# Patient Record
Sex: Female | Born: 1935 | Race: White | Hispanic: No | Smoking: Never smoker
Health system: Southern US, Community
[De-identification: ages and names within clinical notes are randomized; demographics above are authoritative.]

## PROBLEM LIST (undated history)

## (undated) DIAGNOSIS — K5790 Diverticulosis of intestine, part unspecified, without perforation or abscess without bleeding: Secondary | ICD-10-CM

## (undated) DIAGNOSIS — C801 Malignant (primary) neoplasm, unspecified: Secondary | ICD-10-CM

## (undated) DIAGNOSIS — J189 Pneumonia, unspecified organism: Secondary | ICD-10-CM

## (undated) DIAGNOSIS — D649 Anemia, unspecified: Secondary | ICD-10-CM

## (undated) DIAGNOSIS — I10 Essential (primary) hypertension: Secondary | ICD-10-CM

## (undated) DIAGNOSIS — E785 Hyperlipidemia, unspecified: Secondary | ICD-10-CM

## (undated) DIAGNOSIS — J302 Other seasonal allergic rhinitis: Secondary | ICD-10-CM

## (undated) DIAGNOSIS — K8020 Calculus of gallbladder without cholecystitis without obstruction: Secondary | ICD-10-CM

## (undated) DIAGNOSIS — M1990 Unspecified osteoarthritis, unspecified site: Secondary | ICD-10-CM

## (undated) DIAGNOSIS — Z973 Presence of spectacles and contact lenses: Secondary | ICD-10-CM

## (undated) DIAGNOSIS — F329 Major depressive disorder, single episode, unspecified: Secondary | ICD-10-CM

## (undated) DIAGNOSIS — F32A Depression, unspecified: Secondary | ICD-10-CM

## (undated) HISTORY — PX: ABDOMINAL HYSTERECTOMY: SHX81

## (undated) HISTORY — DX: Calculus of gallbladder without cholecystitis without obstruction: K80.20

## (undated) HISTORY — DX: Unspecified osteoarthritis, unspecified site: M19.90

## (undated) HISTORY — DX: Pneumonia, unspecified organism: J18.9

## (undated) HISTORY — PX: APPENDECTOMY: SHX54

## (undated) HISTORY — DX: Hyperlipidemia, unspecified: E78.5

## (undated) HISTORY — DX: Diverticulosis of intestine, part unspecified, without perforation or abscess without bleeding: K57.90

## (undated) HISTORY — PX: TONSILECTOMY, ADENOIDECTOMY, BILATERAL MYRINGOTOMY AND TUBES: SHX2538

---

## 2005-01-19 LAB — HM COLONOSCOPY: HM Colonoscopy: NORMAL

## 2006-01-29 LAB — HM COLONOSCOPY: HM Colonoscopy: NORMAL

## 2006-02-18 HISTORY — PX: COLONOSCOPY: SHX174

## 2006-05-13 ENCOUNTER — Ambulatory Visit: Payer: Self-pay | Admitting: Internal Medicine

## 2006-06-24 ENCOUNTER — Ambulatory Visit (HOSPITAL_COMMUNITY): Admission: RE | Admit: 2006-06-24 | Discharge: 2006-06-24 | Payer: Self-pay | Admitting: Family Medicine

## 2006-07-29 ENCOUNTER — Ambulatory Visit: Payer: Self-pay | Admitting: Internal Medicine

## 2007-06-26 ENCOUNTER — Ambulatory Visit (HOSPITAL_COMMUNITY): Admission: RE | Admit: 2007-06-26 | Discharge: 2007-06-26 | Payer: Self-pay | Admitting: Internal Medicine

## 2008-06-29 ENCOUNTER — Ambulatory Visit (HOSPITAL_COMMUNITY): Admission: RE | Admit: 2008-06-29 | Discharge: 2008-06-29 | Payer: Self-pay | Admitting: Internal Medicine

## 2009-06-30 ENCOUNTER — Ambulatory Visit (HOSPITAL_COMMUNITY): Admission: RE | Admit: 2009-06-30 | Discharge: 2009-06-30 | Payer: Self-pay | Admitting: Internal Medicine

## 2009-08-16 ENCOUNTER — Ambulatory Visit: Payer: Self-pay | Admitting: Internal Medicine

## 2010-07-04 ENCOUNTER — Ambulatory Visit (HOSPITAL_COMMUNITY): Admission: RE | Admit: 2010-07-04 | Discharge: 2010-07-04 | Payer: Self-pay | Admitting: Internal Medicine

## 2010-11-05 HISTORY — PX: CHOLECYSTECTOMY: SHX55

## 2011-06-12 ENCOUNTER — Other Ambulatory Visit: Payer: Self-pay | Admitting: Internal Medicine

## 2011-06-12 DIAGNOSIS — Z1231 Encounter for screening mammogram for malignant neoplasm of breast: Secondary | ICD-10-CM

## 2011-06-14 ENCOUNTER — Encounter: Payer: Self-pay | Admitting: Internal Medicine

## 2011-06-27 ENCOUNTER — Telehealth: Payer: Self-pay | Admitting: Internal Medicine

## 2011-06-27 NOTE — Telephone Encounter (Signed)
Spoke with patient. She says that she has had this stomach pain a couple of times over the past couple of weeks. No N/V/D. She says that she is okay with not having appt. Today and will call back to schedule next week.

## 2011-07-03 ENCOUNTER — Ambulatory Visit (INDEPENDENT_AMBULATORY_CARE_PROVIDER_SITE_OTHER): Payer: Medicare Other | Admitting: Internal Medicine

## 2011-07-03 ENCOUNTER — Encounter: Payer: Self-pay | Admitting: Internal Medicine

## 2011-07-03 DIAGNOSIS — R1013 Epigastric pain: Secondary | ICD-10-CM

## 2011-07-03 DIAGNOSIS — R030 Elevated blood-pressure reading, without diagnosis of hypertension: Secondary | ICD-10-CM

## 2011-07-03 DIAGNOSIS — I10 Essential (primary) hypertension: Secondary | ICD-10-CM | POA: Insufficient documentation

## 2011-07-03 NOTE — Progress Notes (Signed)
  Subjective:    Patient ID: Elaine Price, female    DOB: 1935/05/09, 76 y.o.   MRN: [id number]  HPI  76 yr old female with no PMH presents with recurrent abdominal radating to back  lasting 2-3 days accompanied by no change in bowel habits, no nausea, no bloating.  Pain is worse on the left upper side.  Two episodes in the last month , no prior travel or exercise.   Notices that stools have become darker lately but no weight loss or hematochezia; Review of Systems  Constitutional: Negative for fever, chills and unexpected weight change.  HENT: Negative for hearing loss, ear pain, nosebleeds, congestion, sore throat, facial swelling, rhinorrhea, sneezing, mouth sores, trouble swallowing, neck pain, neck stiffness, voice change, postnasal drip, sinus pressure, tinnitus and ear discharge.   Eyes: Negative for pain, discharge, redness and visual disturbance.  Respiratory: Negative for cough, chest tightness, shortness of breath, wheezing and stridor.   Cardiovascular: Negative for chest pain, palpitations and leg swelling.  Musculoskeletal: Negative for myalgias and arthralgias.  Skin: Negative for color change and rash.  Neurological: Negative for dizziness, weakness, light-headedness and headaches.  Hematological: Negative for adenopathy.       Objective:   Physical Exam  Constitutional: She is oriented to person, place, and time. She appears well-developed and well-nourished.  HENT:  Mouth/Throat: Oropharynx is clear and moist.  Eyes: EOM are normal. Pupils are equal, round, and reactive to light. No scleral icterus.  Neck: Normal range of motion. Neck supple. No JVD present. No thyromegaly present.  Cardiovascular: Normal rate, regular rhythm, normal heart sounds and intact distal pulses.   Pulmonary/Chest: Effort normal and breath sounds normal.  Abdominal: Soft. Bowel sounds are normal. She exhibits no mass. There is no tenderness.  Musculoskeletal: Normal range of motion. She  exhibits no edema.  Lymphadenopathy:    She has no cervical adenopathy.  Neurological: She is alert and oriented to person, place, and time.  Skin: Skin is warm and dry.  Psychiatric: She has a normal mood and affect.          Assessment & Plan:  1) Abdominal pain: her history is concernign for biliary colic vs gallstone pancreatitis vs PUD.  Will obtain abdominal ultrasound and LFTs.  I recommended empiric treatment for gastritis with PPI but she declined since she is not having daily symptoms.

## 2011-07-03 NOTE — Patient Instructions (Signed)
Your abdominal pain may be coming from gastritis or from gallstones.  We will call you with an appt for an abdominal ultrasound to be done at [facility].

## 2011-07-04 LAB — POCT URINALYSIS DIPSTICK
Blood, UA: NEGATIVE
Ketones, UA: NEGATIVE
Nitrite, UA: NEGATIVE
Protein, UA: NEGATIVE
Spec Grav, UA: 1.005
pH, UA: 6.5

## 2011-07-04 LAB — LIPASE: Lipase: 21 U/L (ref 11.0–59.0)

## 2011-07-04 NOTE — Progress Notes (Signed)
Addended by: Jobie Quaker on: 07/04/2011 02:49 PM   Modules accepted: Orders

## 2011-07-05 ENCOUNTER — Ambulatory Visit (HOSPITAL_COMMUNITY): Payer: Self-pay

## 2011-07-06 ENCOUNTER — Ambulatory Visit (HOSPITAL_COMMUNITY)
Admission: RE | Admit: 2011-07-06 | Discharge: 2011-07-06 | Disposition: A | Discharge status: Home / Self Care | Payer: Medicare Other | Source: Ambulatory Visit | Attending: Internal Medicine | Admitting: Internal Medicine

## 2011-07-06 ENCOUNTER — Telehealth: Payer: Self-pay | Admitting: Internal Medicine

## 2011-07-06 DIAGNOSIS — R1013 Epigastric pain: Secondary | ICD-10-CM | POA: Insufficient documentation

## 2011-07-06 DIAGNOSIS — Z1231 Encounter for screening mammogram for malignant neoplasm of breast: Secondary | ICD-10-CM

## 2011-07-06 DIAGNOSIS — K8020 Calculus of gallbladder without cholecystitis without obstruction: Secondary | ICD-10-CM | POA: Insufficient documentation

## 2011-07-06 NOTE — Progress Notes (Signed)
Addended by: Jobie Quaker on: 07/06/2011 04:25 PM   Modules accepted: Orders

## 2011-07-06 NOTE — Telephone Encounter (Signed)
Tried to call patient, she will call back when she gets in per patients husband.

## 2011-07-06 NOTE — Telephone Encounter (Signed)
Message copied by Edd Fabian on Fri Jul 06, 2011 12:00 PM ------      Message from: Duncan Dull      Created: Fri Jul 06, 2011 10:24 AM      Regarding: abdominal pain       Abraham's ultrasound was abnormal.  She has a 2 cm stone in her gallbladder and her common bile duct was dilated.  She also had a dilated pancreatic duct, which raises concern for a stricture or mass, so Dr. Fredia Sorrow has recommended another imaging study to determine this before she is referred to a surgeon .  She needs and MRI of the abdomen with contrast and MRCP.  I will place the order now.

## 2011-07-06 NOTE — Progress Notes (Signed)
Addended by: Duncan Dull on: 07/06/2011 10:32 AM   Modules accepted: Orders

## 2011-07-06 NOTE — Assessment & Plan Note (Signed)
Secondary to cholelithiasis with CBD and pancreatic duct dilation, GB wall thickening seen on u/s done at [facility] on Aug 31st.  MRI AND MRCP ordered.

## 2011-07-07 LAB — COMPREHENSIVE METABOLIC PANEL
AST: 15 U/L (ref 0–37)
BUN: 19 mg/dL (ref 6–23)
CO2: 26 mEq/L (ref 19–32)
Chloride: 103 mEq/L (ref 96–112)
Creat: 0.74 mg/dL (ref 0.50–1.10)
Potassium: 4.4 mEq/L (ref 3.5–5.3)
Sodium: 140 mEq/L (ref 135–145)

## 2011-07-10 ENCOUNTER — Other Ambulatory Visit: Payer: Self-pay | Admitting: *Deleted

## 2011-07-10 ENCOUNTER — Other Ambulatory Visit: Payer: Self-pay | Admitting: Internal Medicine

## 2011-07-10 MED ORDER — HYOSCYAMINE SULFATE 0.375 MG PO CP12: 1.0000 | ORAL_CAPSULE | Freq: Two times a day (BID) | ORAL | Status: DC | PRN

## 2011-07-11 NOTE — Telephone Encounter (Signed)
Opened in error

## 2011-07-13 ENCOUNTER — Inpatient Hospital Stay (HOSPITAL_COMMUNITY): Admission: RE | Admit: 2011-07-13 | Payer: Medicare Other | Source: Ambulatory Visit

## 2011-07-16 ENCOUNTER — Telehealth: Payer: Self-pay | Admitting: Internal Medicine

## 2011-07-16 ENCOUNTER — Ambulatory Visit (HOSPITAL_COMMUNITY): Admission: RE | Admit: 2011-07-16 | Payer: Medicare Other | Source: Ambulatory Visit

## 2011-07-16 ENCOUNTER — Other Ambulatory Visit: Payer: Self-pay | Admitting: Internal Medicine

## 2011-07-16 MED ORDER — DIAZEPAM 2 MG PO TABS: 2.0000 mg | ORAL_TABLET | Freq: Three times a day (TID) | ORAL | Status: DC | PRN

## 2011-07-16 NOTE — Telephone Encounter (Signed)
Rx has been called in, and patient notified of the instructions.

## 2011-07-16 NOTE — Telephone Encounter (Signed)
Cell number [phone]  Pt called to say that she could not do the mri today @ [facility].  When she got in the machine she was clostrophic and could only stay in there about 2 min.  Pt wanted to know what to do now.

## 2011-07-16 NOTE — Telephone Encounter (Signed)
Patient's husband called and said that they were not able to do the MRI today at [facility] because patient started screaming and crying and was so claustrophobic that she could not breathe. He is home with her now and she is still crying and says that she still feels like she is in a closed tunnel. He is asking if she could have something called in to help her calm down. [facility]ses [facility]

## 2011-07-16 NOTE — Telephone Encounter (Signed)
Certainly.  Please call her in an rx for valium (diazepam) 2 mg one tablet every 8 hours as needed for anxiety. Qty #10.   She can take one now, so she knows how it makes her feel.  It is a low dose so she may need to take 2 prior to her MRI  to combat the claustrophobia. when her MRI is rescheduled she should take one tablet 2 hours before the MRI and another 1 hour before if still anxious.

## 2011-07-17 ENCOUNTER — Telehealth: Payer: Self-pay | Admitting: Internal Medicine

## 2011-07-17 NOTE — Telephone Encounter (Signed)
I gave her a really low dose because she does not use these kinds of meds beforee.  Ask her try increase the dose to  2.5 tablets (5 mg total) and repeat in one hour if no effect,  Her husband will need to drive her to her appt.

## 2011-07-17 NOTE — Telephone Encounter (Signed)
Patient called and stated she tried the valium you prescribed yesterday to see how it effects her.  She stated she did not feel sedated so after an hour of taking the first pill, she took another one.  Patient stated after taking the second pill she still did not feel anything at all.  She wanted you to know that it probably would not work when she tried to do another MRI.  Please advise.

## 2011-07-18 ENCOUNTER — Telehealth: Payer: Self-pay | Admitting: *Deleted

## 2011-07-18 NOTE — Telephone Encounter (Signed)
Patient is asking if you can call her at your convenience. She says that it is really important since she was unable to do the MRI. I offered to set her up an appt, but she refused. While I was speaking with her she sounded very confused. She said that she didn't want to give me anymore information and that she only wanted to speak with you.

## 2011-07-18 NOTE — Telephone Encounter (Signed)
patienr was called and instructed to takr 4 mg of valium today as a test.  She will call back to let us know if she tolerate it.  whe will need a new rx for valium 5mg   One talbet twice daily asneeded for anxiety  #30 no refills called to pharmacy IF she tolerated the 4 mg dose.

## 2011-07-19 NOTE — Telephone Encounter (Signed)
Dr. Darrick Huntsman spoke with patient regarding valium.

## 2011-07-20 ENCOUNTER — Other Ambulatory Visit: Payer: Self-pay | Admitting: Internal Medicine

## 2011-07-20 ENCOUNTER — Other Ambulatory Visit: Payer: Medicare Other

## 2011-07-20 ENCOUNTER — Telehealth: Payer: Self-pay | Admitting: Internal Medicine

## 2011-07-20 DIAGNOSIS — R1013 Epigastric pain: Secondary | ICD-10-CM

## 2011-07-20 DIAGNOSIS — Z1289 Encounter for screening for malignant neoplasm of other sites: Secondary | ICD-10-CM

## 2011-07-20 DIAGNOSIS — R030 Elevated blood-pressure reading, without diagnosis of hypertension: Secondary | ICD-10-CM

## 2011-07-23 NOTE — Telephone Encounter (Signed)
Patient says that the [facility] has reccommended diazepam 10 mg instead of 2 mg to take before her test on 07-27-11. She is asking if she can get a rx called to [facility] dr.

## 2011-07-24 MED ORDER — DIAZEPAM 10 MG PO TABS: ORAL_TABLET | ORAL | Status: DC

## 2011-07-24 NOTE — Telephone Encounter (Signed)
Rx called to pharmacy

## 2011-07-24 NOTE — Telephone Encounter (Signed)
Fine. Call her in dizepam 10 mg one tablet one hour prior to MRI  #qty #4 no reflls

## 2011-07-31 ENCOUNTER — Other Ambulatory Visit (INDEPENDENT_AMBULATORY_CARE_PROVIDER_SITE_OTHER): Payer: Medicare Other | Admitting: *Deleted

## 2011-07-31 ENCOUNTER — Telehealth: Payer: Self-pay | Admitting: Internal Medicine

## 2011-07-31 DIAGNOSIS — Z79899 Other long term (current) drug therapy: Secondary | ICD-10-CM

## 2011-07-31 DIAGNOSIS — K8010 Calculus of gallbladder with chronic cholecystitis without obstruction: Secondary | ICD-10-CM

## 2011-07-31 NOTE — Telephone Encounter (Signed)
Elaine Price,  Elaine Price needs a referral to [facility] for a cholecystectomy for history of cholecystitis.  She has had an ultrasound done at [facility] followed by an MRCP at [facility],  And will need to take that one a a CD to her visit.

## 2011-07-31 NOTE — Telephone Encounter (Signed)
Message copied by Edd Fabian on Tue Jul 31, 2011  4:08 PM ------      Message from: Duncan Dull      Created: Mon Jul 30, 2011  6:22 PM      Regarding: MRI /MRCP Abdomen       Elaine Price's MRCP showed gallstones but nothing blocking the bile duct or the pacreatic duct, This means that we should refer her to a general surgeon to get her gallbladder out.  Where does seh want to have it done?  And does she have a preference regarding surgeons?  Should get it done before she has another episode.  We also talked about repeating her CMET

## 2011-07-31 NOTE — Telephone Encounter (Signed)
Patient came into the office requesting her results.  I notified patient of results and she stated she does not have a preference on a surgeon she just wants to go through [facility].  Carlyon Shadow. drew a CMET for the repeat test you requested while patient was here.

## 2011-08-01 LAB — COMPREHENSIVE METABOLIC PANEL
ALT: 16 U/L (ref 0–35)
Alkaline Phosphatase: 90 U/L (ref 39–117)
Chloride: 104 mEq/L (ref 96–112)
Potassium: 4 mEq/L (ref 3.5–5.1)
Sodium: 141 mEq/L (ref 135–145)
Total Bilirubin: 0.3 mg/dL (ref 0.3–1.2)

## 2011-08-02 ENCOUNTER — Ambulatory Visit (INDEPENDENT_AMBULATORY_CARE_PROVIDER_SITE_OTHER): Payer: Medicare Other | Admitting: General Surgery

## 2011-08-02 ENCOUNTER — Encounter (INDEPENDENT_AMBULATORY_CARE_PROVIDER_SITE_OTHER): Payer: Self-pay | Admitting: General Surgery

## 2011-08-02 VITALS — BP 142/86 | HR 78 | Temp 96.8°F | Resp 16 | Ht 67.0 in | Wt 174.2 lb

## 2011-08-02 DIAGNOSIS — K8020 Calculus of gallbladder without cholecystitis without obstruction: Secondary | ICD-10-CM

## 2011-08-02 DIAGNOSIS — K811 Chronic cholecystitis: Secondary | ICD-10-CM | POA: Insufficient documentation

## 2011-08-02 NOTE — Patient Instructions (Signed)
You have gallstones, and it is highly likely that your pain is due to gallbladder disease. We will need to review the MRCP, but reportedly that is normal. There liver function tests are normal. We will schedule you for surgery.  Dr. Karie Soda will perform a  laparoscopic cholecystectomy with cholangiogram.

## 2011-08-02 NOTE — Progress Notes (Addendum)
Chief Complaint  Patient presents with  . Other    Eval cholecystitis and gallstones    HPI Elaine Price is a 76 y.o. female.    This woman was referred to me by Dr. Junie Panning in [place]. She is referred for management of symptomatic gallstones.  The patient is very healthy. She has minimum medical problems. She is physically active. 2 months ago she developed an episode of upper bowel pain radiating into her back. There is no nausea or vomiting but she does get anorexic during the pain attacks. She had another attack about 2 weeks later. She is now having these more frequently. She denies any reflux symptoms. Denies weight loss. She is currently eating just fine. Her bowel movements are just fine. He denies reflux symptoms.  Ultrasound performed in the common system shows a single 2 cm gallstone and mild gallbladder wall thickening. The noted common bile duct a 9 mm and a prominent pancreatic duct and they advised further imaging to rule out ampullary mass. She socially had an MRCP in [place] which is reportedly normal and I'm still waiting for that report. Liver function tests on July 31, 2011 are normal. She is in no distress today.  She states that she was referred because she needs her gallbladder to be removed.  Her only significant history is abdominal hysterectomy and appendectomy through a Pfannenstiel incision 1998. She had a colonoscopy 5 years ago with no significant abnormalities. She is active and swims every other day.  Because I would be out of town for the next 9 days, I have referred her to Dr. Karie Soda who met the patient interviewed her today. He plans to schedule her surgery next week. This was completely acceptable with the patient  .HPI  History reviewed. No pertinent past medical history.  Past Surgical History  Procedure Date  . Abdominal hysterectomy y-17  . Tonsilectomy, adenoidectomy, bilateral myringotomy and tubes     as a child, does  not remember date  . Appendectomy y-17    Family History  Problem Relation Age of Onset  . Stroke Mother   . Cancer Father     prostate  . Cancer Brother     lung  . Cancer Brother     prostate    Social History History  Substance Use Topics  . Smoking status: Never Smoker   . Smokeless tobacco: Never Used  . Alcohol Use: Yes     rare    No Known Allergies  Current Outpatient Prescriptions  Medication Sig Dispense Refill  . aspirin 81 MG tablet Take 81 mg by mouth daily.        . Calcium Carbonate-Vit D-Min (CALCIUM 1200) 1200-1000 MG-UNIT CHEW Chew 1 tablet by mouth daily.        . cetirizine (ZYRTEC) 10 MG tablet Take 10 mg by mouth daily.        . diazepam (VALIUM) 10 MG tablet 1 tablet one hour prior to MRI.  4 tablet  0  . diazepam (VALIUM) 2 MG tablet Take 1 tablet (2 mg total) by mouth every 8 (eight) hours as needed for anxiety.  10 tablet  0  . Hyoscyamine Sulfate 0.375 MG CP12 Take 1 capsule (0.375 mg total) by mouth 2 (two) times daily as needed.  60 each  0    Review of Systems Review of Systems  Constitutional: Negative.   HENT: Negative.   Eyes: Negative.   Respiratory: Negative.   Cardiovascular: Negative.   Gastrointestinal:  Positive for abdominal pain. Negative for nausea, vomiting, diarrhea, constipation, blood in stool, abdominal distention, anal bleeding and rectal pain.  Genitourinary: Negative.   Musculoskeletal: Positive for back pain. Negative for myalgias, joint swelling, arthralgias and gait problem.  Skin: Negative.   Neurological: Negative.   Psychiatric/Behavioral: Negative.     Blood pressure 142/86, pulse 78, temperature 96.8 F (36 C), temperature source Temporal, resp. rate 16, height 5\' 7"  (1.702 m), weight 174 lb 3.2 oz (79.017 kg).  Physical Exam Physical Exam  Constitutional: She is oriented to person, place, and time. She appears well-developed and well-nourished. No distress.       Husband with her throughout the  encounter  HENT:  Head: Normocephalic and atraumatic.  Nose: Nose normal.  Mouth/Throat: No oropharyngeal exudate.  Eyes: Conjunctivae and EOM are normal. Pupils are equal, round, and reactive to light. Right eye exhibits no discharge. Left eye exhibits no discharge. No scleral icterus.  Neck: Normal range of motion. Neck supple. No JVD present. No tracheal deviation present. No thyromegaly present.  Cardiovascular: Normal rate, regular rhythm and intact distal pulses.   No murmur heard. Pulmonary/Chest: Effort normal and breath sounds normal. No respiratory distress. She has no wheezes. She has no rales. She exhibits no tenderness.  Abdominal: Soft. Bowel sounds are normal. She exhibits no distension and no mass. There is no tenderness. There is no rebound and no guarding.  Musculoskeletal: Normal range of motion. She exhibits no edema and no tenderness.  Lymphadenopathy:    She has no cervical adenopathy.  Neurological: She is alert and oriented to person, place, and time. She exhibits normal muscle tone.  Skin: Skin is warm and dry. No rash noted. She is not diaphoretic. No erythema. No pallor.  Psychiatric: She has a normal mood and affect. Her behavior is normal. Judgment and thought content normal.    Data Reviewed I have reviewed her ultrasound report. I have requested the MRCP report. I reviewed her lab work recently. I reviewed the office notes from [facility]. I have presented the case to  Dr. Viviann Spare gross and have coordinated her care with him. Dr. Michaell Cowing met the patien, interviewed her and her husband. He agreed with the plan for surgery. Assessment    Chronic cholecystitis with cholelithiasis. I believe that she is having repeated episodes of biliary colic.  Ultrasound showing dilated common bile duct in the setting of normal liver function tests and reportedly normal MRCP. Given this evaluation, I think the chance of periampullary or pancreatic disease is low. The patient is  aware that this is still possible, however.  Status post abdominal hysterectomy, nephrectomy and appendectomy 1998.  Last colonoscopy 5 years ago.    Plan    We will obtain the report of the MRCP preop.  She'll be scheduled for laparoscopic cholecystectomy with cholangiogram. Dr. Karie Soda will be performing the surgery next week.  I have discussed the indications and details of surgery with her. Risks and complications have been outlined, including but not limited to bleeding, infection, conversion to open laparotomy, injury to adjacent organs such the main bile duct or intestine was major reconstructive surgery, bile leak, cardiac pulmonary and thromboembolic problems. She is aware of the fact that there may be another disease process. .At this time all of her questions are answered. She seems to understand all these issues well. She and her husband are in agreement with this plan.       Johnanthony Wilden M 08/02/2011, 9:21 AM  I saw the  patient with Dr. Derrell Lolling in the patient's husband. Her symptoms are somewhat atypical but the rest of the different differential diagnosis seems not very likely. She had a normal colonoscopy. No history of alcohol use. No history of pancreatitis. No history of ulcers. No heavy and said he used. No history of inflammatory bowel disease or irritable bowel syndrome. Otherwise excellent exercise tolerance. No history of cardiopulmonary events. We did discuss the possibility of seeing gastroenterology first. In that way we could rule out any other forgut etiology. With the MRCP report port negative, I feel comfortable offering surgery first. The patient has been agreed to proceed with surgery.  I think she a good candidate to start with a single site approach.  The anatomy & physiology of hepatobiliary & pancreatic function was discussed.  The pathophysiology of gallbladder dysfunction was discussed.  Natural history risks without surgery was discussed.   I feel  the risks of no intervention will lead to serious problems that outweigh the operative risks; therefore, I recommended cholecystectomy to remove the pathology.  I explained laparoscopic techniques with possible need for an open approach.  Probable cholangiogram to evaluate the bilary tract was explained as well.    Risks such as bleeding, infection, abscess, leak, injury to other organs, need for further treatment, heart attack, death, and other risks were discussed.  Possibility that this will not correct all abdominal symptoms was explained.  Goals of post-operative recovery were discussed as well.  We will work to minimize complications.  An educational handout further explaining the pathology and treatment options was given as well.  Questions were answered.  The patient expresses understanding & wishes to proceed with surgery.

## 2011-08-03 NOTE — Telephone Encounter (Signed)
Patient had an appointment at [facility] for Thursday Sept 27, 2012 at 8:30. She was giving this appointment before she left our office

## 2011-08-06 ENCOUNTER — Ambulatory Visit (HOSPITAL_COMMUNITY)
Admission: RE | Admit: 2011-08-06 | Discharge: 2011-08-06 | Disposition: A | Discharge status: Home / Self Care | Payer: Medicare Other | Source: Ambulatory Visit | Attending: Surgery | Admitting: Surgery

## 2011-08-06 ENCOUNTER — Other Ambulatory Visit (INDEPENDENT_AMBULATORY_CARE_PROVIDER_SITE_OTHER): Payer: Self-pay | Admitting: Surgery

## 2011-08-06 ENCOUNTER — Ambulatory Visit (HOSPITAL_COMMUNITY): Payer: Medicare Other

## 2011-08-06 DIAGNOSIS — Z01812 Encounter for preprocedural laboratory examination: Secondary | ICD-10-CM | POA: Insufficient documentation

## 2011-08-06 DIAGNOSIS — K8010 Calculus of gallbladder with chronic cholecystitis without obstruction: Secondary | ICD-10-CM | POA: Insufficient documentation

## 2011-08-06 DIAGNOSIS — Z7982 Long term (current) use of aspirin: Secondary | ICD-10-CM | POA: Insufficient documentation

## 2011-08-06 DIAGNOSIS — Z79899 Other long term (current) drug therapy: Secondary | ICD-10-CM | POA: Insufficient documentation

## 2011-08-06 HISTORY — PX: CHOLECYSTECTOMY, LAPAROSCOPIC: SHX56

## 2011-08-06 LAB — CBC
HCT: 39.9 % (ref 36.0–46.0)
MCH: 28.9 pg (ref 26.0–34.0)
MCHC: 32.1 g/dL (ref 30.0–36.0)
RBC: 4.43 MIL/uL (ref 3.87–5.11)
RDW: 13.3 % (ref 11.5–15.5)
WBC: 9 10*3/uL (ref 4.0–10.5)

## 2011-08-06 LAB — SURGICAL PCR SCREEN
MRSA, PCR: NEGATIVE
Staphylococcus aureus: NEGATIVE

## 2011-08-17 ENCOUNTER — Telehealth: Payer: Self-pay | Admitting: Internal Medicine

## 2011-08-17 NOTE — Telephone Encounter (Signed)
I called patient's insurance company at [phone] and spoke with Elaine Price she states that the insurance did pay for the patient's claim on the 21st but she couldn't tell me what the claim was on.  I called Elaine Price back and advised her to call them back with the claim information in front of her so she can get this straight.

## 2011-08-20 ENCOUNTER — Ambulatory Visit (INDEPENDENT_AMBULATORY_CARE_PROVIDER_SITE_OTHER): Payer: Medicare Other | Admitting: Surgery

## 2011-08-20 ENCOUNTER — Encounter (INDEPENDENT_AMBULATORY_CARE_PROVIDER_SITE_OTHER): Payer: Self-pay | Admitting: Surgery

## 2011-08-20 VITALS — BP 138/74 | HR 60 | Temp 97.2°F | Resp 20 | Ht 67.0 in | Wt 174.4 lb

## 2011-08-20 DIAGNOSIS — K811 Chronic cholecystitis: Secondary | ICD-10-CM

## 2011-08-20 NOTE — Progress Notes (Signed)
Subjective:     Patient ID: Elaine Price, female   DOB: Feb 06, 1935, 76 y.o.   MRN: [id number]  HPI  Patient Care Team: Duncan Dull, MD as PCP - General (Internal Medicine)  This patient is a 76 y.o.female who presents today for surgical evaluation.   Procedure: Single site laparoscopic cholecystectomy and cholangiogram 08/08/2011  The patient comes in today feeling well. Was off pain medicines in 1 day. Eating well. No nausea or vomiting.  She did have a scare last week where she had an attack with pain going from her right upper quadrant to her back. It faded away over a day. No problems since. No nausea or vomiting. No heartburn. No reflux.  History reviewed. No pertinent past medical history.  Past Surgical History  Procedure Date  . Abdominal hysterectomy y-17  . Tonsilectomy, adenoidectomy, bilateral myringotomy and tubes     as a child, does not remember date  . Appendectomy y-17  . Gallbladder surgery     gallstones    History   Social History  . Marital Status: Married    Spouse Name: N/A    Number of Children: N/A  . Years of Education: N/A   Occupational History  . Not on file.   Social History Main Topics  . Smoking status: Never Smoker   . Smokeless tobacco: Never Used  . Alcohol Use: Yes     rare  . Drug Use: No  . Sexually Active: Not on file   Other Topics Concern  . Not on file   Social History Narrative  . No narrative on file    Family History  Problem Relation Age of Onset  . Stroke Mother   . Cancer Father     prostate  . Cancer Brother     lung  . Cancer Brother     prostate    Current outpatient prescriptions:aspirin 81 MG tablet, Take 81 mg by mouth daily.  , Disp: , Rfl: ;  Calcium Carbonate-Vit D-Min (CALCIUM 1200) 1200-1000 MG-UNIT CHEW, Chew 1 tablet by mouth daily.  , Disp: , Rfl: ;  cetirizine (ZYRTEC) 10 MG tablet, Take 10 mg by mouth daily.  , Disp: , Rfl:   No Known Allergies     Review of Systems    Constitutional: Negative for fever, chills and diaphoresis.  HENT: Negative for ear pain, sore throat and trouble swallowing.   Eyes: Negative for photophobia and visual disturbance.  Respiratory: Negative for cough and choking.   Cardiovascular: Negative for chest pain, palpitations and leg swelling.  Gastrointestinal: Negative for nausea, vomiting, diarrhea, constipation, blood in stool, abdominal distention, anal bleeding and rectal pain.  Genitourinary: Negative for dysuria, urgency, frequency, difficulty urinating and pelvic pain.  Musculoskeletal: Negative for myalgias and gait problem.  Skin: Negative for color change, pallor, rash and wound.  Neurological: Negative for dizziness, speech difficulty, weakness and numbness.  Hematological: Negative for adenopathy.  Psychiatric/Behavioral: Negative for confusion and agitation. The patient is not nervous/anxious.        Objective:   Physical Exam  Constitutional: She is oriented to person, place, and time. She appears well-developed and well-nourished. No distress.  HENT:  Head: Normocephalic.  Mouth/Throat: Oropharynx is clear and moist. No oropharyngeal exudate.  Eyes: Conjunctivae and EOM are normal. Pupils are equal, round, and reactive to light. No scleral icterus.  Neck: Normal range of motion. No tracheal deviation present.  Cardiovascular: Normal rate and intact distal pulses.   Pulmonary/Chest: Effort normal. No respiratory distress.  She exhibits no tenderness.  Abdominal: Soft. She exhibits no distension and no mass. There is no tenderness. There is no rebound and no guarding. Hernia confirmed negative in the right inguinal area and confirmed negative in the left inguinal area.       Incisions clean with normal healing ridge.  No hernias  Genitourinary: No vaginal discharge found.  Musculoskeletal: Normal range of motion. She exhibits no tenderness.  Lymphadenopathy:       Right: No inguinal adenopathy present.        Left: No inguinal adenopathy present.  Neurological: She is alert and oriented to person, place, and time. No cranial nerve deficit. She exhibits normal muscle tone. Coordination normal.  Skin: Skin is warm and dry. No rash noted. She is not diaphoretic.  Psychiatric: She has a normal mood and affect. Her behavior is normal.       Assessment:     12 days from a laparoscopic cholecystectomy. Recovering well    Plan:     Increase activity as tolerated.  Do not push through pain.  Advanced on diet as tolerated. Bowel regimen to avoid problems.  Hopefully, this one episode of pain was more of an aftershock. If the pain persists or worsens, she may need reevaluation. She does not look like someone who has a leak or abscess to me. I reviewed her MRCP and her cholangiogram and her pathology to her & her husband. She and her husband feel reassured. I noted if she has another attack or worsening symptoms, please call me and we may need to do more workup.  Return to clinic p.r.n. The patient expressed understanding and appreciation

## 2011-08-23 NOTE — Op Note (Signed)
Elaine Price, Elaine Price             ACCOUNT NO.:  [id number]  MEDICAL RECORD NO.:  [id number]  LOCATION:  DAYL                         FACILITY:  [facility]  PHYSICIAN:  Ardeth Sportsman, MD     DATE OF BIRTH:  Mar 16, 1935  DATE OF PROCEDURE:  08/06/2011 DATE OF DISCHARGE:  08/06/2011                              OPERATIVE REPORT   PRIMARY CARE PHYSICIAN:  Duncan Dull, MD  SURGEON:  Angelia Mould. Derrell Lolling, MD  OPERATING SURGEON:  Ardeth Sportsman, MD  ASSISTANT:  RN.  PREOPERATIVE DIAGNOSES: 1. Cholecystolithiasis. 2. Dilated common bile duct. 3. Probable chronic cholecystitis.  POSTOPERATIVE DIAGNOSES: 1. Cholecystolithiasis. 2. Dilated common bile duct. 3. Probable chronic cholecystitis.  PROCEDURE PERFORMED:  Laparoscopic cholecystectomy with intraoperative cholangiogram (single-site technique).  SPECIMEN:  Gallbladder.  DRAINS:  None.  ESTIMATED BLOOD LOSS:  Minimal.  COMPLICATIONS:  None apparent.  INDICATIONS:  Ms. Penninger is a 76 year old female who has struggled with intermittent abdominal pain, this started about 2 months ago.  She has had a few more attacks.  She had an ultrasound, which showed a single large gallstone and some common bile duct dilation.  MRCP revealed no other abnormalities.  Liver function tests were normal.  She was sent out to Dr. Claud Kelp for consideration of cholecystectomy. He felt she was an appropriate candidate.  Given her accelerating symptoms, she wished to do it as soon as possible.  Due to the fact that he was going out of town, he requested that I assume care of the patient and I did.  The anatomy and physiology of hepatobiliary and pancreatic function was discussed.  Pathophysiology of cholecystolithiasis with its natural history and risks were discussed.  Options discussed.  Recommendation made for laparoscopic cholecystectomy.  I think she is a reasonable candidate to start up for a single-site technique.  Given the  dilated common bile duct, I recommended cholangiogram as well.  Techniques, risks, benefits, and alternatives were discussed.  Questions answered and she and her husband agreed to proceed.  OPERATIVE FINDINGS:  She had a rather dilated gallbladder consistent with at least chronic if not acute on chronic cholecystitis.  She had some adhesions near the area suspicious for it as well.  She had a large gallstone at the neck of the gallbladder.  Her cholangiogram was moderately dilated, but there were no filling defects or obvious strictures.  DESCRIPTION OF PROCEDURE:  Informed consent was confirmed.  The patient voided prior to coming to the operating room.  She underwent general anesthesia without any difficulty.  She was positioned supine with arms tucked.  Her abdomen was prepped and draped in the sterile fashion. Surgical time-out confirmed our plan.  I made a transverse supraumbilical curvilinear incision.  I did a cutdown and placed a #5 mm port in the supraumbilical fascia using a modified Hassan-like cutdown technique.  Entry was clean.  I induced carbon dioxide insufflation.  Camera inspection revealed no injury. There were no adhesions to the anterior abdominal wall.  I therefore proceeded with single-site technique.  I placed a #5 mm port in the left upper aspect of the wound.  I placed a 5 mm grasper in the right inferior aspect of  the wound.  I went to clamp the gallbladder, but it was very dilated and turgid. The grasper actually broke when I tried to grab it.  I removed it out intact and inspected it.  There was a break in one of the springs, I placed another one in without any difficulty.  I went ahead and decompressed the gallbladder using an aspiration needle hooked up to laparoscopic suction under direct visualization.  I aspirated some clear bile.  I grasped the dome of the gallbladder and elevated it cephalad. I freed some adhesions to the gallbladder anteriorly.   I freed the peritoneal coverings between the gallbladder and the liver, and the posterolateral wall and the anterior and medial wall as well.  I could identify a dominant cystic artery.  I did a skeletonization and got a good 360 critical view with that.  I ligated it using ultrasonic dissection.  I did further dissection to free the proximal half of the gallbladder from the liver bed to get a good critical view.  I skeletonized the attachments until there was just the infundibulum going down to the cystic duct.  It was rather short about 2.5 cm.  I placed a clip on the infundibulum and did a partial cystic ductotomy. I got clear bile.  I milked back from the common bile duct and had numerous stones.  I placed a #5 [place] cholangiocatheter through a puncture site and placed in the cystic duct.  I ran a cholangiogram using dilute radio-opaque contrast and continuous fluoroscopy.  Contrast flowed from a dilated side branch consistent with cystic duct cannulation.  It flowed down the common bile duct across the normal ampulla into the duodenum easily.  It was slow to reflux up the common hepatic duct, but gradually it did.  Seemed like she had a rather dominant left-sided system.  However there was a branch coming up intrahepatically that went over up to the right side.  While it was a little atypical intrahepatically, it did light up both right and left secondary and territory radicles.  I removed the cholangiogram catheter.  I confirmed that again, I had an excellent critical view and the only tube going from the gallbladder down to the common bile duct with no abnormalities.  I placed 4 clips on the cystic duct, closed to the infundibulum and completed cystic duct transection.  I freed the liver from its remaining attachments on the liver bed.  I did spatula tip cautery to assure some hemostasis.  She was rather intrahepatic near the edge, so I did have to take a little bit of the  liver to get it off.  Because I punctured the gallbladder, placed in EndoCatch bag, and removed it out with a laparoscopic bag.  I did reinspection and hemostasis was excellent.  I did copious irrigation, with good return. Liver, colon, greater omentum, and duodenum sweep otherwise were normal without any other abnormalities.  I evacuated carbon-dioxide and removed the ports.  I closed the fascia using 0 Vicryl interrupted sutures.  I closed the skin with Monocryl.  Sterile dressing was applied.  The patient is being extubated and being sent to the recovery room in stable condition.  I had discussed postoperative care with the patient and her husband in our office.  I discussed it with her in the holding area.  I have written instructions.  I am about to discuss it with the family again.     Ardeth Sportsman, MD     SCG/MEDQ  D:  08/06/2011  T:  08/06/2011  Job:  [id number]  cc:   Duncan Dull, M.D. Fax: [phone]  Angelia Mould. Derrell Lolling, M.D. [address], [address] [place] [address]  Electronically Signed by Karie Soda MD on 08/23/2011 11:08:04 AM

## 2011-10-02 ENCOUNTER — Encounter (INDEPENDENT_AMBULATORY_CARE_PROVIDER_SITE_OTHER): Payer: Self-pay | Admitting: Surgery

## 2011-10-02 ENCOUNTER — Ambulatory Visit (INDEPENDENT_AMBULATORY_CARE_PROVIDER_SITE_OTHER): Payer: Medicare Other | Admitting: Surgery

## 2011-10-02 VITALS — BP 160/82 | HR 60 | Temp 96.8°F | Resp 16 | Ht 67.0 in | Wt 177.0 lb

## 2011-10-02 DIAGNOSIS — M546 Pain in thoracic spine: Secondary | ICD-10-CM

## 2011-10-02 DIAGNOSIS — K8010 Calculus of gallbladder with chronic cholecystitis without obstruction: Secondary | ICD-10-CM | POA: Insufficient documentation

## 2011-10-02 DIAGNOSIS — R1013 Epigastric pain: Secondary | ICD-10-CM

## 2011-10-02 LAB — COMPREHENSIVE METABOLIC PANEL
AST: 23 U/L (ref 0–37)
Alkaline Phosphatase: 103 U/L (ref 39–117)
BUN: 18 mg/dL (ref 6–23)
CO2: 26 mEq/L (ref 19–32)
Calcium: 10 mg/dL (ref 8.4–10.5)
Creat: 0.74 mg/dL (ref 0.50–1.10)
Glucose, Bld: 86 mg/dL (ref 70–99)
Potassium: 4.4 mEq/L (ref 3.5–5.3)
Sodium: 140 mEq/L (ref 135–145)
Total Bilirubin: 0.3 mg/dL (ref 0.3–1.2)
Total Protein: 7.3 g/dL (ref 6.0–8.3)

## 2011-10-02 LAB — CBC WITH DIFFERENTIAL/PLATELET
Hemoglobin: 13 g/dL (ref 12.0–15.0)
Monocytes Relative: 6 % (ref 3–12)
Platelets: 381 10*3/uL (ref 150–400)
RDW: 13.6 % (ref 11.5–15.5)

## 2011-10-02 NOTE — Progress Notes (Signed)
Subjective:     Patient ID: Elaine Price, female   DOB: 18-Feb-1935, 76 y.o.   MRN: [id number]  HPI  Elaine Price  12-17-1934 [id number]  Patient Care Team: Duncan Dull, MD as PCP - General (Internal Medicine)  This patient is a 75 y.o.female who presents today for surgical evaluation at the request of Dr. Darrick Huntsman.   Diagnosis: Abdominal and back pain with gallstones  Procedure: Single site laparoscopic cholecystectomy and intraoperative cholangiogram 08/06/2011  Pathology: Chronic cholecystitis and cholelithiasis.  Reason for visit: Recurrent symptoms.  Patient notes that she is getting intermittent pains again. It seems to be primarily epigastric. It will radiate between her shoulder blades at her upper thoracic back. Sometimes is more diffuse and vague. She notes it's uncomfortable to lie in her stomach due to belly button pain.   She'll use heat and that helps. She will take Norco that helps. She thinks she has used 20 Norco pills in 7 weeks. She tried Aleve once, but otherwise avoids NSAIDs.  She used to use TUMS and Rolaids with these attacks. She is not tried it since surgery. It never really worked before.   She noticed the past few days she does not feel like eating. She is having daily bowel movements. She continues taking daily fiber supplement in the morning. Pains last for a  while. They do seem to eventually go away. She feels like these are the same symptoms that she had prior to having her gallbladder removed. She felt like she had a week or 2 were things are getting better but she wonders if it all has come back again.  No nausea/vomiting.  No fevers/chills/sweats.  She denies any true heartburn or reflux. Symptoms are not worse necessarily with eating. She can eat spicy and fatty foods without difficulty. She could lie down flat without symptoms. She can bend over without symptoms. No dysphagia to solids and liquids. Pain doesn't go away with eating.  She is back to  exercising rather regularly. She does note that she can go through her whole low impact routine without difficulty. However, she will start feeling some upper back pain and soreness a few hours later. She is not back to the more intensive routine that she used to do before surgery. She has not transitioned back up to that yet.  History reviewed. No pertinent past medical history.  Past Surgical History  Procedure Date  . Abdominal hysterectomy y-17  . Tonsilectomy, adenoidectomy, bilateral myringotomy and tubes     as a child, does not remember date  . Appendectomy y-17  . Gallbladder surgery     gallstones    History   Social History  . Marital Status: Married    Spouse Name: N/A    Number of Children: N/A  . Years of Education: N/A   Occupational History  . Not on file.   Social History Main Topics  . Smoking status: Never Smoker   . Smokeless tobacco: Never Used  . Alcohol Use: Yes     rare  . Drug Use: No  . Sexually Active: Not on file   Other Topics Concern  . Not on file   Social History Narrative  . No narrative on file    Family History  Problem Relation Age of Onset  . Stroke Mother   . Cancer Father     prostate  . Cancer Brother     lung  . Cancer Brother     prostate    Current outpatient  prescriptions:aspirin 81 MG tablet, Take 81 mg by mouth daily.  , Disp: , Rfl: ;  Calcium Carbonate-Vit D-Min (CALCIUM 1200) 1200-1000 MG-UNIT CHEW, Chew 1 tablet by mouth daily.  , Disp: , Rfl: ;  cetirizine (ZYRTEC) 10 MG tablet, Take 10 mg by mouth daily.  , Disp: , Rfl:   No Known Allergies  BP 160/82  Pulse 60  Temp(Src) 96.8 F (36 C) (Temporal)  Resp 16  Ht 5\' 7"  (1.702 m)  Wt 177 lb (80.287 kg)  BMI 27.72 kg/m2     Review of Systems  Constitutional: Negative for fever, chills, diaphoresis, appetite change and fatigue.  HENT: Negative for ear pain, sore throat, trouble swallowing, neck pain and ear discharge.   Eyes: Negative for photophobia,  discharge and visual disturbance.  Respiratory: Negative for cough, choking, chest tightness and shortness of breath.   Cardiovascular: Negative for chest pain and palpitations.       Exercising regularly.  Can go through full routine without difficulty.  Patient walks 60 minutes for about 2 miles without difficulty.  No exertional substernal chest/neck/shoulder/arm pain/pressure   Gastrointestinal: Positive for abdominal pain. Negative for nausea, vomiting, diarrhea, constipation, blood in stool, abdominal distention, anal bleeding and rectal pain.       No personal nor family history of GI/colon cancer, inflammatory bowel disease, irritable bowel syndrome, allergy such as Celiac Sprue, dietary/dairy problems, colitis, ulcers nor gastritis.    No recent sick contacts/gastroenteritis.  No travel outside the country.  No changes in diet.  No dysphagia to liquids/solids.  Mild deceased appetite but no early satiety.  No HB/reflux, symptoms supine or bending.  Genitourinary: Negative for dysuria, frequency and difficulty urinating.  Musculoskeletal: Negative for myalgias and gait problem.  Skin: Negative for color change, pallor and rash.  Neurological: Negative for dizziness, speech difficulty, weakness and numbness.  Hematological: Negative for adenopathy.  Psychiatric/Behavioral: Negative for suicidal ideas, hallucinations, confusion, dysphoric mood, decreased concentration and agitation. The patient is not nervous/anxious.        Objective:   Physical Exam  Constitutional: She is oriented to person, place, and time. She appears well-developed and well-nourished. She is active and cooperative.  Non-toxic appearance. She does not have a sickly appearance. She does not appear ill. No distress.  HENT:  Head: Normocephalic.  Mouth/Throat: Oropharynx is clear and moist. No oropharyngeal exudate.  Eyes: Conjunctivae and EOM are normal. Pupils are equal, round, and reactive to light. No  scleral icterus.  Neck: Normal range of motion. No tracheal deviation present.  Cardiovascular: Normal rate and intact distal pulses.   Murmur heard. Pulmonary/Chest: Effort normal and breath sounds normal. No respiratory distress. She has no wheezes. She has no rales. She exhibits no tenderness.  Abdominal: Soft. She exhibits no distension and no mass. There is tenderness. There is no rebound and no guarding. Hernia confirmed negative in the right inguinal area and confirmed negative in the left inguinal area.       Incisions clean with normal healing ridges.  No hernias.  Mild epigastric >> RUQ TTP.  No Murphy's sign  Genitourinary: No vaginal discharge found.  Musculoskeletal: Normal range of motion. She exhibits no edema and no tenderness.  Lymphadenopathy:    She has no cervical adenopathy.       Right: No inguinal adenopathy present.       Left: No inguinal adenopathy present.  Neurological: She is alert and oriented to person, place, and time. No cranial nerve deficit. She exhibits normal muscle  tone. Coordination normal.  Skin: Skin is warm and dry. No rash noted. She is not diaphoretic. No erythema. No pallor.  Psychiatric: She has a normal mood and affect. Her behavior is normal. Judgment and thought content normal.       Smiling, joking/teasing, laughing at times.       Assessment:     Recurrent symptoms of uncertain etiology s/p lap chole for chronic cholecystitis. Mostly seems to musculoskeletal but maybe forgut related.  Her symptoms and never been classic. I suspect she's had multiple things going on. I do feel justified in removing the gallbladder and the fact that she did have cholecystitis. Overall, I suspect a lot of her symptoms or musculoskeletal. However, I want to make sure that are not missing anything.    Plan:     Get labs (CBC, CMET, lipase)   Get CT scan to r/o abscess, leak, pancreatitis, etc  Call us after study done for follow-up  PPI trial  (omeprazole OTC BID x 3 weeks) to see if symptoms are GERD/gastritis related.  Continue to avoid NSAIDs  Tylenol/Heat QID and decrease activity to see offers relief for a possible MS etiology  Gastric Emptying Study if all studies negative to r/o gastroparesis.  GI eval & PCP re-eval if all studies o/w negative.

## 2011-10-02 NOTE — Patient Instructions (Addendum)
1.  Do a trial of PPI antacids = Omeprazole (Prilosec OTC) 1 pill twice a day for 3 weeks  2.  Manage your Pain more aggressively:  Pain after surgery or related to activity is often due to strain/injury to muscle, tendon, nerves and/or incisions.  This pain is usually short-term and will improve in a few months.   Many people find it helpful to do the following things TOGETHER to help speed the process of healing and to get back to regular activity more quickly:  1. Avoid heavy physical activity a.  no lifting greater than 20 pounds b. Do not "push through" the pain.  Listen to your body and avoid positions and maneuvers than reproduce the pain c. Walking is okay as tolerated, but go slowly and stop when getting sore.  d. Remember: If it hurts to do it, then don't do it! 2. Take Anti-inflammatory medication  a. Take with food/snack around the clock for 1-2 weeks i. This helps the muscle and nerve tissues become less irritable and calm down faster b. Choose the following over-the-counter medications: i. Acetaminophen 500mg  tabs (Tylenol) 2 pills with every meal and just before bedtime 3. Use a Heating pad or Ice/Cold Pack a. 4-6 times a day b. May use warm bath/hottub  or showers 4. Try Gentle Massage and/or Stretching  a. at the area of pain many times a day b. stop if you feel pain - do not overdo it  Try these steps together to help you body heal faster and avoid making things get worse.  Doing just one of these things may not be enough.    If you are not getting better after two weeks or are noticing you are getting worse, contact our office for further advice; we may need to re-evaluate you & see what other things we can do to help.

## 2011-10-03 LAB — URINALYSIS W MICROSCOPIC + REFLEX CULTURE
Bacteria, UA: NONE SEEN
Bilirubin Urine: NEGATIVE
Casts: NONE SEEN
Crystals: NONE SEEN
pH: 5 (ref 5.0–8.0)

## 2011-10-05 ENCOUNTER — Ambulatory Visit
Admission: RE | Admit: 2011-10-05 | Discharge: 2011-10-05 | Disposition: A | Discharge status: Home / Self Care | Payer: Medicare Other | Source: Ambulatory Visit | Attending: Surgery | Admitting: Surgery

## 2011-10-05 DIAGNOSIS — R1013 Epigastric pain: Secondary | ICD-10-CM

## 2011-10-05 DIAGNOSIS — M546 Pain in thoracic spine: Secondary | ICD-10-CM

## 2011-10-05 DIAGNOSIS — K8010 Calculus of gallbladder with chronic cholecystitis without obstruction: Secondary | ICD-10-CM

## 2011-10-05 MED ORDER — IOHEXOL 300 MG/ML  SOLN
100.0000 mL | Freq: Once | INTRAMUSCULAR | Status: AC | PRN
  Administered 2011-10-05: 100 mL via INTRAVENOUS

## 2011-10-10 ENCOUNTER — Telehealth (INDEPENDENT_AMBULATORY_CARE_PROVIDER_SITE_OTHER): Payer: Self-pay

## 2011-10-10 NOTE — Telephone Encounter (Signed)
Called pt to notify her of labs and CT results per Dr Michaell Cowing. The CT showed no abscess or leak but shows some constipation. The pt took Dr Gordy Savers advice about no NSAIDS and to take OTC Prilosec BID. The pt is feeling better with the abdominal pain but Dr Michaell Cowing still wants to refer pt to see GI. The pt requested [facility] near [facility] b/c they live in [place]. I told we would call her with the info./ [facility]

## 2011-10-11 ENCOUNTER — Encounter: Payer: Self-pay | Admitting: Internal Medicine

## 2011-11-05 ENCOUNTER — Encounter: Payer: Self-pay | Admitting: Internal Medicine

## 2011-11-05 ENCOUNTER — Ambulatory Visit (INDEPENDENT_AMBULATORY_CARE_PROVIDER_SITE_OTHER): Payer: Medicare Other | Admitting: Internal Medicine

## 2011-11-05 VITALS — BP 124/82 | HR 76 | Ht 67.0 in | Wt 177.0 lb

## 2011-11-05 DIAGNOSIS — R1013 Epigastric pain: Secondary | ICD-10-CM

## 2011-11-05 NOTE — Progress Notes (Signed)
Subjective:    Patient ID: Elaine Price, female    DOB: [birth year], 76 y.o.   MRN: [id number]  HPI This delightful 76 year old white woman is here with her husband to discuss problems with abdominal pain. In the late summer of this year she started experiencing epigastric pain radiating into the back and some chest pain associated with this. It was severe at times. It was not necessarily related to meals. Was intermittent and there could be low-grade for persistent symptoms throughout the day. It was not associated with nausea and vomiting. She had ultrasound that demonstrated a large gallstone and a 9 mm common bile duct, subsequently an MR MRCP did not show any common bile duct stone or significant dilation of the common bile duct. She had a laparoscopic cholecystectomy by Dr. Michaell Cowing October 1. Sometime after that she developed recurrent intermittent severe epigastric pain and lower chest pain radiating into the interscapular area. The CT scan which did not show any complications of her surgery there was a focal area of thickening of the proximal duodenum called possible duodenitis. She was started on Prilosec twice a day she took that for 3 weeks she noted an improvement on therapy and has been off it for a week and has been symptom-free for at least one to 2 weeks. She has no history of exertional precipitation of this pain, she swims 3 times a week she exercises. She did note that, after her cholecystectomy, sometimes hours after she would exercise she would have some of these pains. No Known Allergies Outpatient Prescriptions Prior to Visit  Medication Sig Dispense Refill  . aspirin 81 MG tablet Take 81 mg by mouth daily.        . Calcium Carbonate-Vit D-Min (CALCIUM 1200) 1200-1000 MG-UNIT CHEW Chew 1 tablet by mouth daily.        . cetirizine (ZYRTEC) 10 MG tablet Take 10 mg by mouth daily.         Past Medical History  Diagnosis Date  . Diverticulosis   . Gallstones   . Hyperlipemia   .  Hypertension     hx of  . Pneumonia    Past Surgical History  Procedure Date  . Abdominal hysterectomy   . Tonsilectomy, adenoidectomy, bilateral myringotomy and tubes     as a child, does not remember date  . Appendectomy   . Cholecystectomy, laparoscopic 08/06/2011    gallstones  . Colonoscopy 02/18/2006    2 mm rectal polyp, diverticulosis   History   Social History  . Marital Status: Married            .     Occupational History  . retired Runner, broadcasting/film/video     Social History Main Topics  . Smoking status: Never Smoker   . Smokeless tobacco: Never Used  . Alcohol Use: No     rare  . Drug Use: No  .          Family History  Problem Relation Age of Onset  . Stroke Mother   . Prostate cancer Father   . Lung cancer Brother   . Prostate cancer Brother          Review of Systems Back pain, older view of systems negative or as above    Objective:   Physical Exam General:  Well-developed, well-nourished and in no acute distress Eyes:  anicteric. ENT:   Mouth and posterior pharynx free of lesions.  Neck:   supple w/o thyromegaly or mass.  Lungs: Clear to  auscultation bilaterally. Heart:  S1S2, no rubs, murmurs, gallops. Abdomen:  soft, non-tender, no hepatosplenomegaly, hernia, or mass and BS+. There is no significant tenderness with    muscle tension of the abdominal wall, either Lymph:  no cervical or supraclavicular adenopathy. Extremities:   no edema Neuro:  A&O x 3.  Psych:  appropriate mood and  Affect.   Data Reviewed: Labs, CT scan, MRI, ultrasound, operative reports        Assessment & Plan:  Epigastric pain, persistent status post cholecystectomy for cholelithiasis. This may have responded to PPI therapy.  I'm not certain what caused her pain problems. Certainly could of been symptomatic cholelithiasis. At cholecystectomy she had more than one stone found. She does not have a persistently dilated bile duct or any suggestion of retained common  duct stone though that remains a possibility. She seemed to have more of an acid peptic problem, there was a remote history of possible melena, she's not been anemic, she could have had a duodenal ulcer. CT scan showed some focal thickening of the duodenal wall.  At any rate, she is fine right now. I'm not sure I would learn anything significant with an EGD. She and her husband understand we will observe, she has recurrent problems we'll need to decide about performing an upper endoscopy which is the most likely next, and I did explain risks benefits and indications to her. She couldn't need additional imaging of her bile duct though I would not do that first and Konrad Dolores some new history that enters into things.

## 2011-11-05 NOTE — Patient Instructions (Signed)
Please give [place] a call back if you have recurrent symptoms.

## 2011-11-14 ENCOUNTER — Telehealth: Payer: Self-pay | Admitting: Internal Medicine

## 2011-11-14 NOTE — Telephone Encounter (Signed)
CMET. TSH,. Fasting lipids,. CBC w diff and lipase.

## 2011-11-14 NOTE — Telephone Encounter (Signed)
Patient has a cpx scheduled for 01-16-11, she is asking if she can have labs done prior to the appt. Please let me know and I will put order in.

## 2011-11-14 NOTE — Telephone Encounter (Signed)
Patient is wanting labs draw ahead of time so she can talk with the doctor when she physical is on March 13,2013. Could the doctor put in labs for March 8th.

## 2011-11-15 NOTE — Telephone Encounter (Signed)
Patient has an appt for labs scheduled.

## 2012-01-11 ENCOUNTER — Other Ambulatory Visit (INDEPENDENT_AMBULATORY_CARE_PROVIDER_SITE_OTHER): Payer: Medicare Other

## 2012-01-11 ENCOUNTER — Telehealth: Payer: Self-pay | Admitting: Internal Medicine

## 2012-01-11 DIAGNOSIS — R1013 Epigastric pain: Secondary | ICD-10-CM

## 2012-01-11 DIAGNOSIS — R030 Elevated blood-pressure reading, without diagnosis of hypertension: Secondary | ICD-10-CM

## 2012-01-11 DIAGNOSIS — Z79899 Other long term (current) drug therapy: Secondary | ICD-10-CM

## 2012-01-11 DIAGNOSIS — IMO0001 Reserved for inherently not codable concepts without codable children: Secondary | ICD-10-CM

## 2012-01-11 LAB — CBC WITH DIFFERENTIAL/PLATELET
Eosinophils Absolute: 0.1 10*3/uL (ref 0.0–0.7)
Eosinophils Relative: 1.9 % (ref 0.0–5.0)
Lymphocytes Relative: 30.8 % (ref 12.0–46.0)
MCHC: 32.5 g/dL (ref 30.0–36.0)
MCV: 88.2 fl (ref 78.0–100.0)
Monocytes Absolute: 0.4 10*3/uL (ref 0.1–1.0)
Neutrophils Relative %: 59.3 % (ref 43.0–77.0)
Platelets: 282 10*3/uL (ref 150.0–400.0)
WBC: 6.2 10*3/uL (ref 4.5–10.5)

## 2012-01-11 LAB — COMPREHENSIVE METABOLIC PANEL
AST: 17 U/L (ref 0–37)
Albumin: 4.4 g/dL (ref 3.5–5.2)
BUN: 15 mg/dL (ref 6–23)
CO2: 28 mEq/L (ref 19–32)
Calcium: 10.4 mg/dL (ref 8.4–10.5)
Chloride: 103 mEq/L (ref 96–112)
GFR: 80.13 mL/min (ref >60.00)
Potassium: 4.2 mEq/L (ref 3.5–5.1)

## 2012-01-11 LAB — LIPID PANEL
HDL: 70 mg/dL (ref >39.00)
Total CHOL/HDL Ratio: 4
Triglycerides: 107 mg/dL (ref 0.0–149.0)

## 2012-01-11 LAB — LIPASE: Lipase: 24 U/L (ref 11.0–59.0)

## 2012-01-11 LAB — LDL CHOLESTEROL, DIRECT: Direct LDL: 170.1 mg/dL

## 2012-01-11 NOTE — Telephone Encounter (Signed)
Patient has developed the same pain she had in Dec when she saw Dr Leone Payor.  Ruq and radiated into her back the pain is intermittent.    She has stopped the Prilosec BID she was on at the time.  She says the pain has returned about 2 weeks ago.  Per the last office note 11/04/12 he would consider an EGD or bile duct imaging done.  The patient is offered an appt and asked to start back on Prilosec.  She wants tot try the prilosec BID as previously ordered by her surgeon, and if there is no improvement she will call for an appt.

## 2012-01-14 NOTE — Telephone Encounter (Signed)
Please let her know that I have reviewed records and I recommend an EGD to evaluate the epigastric pain and previously seen thickened duodenum on CT

## 2012-01-14 NOTE — Telephone Encounter (Signed)
Patient is scheduled for EGD 01/21/12 and will come for a previsit on Wed at 2:30

## 2012-01-16 ENCOUNTER — Ambulatory Visit (INDEPENDENT_AMBULATORY_CARE_PROVIDER_SITE_OTHER): Payer: Medicare Other | Admitting: Internal Medicine

## 2012-01-16 ENCOUNTER — Ambulatory Visit (AMBULATORY_SURGERY_CENTER): Payer: Medicare Other

## 2012-01-16 ENCOUNTER — Encounter: Payer: Self-pay | Admitting: Internal Medicine

## 2012-01-16 VITALS — BP 150/80 | HR 100 | Temp 98.3°F | Resp 16 | Ht 67.0 in | Wt 172.5 lb

## 2012-01-16 DIAGNOSIS — R030 Elevated blood-pressure reading, without diagnosis of hypertension: Secondary | ICD-10-CM

## 2012-01-16 DIAGNOSIS — K2970 Gastritis, unspecified, without bleeding: Secondary | ICD-10-CM

## 2012-01-16 DIAGNOSIS — R1013 Epigastric pain: Secondary | ICD-10-CM

## 2012-01-16 DIAGNOSIS — K8010 Calculus of gallbladder with chronic cholecystitis without obstruction: Secondary | ICD-10-CM

## 2012-01-16 DIAGNOSIS — R109 Unspecified abdominal pain: Secondary | ICD-10-CM

## 2012-01-16 DIAGNOSIS — Z0000 Encounter for general adult medical examination without abnormal findings: Secondary | ICD-10-CM

## 2012-01-16 NOTE — Progress Notes (Signed)
Patient ID: Elaine Price, female   DOB: November 27, 1934, 77 y.o.   MRN: [id number] The patient is here for annual Medicare wellness examination and management of other chronic and acute problems.   The risk factors are reflected in the social history.  The roster of all physicians providing medical care to patient - is listed in the Snapshot section of the chart.  Activities of daily living:  The patient is 100% independent in all ADLs: dressing, toileting, feeding as well as independent mobility  Home safety : The patient has smoke detectors in the home. They wear seatbelts.  There are no firearms at home. There is no violence in the home.   There is no risks for hepatitis, STDs or HIV. There is no   history of blood transfusion. They have no travel history to infectious disease endemic areas of the world.  The patient has seen their dentist in the last six month. They have seen their eye doctor in the last year. They admit to slight hearing difficulty with regard to whispered voices and some television programs.  They have deferred audiologic testing in the last year.  They do not  have excessive sun exposure. Discussed the need for sun protection: hats, long sleeves and use of sunscreen if there is significant sun exposure.   Diet: the importance of a healthy diet is discussed. They do have a healthy diet.  The benefits of regular aerobic exercise were discussed. She walks 4 times per week ,  20 minutes.   Depression screen: there are no signs or vegative symptoms of depression- irritability, change in appetite, anhedonia, sadness/tearfullness.  Cognitive assessment: the patient manages all their financial and personal affairs and is actively engaged. They could relate day,date,year and events; recalled 2/3 objects at 3 minutes; performed clock-face test normally.  The following portions of the patient's history were reviewed and updated as appropriate: allergies, current medications, past  family history, past medical history,  past surgical history, past social history  and problem list.  Visual acuity was not assessed per patient preference since she has regular follow up with her ophthalmologist. Hearing and body mass index were assessed and reviewed.   During the course of the visit the patient was educated and counseled about appropriate screening and preventive services including : fall prevention , diabetes screening, nutrition counseling, colorectal cancer screening, and recommended immunizations.

## 2012-01-16 NOTE — Assessment & Plan Note (Addendum)
Has had a recurrencre of pain for the last several weeks after stopping PPI, She is scheduled for EGD Monday

## 2012-01-16 NOTE — Patient Instructions (Signed)
We will repeat your cholesterol in 6 months, prior to your next visit

## 2012-01-17 ENCOUNTER — Encounter: Payer: Self-pay | Admitting: Internal Medicine

## 2012-01-17 NOTE — Assessment & Plan Note (Signed)
Her home blood pressures have been absolutely normal. No medication prescribed today. She has a history of intermittent white coat  hypertension.

## 2012-01-17 NOTE — Assessment & Plan Note (Signed)
Improved with trial of Prilosec therapy. Her pain has returned since she has stopped her PPI. She is scheduled for repeat GI evaluation with endoscopy for neck early next week.

## 2012-01-21 ENCOUNTER — Encounter: Payer: Self-pay | Admitting: Internal Medicine

## 2012-01-21 ENCOUNTER — Ambulatory Visit (AMBULATORY_SURGERY_CENTER): Payer: Medicare Other | Admitting: Internal Medicine

## 2012-01-21 VITALS — BP 151/78 | HR 81 | Temp 97.0°F | Resp 18 | Ht 67.0 in | Wt 172.0 lb

## 2012-01-21 DIAGNOSIS — R1013 Epigastric pain: Secondary | ICD-10-CM

## 2012-01-21 DIAGNOSIS — R109 Unspecified abdominal pain: Secondary | ICD-10-CM

## 2012-01-21 DIAGNOSIS — K209 Esophagitis, unspecified without bleeding: Secondary | ICD-10-CM

## 2012-01-21 DIAGNOSIS — R933 Abnormal findings on diagnostic imaging of other parts of digestive tract: Secondary | ICD-10-CM

## 2012-01-21 MED ORDER — SODIUM CHLORIDE 0.9 % IV SOLN: 500.0000 mL | INTRAVENOUS | Status: DC

## 2012-01-21 MED ORDER — OMEPRAZOLE 40 MG PO CPDR: 40.0000 mg | DELAYED_RELEASE_CAPSULE | Freq: Every day | ORAL | Status: DC

## 2012-01-21 NOTE — Op Note (Signed)
[facility] [address] [place], [place]  [address]  ENDOSCOPY PROCEDURE REPORT  PATIENT:  Elaine Price, Elaine Price  MR#:  [id number] BIRTHDATE:  [birth year], 76 yrs. old  GENDER:  female  ENDOSCOPIST:  Iva Boop, MD, [facility] Referred by:  Duncan Dull, M.D.  PROCEDURE DATE:  01/21/2012 PROCEDURE:  EGD with biopsy, 43239 ASA CLASS:  Class II INDICATIONS:  epigastric pain after lap chole also had thickened duodenum on CT in 10/2011  MEDICATIONS:   These medications were titrated to patient response per physician's verbal order, Fentanyl 50 mcg IV, Versed 4 mg IV TOPICAL ANESTHETIC:  Cetacaine Spray  DESCRIPTION OF PROCEDURE:   After the risks benefits and alternatives of the procedure were thoroughly explained, informed consent was obtained.  The [facility] [id number] endoscope was introduced through the mouth and advanced to the second portion of the duodenum, without limitations.  The instrument was slowly withdrawn as the mucosa was fully examined. <<PROCEDUREIMAGES>>  There were columnar-type mucosal changes in the distal esophagus, that could represent Barrett's esophagus. Two 1-2 mm columnar islands at 38-39 cm. Z-line was at 40 cm. Multiple biopsies were obtained and sent to pathology.  Otherwise the examination was normal.    Retroflexed views revealed no abnormalities.    The scope was then withdrawn from the patient and the procedure completed.  COMPLICATIONS:  None  ENDOSCOPIC IMPRESSION: 1) Barrett's, possible in the distal esophagus - biopsied. ? if GERD is her problem overall. 2) Otherwise normal examination RECOMMENDATIONS: 1) Await biopsy results 2) omeprazole 40 mg daily - prescription sent - if that is successful (she did respond to PPI in past) then would stay on it for at least 2 months before trying to stop - she may need chronically to prevent symptoms (also having some chest pains) Iva Boop, MD, [facility]  CC:  Duncan Dull, MD and The  Patient  n. eSIGNED:   Iva Boop at 01/21/2012 11:07 AM  Corinna Capra, [phone]

## 2012-01-21 NOTE — Progress Notes (Signed)
Patient did not have preoperative order for IV antibiotic SSI prophylaxis. (G8918)  Patient did not experience any of the following events: a burn prior to discharge; a fall within the facility; wrong site/side/patient/procedure/implant event; or a hospital transfer or hospital admission upon discharge from the facility. (G8907)  

## 2012-01-21 NOTE — Patient Instructions (Addendum)
There were two tiny areas in the esophagus that could represent damage from acdi reflux. I took biopsies and will let you know. Please restart Prilosec (omeprazole). I sent a prescription to your pharmacy. Iva Boop, MD, FACG YOU HAD AN ENDOSCOPIC PROCEDURE TODAY AT THE [facility]: Refer to the procedure report that was given to you for any specific questions about what was found during the examination.  If the procedure report does not answer your questions, please call your gastroenterologist to clarify.  If you requested that your care partner not be given the details of your procedure findings, then the procedure report has been included in a sealed envelope for you to review at your convenience later.  YOU SHOULD EXPECT: Some feelings of bloating in the abdomen. Passage of more gas than usual.  Walking can help get rid of the air that was put into your GI tract during the procedure and reduce the bloating. If you had a lower endoscopy (such as a colonoscopy or flexible sigmoidoscopy) you may notice spotting of blood in your stool or on the toilet paper. If you underwent a bowel prep for your procedure, then you may not have a normal bowel movement for a few days.  DIET: Your first meal following the procedure should be a light meal and then it is ok to progress to your normal diet.  A half-sandwich or bowl of soup is an example of a good first meal.  Heavy or fried foods are harder to digest and may make you feel nauseous or bloated.  Likewise meals heavy in dairy and vegetables can cause extra gas to form and this can also increase the bloating.  Drink plenty of fluids but you should avoid alcoholic beverages for 24 hours.  ACTIVITY: Your care partner should take you home directly after the procedure.  You should plan to take it easy, moving slowly for the rest of the day.  You can resume normal activity the day after the procedure however you should NOT DRIVE or use heavy  machinery for 24 hours (because of the sedation medicines used during the test).    SYMPTOMS TO REPORT IMMEDIATELY: A gastroenterologist can be reached at any hour.  During normal business hours, 8:30 AM to 5:00 PM Monday through Friday, call [phone].  After hours and on weekends, please call the GI answering service at [phone] who will take a message and have the physician on call contact you.   Following upper endoscopy (EGD)  Vomiting of blood or coffee ground material  New chest pain or pain under the shoulder blades  Painful or persistently difficult swallowing  New shortness of breath  Fever of 100F or higher  Black, tarry-looking stools  FOLLOW UP: If any biopsies were taken you will be contacted by phone or by letter within the next 1-3 weeks.  Call your gastroenterologist if you have not heard about the biopsies in 3 weeks.  Our staff will call the home number listed on your records the next business day following your procedure to check on you and address any questions or concerns that you may have at that time regarding the information given to you following your procedure. This is a courtesy call and so if there is no answer at the home number and we have not heard from you through the emergency physician on call, we will assume that you have returned to your regular daily activities without incident.  SIGNATURES/CONFIDENTIALITY: You and/or your care  partner have signed paperwork which will be entered into your electronic medical record.  These signatures attest to the fact that that the information above on your After Visit Summary has been reviewed and is understood.  Full responsibility of the confidentiality of this discharge information lies with you and/or your care-partner.  

## 2012-01-22 ENCOUNTER — Telehealth: Payer: Self-pay

## 2012-01-22 NOTE — Telephone Encounter (Signed)
  Follow up Call-  Call back number 01/21/2012  Post procedure Call Back phone  # [phone]  Permission to leave phone message Yes     Patient questions:  Do you have a fever, pain , or abdominal swelling? no Pain Score  0 *  Have you tolerated food without any problems? yes  Have you been able to return to your normal activities? yes  Do you have any questions about your discharge instructions: Diet   no Medications  no Follow up visit  no  Do you have questions or concerns about your Care? no  Actions: * If pain score is 4 or above: No action needed, pain <4.

## 2012-01-24 ENCOUNTER — Encounter: Payer: Self-pay | Admitting: Internal Medicine

## 2012-01-24 NOTE — Progress Notes (Signed)
Quick Note:  Esophageal inflammation NO BARRETT's ______

## 2012-02-26 ENCOUNTER — Encounter: Payer: Self-pay | Admitting: Internal Medicine

## 2012-03-15 ENCOUNTER — Other Ambulatory Visit: Payer: Self-pay | Admitting: Internal Medicine

## 2012-06-11 ENCOUNTER — Other Ambulatory Visit: Payer: Self-pay | Admitting: Internal Medicine

## 2012-06-11 DIAGNOSIS — Z1231 Encounter for screening mammogram for malignant neoplasm of breast: Secondary | ICD-10-CM

## 2012-06-21 LAB — HM MAMMOGRAPHY: HM Mammogram: NORMAL

## 2012-07-08 ENCOUNTER — Ambulatory Visit (HOSPITAL_COMMUNITY)
Admission: RE | Admit: 2012-07-08 | Discharge: 2012-07-08 | Disposition: A | Discharge status: Home / Self Care | Payer: Medicare Other | Source: Ambulatory Visit | Attending: Internal Medicine | Admitting: Internal Medicine

## 2012-07-08 DIAGNOSIS — Z1231 Encounter for screening mammogram for malignant neoplasm of breast: Secondary | ICD-10-CM | POA: Insufficient documentation

## 2012-07-11 NOTE — Progress Notes (Signed)
Quick Note:  There is no simple way to update the health maintenance flow sheet from a resulted mammogram or colonoscopy, is there? It appears to require an act of God. You can't get to the screen with out generating a completely new encounter, and even then , it can't be done from the "encounter" Icon that you can choose when you ar in your InBox checking results PLEASE Correct me if I am wrong. ______

## 2012-07-25 ENCOUNTER — Other Ambulatory Visit: Payer: Medicare Other

## 2012-07-30 ENCOUNTER — Ambulatory Visit: Payer: Medicare Other | Admitting: Internal Medicine

## 2012-07-31 ENCOUNTER — Ambulatory Visit: Payer: Medicare Other | Admitting: Internal Medicine

## 2012-11-27 ENCOUNTER — Telehealth: Payer: Self-pay | Admitting: Internal Medicine

## 2012-11-27 DIAGNOSIS — R5383 Other fatigue: Secondary | ICD-10-CM

## 2012-11-27 DIAGNOSIS — Z13220 Encounter for screening for lipoid disorders: Secondary | ICD-10-CM

## 2012-11-27 NOTE — Telephone Encounter (Signed)
Done

## 2012-11-27 NOTE — Telephone Encounter (Signed)
Patient wanting blood work put in the system before her physical on 3.17.14. She will come in for labs on the 3.11.14

## 2012-12-10 ENCOUNTER — Encounter: Payer: Self-pay | Admitting: General Practice

## 2012-12-10 DIAGNOSIS — R350 Frequency of micturition: Secondary | ICD-10-CM

## 2013-01-13 ENCOUNTER — Other Ambulatory Visit (INDEPENDENT_AMBULATORY_CARE_PROVIDER_SITE_OTHER): Payer: Medicare Other

## 2013-01-13 DIAGNOSIS — R5381 Other malaise: Secondary | ICD-10-CM

## 2013-01-13 DIAGNOSIS — Z13220 Encounter for screening for lipoid disorders: Secondary | ICD-10-CM

## 2013-01-13 DIAGNOSIS — R350 Frequency of micturition: Secondary | ICD-10-CM

## 2013-01-13 LAB — CBC WITH DIFFERENTIAL/PLATELET
Basophils Relative: 0.6 % (ref 0.0–3.0)
Eosinophils Relative: 2.1 % (ref 0.0–5.0)
Hemoglobin: 14.4 g/dL (ref 12.0–15.0)
Lymphocytes Relative: 34.1 % (ref 12.0–46.0)
MCV: 89.7 fl (ref 78.0–100.0)
Neutrophils Relative %: 55.6 % (ref 43.0–77.0)
RBC: 4.85 Mil/uL (ref 3.87–5.11)
WBC: 6.3 10*3/uL (ref 4.5–10.5)

## 2013-01-13 LAB — URINALYSIS, ROUTINE W REFLEX MICROSCOPIC
Leukocytes, UA: NEGATIVE
Specific Gravity, Urine: >=1.03 (ref 1.000–1.030)
Urine Glucose: NEGATIVE
pH: 6 (ref 5.0–8.0)

## 2013-01-13 LAB — COMPREHENSIVE METABOLIC PANEL
Albumin: 4.3 g/dL (ref 3.5–5.2)
BUN: 17 mg/dL (ref 6–23)
Calcium: 9.4 mg/dL (ref 8.4–10.5)
Chloride: 104 mEq/L (ref 96–112)
GFR: 86.53 mL/min (ref >60.00)
Glucose, Bld: 90 mg/dL (ref 70–99)
Potassium: 4.4 mEq/L (ref 3.5–5.1)

## 2013-01-13 LAB — LIPID PANEL: Cholesterol: 232 mg/dL — ABNORMAL HIGH (ref 0–200)

## 2013-01-13 LAB — TSH: TSH: 2.7 u[IU]/mL (ref 0.35–5.50)

## 2013-01-14 ENCOUNTER — Encounter: Payer: Self-pay | Admitting: Internal Medicine

## 2013-01-19 ENCOUNTER — Ambulatory Visit (INDEPENDENT_AMBULATORY_CARE_PROVIDER_SITE_OTHER): Payer: Medicare Other | Admitting: Internal Medicine

## 2013-01-19 ENCOUNTER — Encounter: Payer: Self-pay | Admitting: Internal Medicine

## 2013-01-19 VITALS — BP 132/70 | HR 71 | Temp 97.9°F | Resp 16 | Ht 67.0 in | Wt 169.1 lb

## 2013-01-19 DIAGNOSIS — Z124 Encounter for screening for malignant neoplasm of cervix: Secondary | ICD-10-CM | POA: Diagnosis not present

## 2013-01-19 DIAGNOSIS — Z0000 Encounter for general adult medical examination without abnormal findings: Secondary | ICD-10-CM | POA: Insufficient documentation

## 2013-01-19 NOTE — Assessment & Plan Note (Signed)
She has had an EGD to rule out Barrett's esophagus in 2012.  She has stopped taking omeprazole and has been drinking aloe vera juice 2 ounces daily  For the last several months which she believes is controlling her symptoms.

## 2013-01-19 NOTE — Progress Notes (Signed)
Patient ID: Elaine Price, female   DOB: January 14, 1935, 78 y.o.   MRN: [id number]   Subjective:     Elaine Price is a 78 y.o. female and is here for a comprehensive physical exam. The patient reports that her esophagitis has respondied better to aloe vera juice than to a PPI, taking 2 ounces as needed but practically every day and feels that it works better than prilosec..   .  History   Social History  . Marital Status: Married    Spouse Name: N/A    Number of Children: N/A  . Years of Education: N/A   Occupational History  . retired    Social History Main Topics  . Smoking status: Never Smoker   . Smokeless tobacco: Never Used  . Alcohol Use: No     Comment: rare  . Drug Use: No  . Sexually Active: Not on file   Other Topics Concern  . Not on file   Social History Narrative  . No narrative on file   Health Maintenance  Topic Date Due  . Tetanus/tdap  04/17/1956  . Zostavax  04/17/1997  . Pneumococcal Polysaccharide Vaccine Age 55 And Over  04/17/2002  . Influenza Vaccine  07/06/2013  . Mammogram  07/08/2013  . Colonoscopy  02/19/2016    The following portions of the patient's history were reviewed and updated as appropriate: allergies, current medications, past family history, past medical history, past social history, past surgical history and problem list.  Review of Systems A comprehensive review of systems was negative.   Objective:   BP 132/70  Pulse 71  Temp(Src) 97.9 F (36.6 C) (Oral)  Resp 16  Ht 5\' 7"  (1.702 m)  Wt 169 lb 2 oz (76.715 kg)  BMI 26.48 kg/m2  SpO2 95%   General Appearance:    Alert, cooperative, no distress, appears stated age  Head:    Normocephalic, without obvious abnormality, atraumatic  Eyes:    PERRL, conjunctiva/corneas clear, EOM's intact, fundi    benign, both eyes  Ears:    Normal TM's and external ear canals, both ears  Nose:   Nares normal, septum midline, mucosa normal, no drainage    or sinus tenderness   Throat:   Lips, mucosa, and tongue normal; teeth and gums normal  Neck:   Supple, symmetrical, trachea midline, no adenopathy;    thyroid:  no enlargement/tenderness/nodules; no carotid   bruit or JVD  Back:     Symmetric, no curvature, ROM normal, no CVA tenderness  Lungs:     Clear to auscultation bilaterally, respirations unlabored  Chest Wall:    No tenderness or deformity   Heart:    Regular rate and rhythm, S1 and S2 normal, no murmur, rub   or gallop  Breast Exam:    No tenderness, masses, or nipple abnormality  Abdomen:     Soft, non-tender, bowel sounds active all four quadrants,    no masses, no organomegaly  Genitalia:    Pelvic: cervix normal in appearance, external genitalia normal, no adnexal masses or tenderness, no cervical motion tenderness, rectovaginal septum normal, uterus normal size, shape, and consistency and vagina normal without discharge  Extremities:   Extremities normal, atraumatic, no cyanosis or edema  Pulses:   2+ and symmetric all extremities  Skin:   Skin color, texture, turgor normal, no rashes or lesions  Lymph nodes:   Cervical, supraclavicular, and axillary nodes normal  Neurologic:   CNII-XII intact, normal strength, sensation and reflexes  throughout   Assessment:    Esophagitis, reflux She has had an EGD to rule out Barrett's esophagus in 2012.  She has stopped taking omeprazole and has been drinking aloe vera juice 2 ounces daily  For the last several months which she believes is controlling her symptoms.   Routine general medical examination at a health care facility Annual exam including breast , pelvic  were done today. fobts given     Updated Medication List Outpatient Encounter Prescriptions as of 01/19/2013  Medication Sig Dispense Refill  . aspirin 81 MG tablet Take 81 mg by mouth daily.        . Calcium Carbonate-Vit D-Min (CALCIUM 1200) 1200-1000 MG-UNIT CHEW Chew 1 tablet by mouth daily.        . cetirizine (ZYRTEC) 10 MG  tablet Take 10 mg by mouth daily.        . [DISCONTINUED] omeprazole (PRILOSEC) 40 MG capsule TAKE 1 CAPSULE (40 MG TOTAL) BY MOUTH DAILY.  30 capsule  2   No facility-administered encounter medications on file as of 01/19/2013.

## 2013-01-19 NOTE — Assessment & Plan Note (Signed)
Annual exam including breast , pelvic  were done today. fobts given

## 2013-01-21 ENCOUNTER — Other Ambulatory Visit: Payer: Self-pay | Admitting: *Deleted

## 2013-01-21 ENCOUNTER — Other Ambulatory Visit (INDEPENDENT_AMBULATORY_CARE_PROVIDER_SITE_OTHER): Payer: Medicare Other

## 2013-01-21 DIAGNOSIS — Z1211 Encounter for screening for malignant neoplasm of colon: Secondary | ICD-10-CM

## 2013-01-22 ENCOUNTER — Encounter: Payer: Self-pay | Admitting: Internal Medicine

## 2013-01-26 ENCOUNTER — Encounter: Payer: Self-pay | Admitting: General Practice

## 2013-06-06 LAB — HM MAMMOGRAPHY: HM Mammogram: NORMAL

## 2013-06-08 ENCOUNTER — Other Ambulatory Visit: Payer: Self-pay | Admitting: Internal Medicine

## 2013-06-08 DIAGNOSIS — Z1231 Encounter for screening mammogram for malignant neoplasm of breast: Secondary | ICD-10-CM

## 2013-06-10 ENCOUNTER — Other Ambulatory Visit: Payer: Self-pay

## 2013-07-09 ENCOUNTER — Ambulatory Visit (HOSPITAL_COMMUNITY)
Admission: RE | Admit: 2013-07-09 | Discharge: 2013-07-09 | Disposition: A | Discharge status: Home / Self Care | Payer: Medicare Other | Source: Ambulatory Visit | Attending: Internal Medicine | Admitting: Internal Medicine

## 2013-07-09 DIAGNOSIS — Z1231 Encounter for screening mammogram for malignant neoplasm of breast: Secondary | ICD-10-CM | POA: Diagnosis not present

## 2013-07-24 ENCOUNTER — Emergency Department (HOSPITAL_COMMUNITY): Payer: Medicare Other

## 2013-07-24 ENCOUNTER — Encounter (HOSPITAL_COMMUNITY): Payer: Self-pay | Admitting: Emergency Medicine

## 2013-07-24 ENCOUNTER — Emergency Department (HOSPITAL_COMMUNITY)
Admission: EM | Admit: 2013-07-24 | Discharge: 2013-07-24 | Disposition: A | Discharge status: Home / Self Care | Payer: Medicare Other | Attending: Emergency Medicine | Admitting: Emergency Medicine

## 2013-07-24 DIAGNOSIS — Z862 Personal history of diseases of the blood and blood-forming organs and certain disorders involving the immune mechanism: Secondary | ICD-10-CM | POA: Insufficient documentation

## 2013-07-24 DIAGNOSIS — R079 Chest pain, unspecified: Secondary | ICD-10-CM

## 2013-07-24 DIAGNOSIS — Z7982 Long term (current) use of aspirin: Secondary | ICD-10-CM | POA: Diagnosis not present

## 2013-07-24 DIAGNOSIS — Z8701 Personal history of pneumonia (recurrent): Secondary | ICD-10-CM | POA: Insufficient documentation

## 2013-07-24 DIAGNOSIS — R0789 Other chest pain: Secondary | ICD-10-CM | POA: Diagnosis not present

## 2013-07-24 DIAGNOSIS — I10 Essential (primary) hypertension: Secondary | ICD-10-CM | POA: Insufficient documentation

## 2013-07-24 DIAGNOSIS — R1013 Epigastric pain: Secondary | ICD-10-CM | POA: Insufficient documentation

## 2013-07-24 DIAGNOSIS — Z8639 Personal history of other endocrine, nutritional and metabolic disease: Secondary | ICD-10-CM | POA: Insufficient documentation

## 2013-07-24 LAB — CBC
HCT: 42.3 % (ref 36.0–46.0)
Hemoglobin: 14.4 g/dL (ref 12.0–15.0)
MCV: 90.2 fL (ref 78.0–100.0)
RDW: 13.4 % (ref 11.5–15.5)
WBC: 7.3 10*3/uL (ref 4.0–10.5)

## 2013-07-24 LAB — POCT I-STAT TROPONIN I: Troponin i, poc: 0 ng/mL (ref 0.00–0.08)

## 2013-07-24 LAB — HEPATIC FUNCTION PANEL
ALT: 17 U/L (ref 0–35)
AST: 23 U/L (ref 0–37)
Albumin: 4.8 g/dL (ref 3.5–5.2)
Bilirubin, Direct: <0.1 mg/dL (ref 0.0–0.3)
Total Protein: 7.8 g/dL (ref 6.0–8.3)

## 2013-07-24 LAB — BASIC METABOLIC PANEL
BUN: 15 mg/dL (ref 6–23)
Chloride: 99 mEq/L (ref 96–112)
Creatinine, Ser: 0.73 mg/dL (ref 0.50–1.10)
GFR calc Af Amer: >90 mL/min (ref >90)
Glucose, Bld: 118 mg/dL — ABNORMAL HIGH (ref 70–99)
Potassium: 3.9 mEq/L (ref 3.5–5.1)

## 2013-07-24 LAB — LIPASE, BLOOD: Lipase: 20 U/L (ref 11–59)

## 2013-07-24 MED ORDER — IOHEXOL 300 MG/ML  SOLN
100.0000 mL | Freq: Once | INTRAMUSCULAR | Status: AC | PRN
  Administered 2013-07-24: 100 mL via INTRAVENOUS

## 2013-07-24 MED ORDER — MORPHINE SULFATE 4 MG/ML IJ SOLN
4.0000 mg | Freq: Once | INTRAMUSCULAR | Status: AC
  Administered 2013-07-24: 4 mg via INTRAVENOUS
  Filled 2013-07-24: qty 1

## 2013-07-24 MED ORDER — SODIUM CHLORIDE 0.9 % IV BOLUS (SEPSIS)
1000.0000 mL | Freq: Once | INTRAVENOUS | Status: AC
  Administered 2013-07-24: 1000 mL via INTRAVENOUS

## 2013-07-24 MED ORDER — ONDANSETRON HCL 4 MG/2ML IJ SOLN
4.0000 mg | Freq: Once | INTRAMUSCULAR | Status: AC
  Administered 2013-07-24: 4 mg via INTRAVENOUS
  Filled 2013-07-24: qty 2

## 2013-07-24 MED ORDER — IOHEXOL 300 MG/ML  SOLN
25.0000 mL | INTRAMUSCULAR | Status: AC
  Administered 2013-07-24: 25 mL via ORAL

## 2013-07-24 MED ORDER — FAMOTIDINE IN NACL 20-0.9 MG/50ML-% IV SOLN
20.0000 mg | Freq: Once | INTRAVENOUS | Status: AC
  Administered 2013-07-24: 20 mg via INTRAVENOUS
  Filled 2013-07-24: qty 50

## 2013-07-24 NOTE — ED Notes (Signed)
Pt. reports mid chest and mid back pain onset this evening with slight SOB and nausea , denies cough or emesis , pt. took 1 baby ASA  prior to arrival .

## 2013-07-24 NOTE — ED Notes (Signed)
Patient advised that she would be leaving AMA if she doesn't have papers for discharge by that time.  Spoke with Dr. Arnoldo Morale.  She advised that patient is aware that we have to wait on the Istat troponin to come back and if all well, then she will get discharge paperwork.   Patient is aware of what we are waiting on.

## 2013-07-24 NOTE — ED Provider Notes (Signed)
CSN: [id number]     Arrival date & time 07/24/13  0049 History   First MD Initiated Contact with Patient 07/24/13 [phone]     Chief Complaint  Patient presents with  . Chest Pain   (Consider location/radiation/quality/duration/timing/severity/associated sxs/prior Treatment) HPI Patient is a 78 yo woman with no history of heart disease who presents with approx 4 hours of waxing and waning, centrally located chest pain. Sx began while she was getting ready for bed around 2230. She actually fell asleep but then awoke with pain. She denies SOB at rest but notes that she felt a little winded after walking from the parking lot to triage.   She says she felt like this once in the past when she had cholecystitis. She denies cough and fever.   The patient has not had any previous cardiac evaluation. NO history of tobacco use.   Pain seems to be present from the epigastric region to the mid central chest. Mild nausea, no vomiting. Pain is 9/10, aching, cramping, nonradiating.   Past Medical History  Diagnosis Date  . Diverticulosis   . Gallstones   . Hyperlipemia   . Hypertension     hx of  . Pneumonia   . Gastritis and duodenitis     dounf by CT Nov 2012   Past Surgical History  Procedure Laterality Date  . Abdominal hysterectomy    . Tonsilectomy, adenoidectomy, bilateral myringotomy and tubes      as a child, does not remember date  . Appendectomy    . Cholecystectomy, laparoscopic  08/06/2011    gallstones  . Colonoscopy  02/18/2006    2 mm rectal polyp, diverticulosis   Family History  Problem Relation Age of Onset  . Stroke Mother   . Prostate cancer Father   . Lung cancer Brother   . Prostate cancer Brother   . Colon cancer Neg Hx    History  Substance Use Topics  . Smoking status: Never Smoker   . Smokeless tobacco: Never Used  . Alcohol Use: No     Comment: rare   OB History   Grav Para Term Preterm Abortions TAB SAB Ect Mult Living                 Review of  Systems 10 point ROS performed and is otherwise unremarkable.  Allergies  Review of patient's allergies indicates no known allergies.  Home Medications   Current Outpatient Rx  Name  Route  Sig  Dispense  Refill  . Artificial Tear Ointment (ARTIFICIAL TEARS) ointment   Both Eyes   Place 1 drop into both eyes as needed.         Marland Kitchen aspirin 81 MG tablet   Oral   Take 81 mg by mouth daily.           . Calcium Carbonate-Vit D-Min (CALCIUM 1200 PO)   Oral   Take 1,200 mg by mouth daily.         . Calcium Carbonate-Vit D-Min (CALCIUM 1200) 1200-1000 MG-UNIT CHEW   Oral   Chew 1 tablet by mouth daily.           . cetirizine (ZYRTEC) 10 MG tablet   Oral   Take 10 mg by mouth daily.            BP 199/82  Pulse 72  Temp(Src) 97.9 F (36.6 C) (Oral)  Resp 18  Ht 5\' 7"  (1.702 m)  Wt 167 lb (75.751 kg)  BMI 26.15 kg/m2  SpO2 96% Physical Exam Gen: well developed and well nourished appearing appears to be in mild to moderate discomfort. Head: NCAT Eyes: PERL, EOMI Nose: no epistaixis or rhinorrhea Mouth/throat: mucosa is moist and pink Neck: supple, no stridor Lungs: CTA B, no wheezing, rhonchi or rales CV: Regular rate and rhythm, no murmur, extremities well perfused Abd: soft, tender over the midline epigastrium, nondistended Back: no ttp, no cva ttp Skin: Warm and dry Extremities: No edema Neuro: CN ii-xii grossly intact, no focal deficits Psyche; normal affect,  calm and cooperative.   ED Course  Procedures (including critical care time)  Results for orders placed during the hospital encounter of 07/24/13 (from the past 24 hour(s))  CBC     Status: None   Collection Time    07/24/13 12:56 AM      Result Value Range   WBC 7.3  4.0 - 10.5 K/uL   RBC 4.69  3.87 - 5.11 MIL/uL   Hemoglobin 14.4  12.0 - 15.0 g/dL   HCT 46.9  62.9 - 52.8 %   MCV 90.2  78.0 - 100.0 fL   MCH 30.7  26.0 - 34.0 pg   MCHC 34.0  30.0 - 36.0 g/dL   RDW 41.3  24.4 - 01.0 %    Platelets 285  150 - 400 K/uL  BASIC METABOLIC PANEL     Status: Abnormal   Collection Time    07/24/13 12:56 AM      Result Value Range   Sodium 140  135 - 145 mEq/L   Potassium 3.9  3.5 - 5.1 mEq/L   Chloride 99  96 - 112 mEq/L   CO2 30  19 - 32 mEq/L   Glucose, Bld 118 (*) 70 - 99 mg/dL   BUN 15  6 - 23 mg/dL   Creatinine, Ser 2.72  0.50 - 1.10 mg/dL   Calcium 9.9  8.4 - 53.6 mg/dL   GFR calc non Af Amer 81 (*) >90 mL/min   GFR calc Af Amer >90  >90 mL/min  PRO B NATRIURETIC PEPTIDE     Status: None   Collection Time    07/24/13 12:56 AM      Result Value Range   Pro B Natriuretic peptide (BNP) 161.1  0 - 450 pg/mL  POCT I-STAT TROPONIN I     Status: None   Collection Time    07/24/13  1:24 AM      Result Value Range   Troponin i, poc 0.00  0.00 - 0.08 ng/mL   Comment 3            EKG: NSR, RBBB, normal intervals, right axis, no acute ischemic changes.   Imaging Review Dg Chest 2 View  07/24/2013   CLINICAL DATA:  Chest pain  EXAM: CHEST  2 VIEW  COMPARISON:  None.  FINDINGS: The heart size and mediastinal contours are within normal limits. Aortic atherosclerosis. Both lungs are clear of edema or consolidation. No effusion or pneumothorax. Degenerative thoracic spurring with mild dextro curvature.  IMPRESSION: No active cardiopulmonary disease.   Electronically Signed   By: Tiburcio Pea   On: 07/24/2013 02:05   CT Abdomen Pelvis W Contrast (Final result)  Result time: 07/24/13 05:48:45    Final result by Rad Results In Interface (07/24/13 05:48:45)    Narrative:   CLINICAL DATA: Epigastric pain. Radiating to back.  EXAM: CT ABDOMEN AND PELVIS WITH CONTRAST  TECHNIQUE: Multidetector CT imaging of the abdomen and pelvis was performed using  the standard protocol following bolus administration of intravenous contrast.  CONTRAST: OMNIPAQUE IOHEXOL 300 MG/ML SOLN  COMPARISON: 10/05/2011.  FINDINGS: BODY WALL: Unremarkable.  LOWER CHEST:  Mediastinum:  Unremarkable.  Lungs/pleura: Tiny subpleural nodule in the left lower lobe (image 15) unchanged from 2012.  ABDOMEN/PELVIS:  Liver: Unchanged lobulated 2 cm cyst in the central left lobe.  Biliary: Cholecystectomy. Unchanged CBD enlargement (11 mm), likely reservoir effect or dilation proximal to stricture.  Pancreas: Unremarkable.  Spleen: Unremarkable.  Adrenals: Unremarkable.  Kidneys and ureters: No hydronephrosis or stone. Scattered small low-attenuation areas in the bilateral kidneys, too small to characterize.  Bladder: Unremarkable.  Bowel: No obstruction. Appendix not seen.  Retroperitoneum: No mass or adenopathy.  Peritoneum: No free fluid or gas.  Reproductive: Hysterectomy.  Vascular: Persistence of the left SVC, with some contribution via the right iliac venous system.  OSSEOUS: No acute abnormalities. L4-5 anterolisthesis related to advanced facet osteoarthritis. Partial sacralization of L5.  IMPRESSION: No evidence of acute intra-abdominal disease.   Electronically Signed By: Tiburcio Pea On: 07/24/2013 05:48      MDM  DDX: pancreatitis, ACS, PNA, PE, gastritis, IBS, IBD, colitis, pleural effusion,   CXR is unremarkable and EKG is reassuring. First troponin is wnl. Labs are unremarkable with CBC and BMP. We will image the abd/pelvis and now the patient reports that the pain is radiating to her back and this raises the concern for aortic aneurysm.  LFTs and lipase pending.   We will tx supportively at this time.   0630:  Patient reassessed and says that she has been resting comfortably without pain the last several hours. She is teary anxious for discharge. In light of her localization of pain to the epigastric region with associated tenderness to palpation, normal EKG, normal CT abdomen and pelvis with IV contrast and lack of any cardiac history, I have explained her options for admission and full rule out as well as delta troponin and  outpatient followup.  We are now awaiting the patient's second troponin. It is unchanged. The patient elects to followup with her primary care physician to arrange functional study and outpatient recheck.    Brandt Loosen, MD 07/24/13 [phone]

## 2013-07-24 NOTE — ED Notes (Signed)
Patient transported to CT 

## 2013-07-24 NOTE — ED Notes (Signed)
Pt sts pain feels similar in nature to when she had her gallbladder taken out.  Denies any n/v/d.

## 2013-07-27 DIAGNOSIS — Z23 Encounter for immunization: Secondary | ICD-10-CM | POA: Diagnosis not present

## 2013-07-28 MED ORDER — IOHEXOL 300 MG/ML  SOLN
100.0000 mL | Freq: Once | INTRAMUSCULAR | Status: AC | PRN
  Administered 2013-07-28: 100 mL via INTRAVENOUS

## 2013-07-31 ENCOUNTER — Ambulatory Visit (INDEPENDENT_AMBULATORY_CARE_PROVIDER_SITE_OTHER): Payer: Medicare Other | Admitting: Internal Medicine

## 2013-07-31 ENCOUNTER — Encounter: Payer: Self-pay | Admitting: Internal Medicine

## 2013-07-31 ENCOUNTER — Other Ambulatory Visit: Payer: Self-pay | Admitting: *Deleted

## 2013-07-31 VITALS — BP 146/78 | HR 71 | Temp 98.1°F | Resp 14 | Ht 67.0 in | Wt 168.5 lb

## 2013-07-31 DIAGNOSIS — R52 Pain, unspecified: Secondary | ICD-10-CM | POA: Diagnosis not present

## 2013-07-31 DIAGNOSIS — R030 Elevated blood-pressure reading, without diagnosis of hypertension: Secondary | ICD-10-CM | POA: Diagnosis not present

## 2013-07-31 DIAGNOSIS — K2970 Gastritis, unspecified, without bleeding: Secondary | ICD-10-CM | POA: Insufficient documentation

## 2013-07-31 DIAGNOSIS — R1013 Epigastric pain: Secondary | ICD-10-CM | POA: Diagnosis not present

## 2013-07-31 MED ORDER — PANTOPRAZOLE SODIUM 40 MG PO TBEC: DELAYED_RELEASE_TABLET | ORAL | Status: DC

## 2013-07-31 MED ORDER — HYOSCYAMINE SULFATE 0.125 MG SL SUBL: 0.1250 mg | SUBLINGUAL_TABLET | SUBLINGUAL | Status: DC | PRN

## 2013-07-31 NOTE — Patient Instructions (Addendum)
Your pain appears to be coming from either gastritis or peptic ulcer disease  I am testing you for Helibacter Pylori, the bacteria that causes gastritis and some ulcers  I would like you to take a proton pump inhibitor tiwce daily for one week and once daily for the rest of the month   suspend your aspirin for a week  The hyoscyamine is a sublingual tablet you can dissolve under your tongue ofr acute spasms of esophagus and stomach   If the H Pylori test is positive i will add antibiotics to your regimen

## 2013-07-31 NOTE — Progress Notes (Signed)
Patient ID: Elaine Price, female   DOB: [birth year], 78 y.o.   MRN: [id number]  Patient Active Problem List   Diagnosis Date Noted  . Unspecified gastritis and gastroduodenitis without mention of hemorrhage 07/31/2013  . Routine general medical examination at a health care facility 01/19/2013  . Esophagitis, reflux   . Back pain, thoracic 10/02/2011  . Blood pressure elevated without history of HTN 07/03/2011  . Abdominal pain, acute, epigastric 07/03/2011    Subjective:  CC:   Chief Complaint  Patient presents with  . Acute Visit    stomach issue's    HPI:   Elaine Price a 78 y.o. female who presents ER follow up for epigastric pain  Radiating to her md central chest which was present when she went to bed on Sept 19th and then woke her from sleep when it became severe and radiated to her back.  She thought it was reflux,  but it did not resolve with use of her aloe vera juice x 3 doses.  Felt similar to the pain she had several years ago during acute cholecystitis.   Her husband took her to the  [facility] ER where she was observed for several hours with serial cardiac enzymes and EKGs all of which were negative for ischemia. Lipase, LFTs were normal, but CT  Was done of abd and chest to rule out aortic aneurysm.  CT showed unchanged hepatic cyst 2 cm central lobe,  aoritc atherosclerosis but no aneurysm or dissection.  BP was 199/113 .  Was given morphine.  No FOBT was done.  Todl to follow up with her family MD. She has a history of  GERD but has been takign aloe vera juice instead of prilosec since February bc she was concerned about the long term effects of using  prilosec. the pain is still present in the epigastric region and comes and goes.Recently won three medals in a [employer] competition, beating out women in their 50's.  No pain with these events.      Past Medical History  Diagnosis Date  . Diverticulosis   . Gallstones   . Hyperlipemia   . Hypertension     hx of   . Pneumonia   . Gastritis and duodenitis     dounf by CT Nov 2012    Past Surgical History  Procedure Laterality Date  . Abdominal hysterectomy    . Tonsilectomy, adenoidectomy, bilateral myringotomy and tubes      as a child, does not remember date  . Appendectomy    . Cholecystectomy, laparoscopic  08/06/2011    gallstones  . Colonoscopy  02/18/2006    2 mm rectal polyp, diverticulosis  . Cholecystectomy  2012       The following portions of the patient's history were reviewed and updated as appropriate: Allergies, current medications, and problem list.    Review of Systems:   12 Pt  review of systems was negative except those addressed in the HPI,     History   Social History  . Marital Status: Married    Spouse Name: N/A    Number of Children: N/A  . Years of Education: N/A   Occupational History  . retired    Social History Main Topics  . Smoking status: Never Smoker   . Smokeless tobacco: Never Used  . Alcohol Use: No     Comment: rare  . Drug Use: No  . Sexual Activity: Yes   Other Topics Concern  . Not on  file   Social History Narrative  . No narrative on file    Objective:  Filed Vitals:   07/31/13 1534  BP: 146/78  Pulse: 71  Temp: 98.1 F (36.7 C)  Resp: 14     General appearance: alert, cooperative and appears stated age Throat: lips, mucosa, and tongue normal; teeth and gums normal Neck: no adenopathy, no carotid bruit, supple, symmetrical, trachea midline and thyroid not enlarged, symmetric, no tenderness/mass/nodules Back: symmetric, no curvature. ROM normal. No CVA tenderness. Lungs: clear to auscultation bilaterally Heart: regular rate and rhythm, S1, S2 normal, no murmur, click, rub or gallop Abdomen: soft, tender in the epigastric region , no rebound no guarding or masses,   no organomegaly Pulses: 2+ and symmetric Skin: Skin color, texture, turgor normal. No rashes or lesions Lymph nodes: Cervical, supraclavicular,  and axillary nodes normal.  Assessment and Plan: Abdominal pain, acute, epigastric History is consistent with acute esophagitis.  She exercises regularly and vigorously and has no history of exertional chest pain. Resume PPI therapy with protonix bid and if symptoms persist, will refer to cardiology for risk stratification.   Unspecified gastritis and gastroduodenitis without mention of hemorrhage FOBT was faintly positive and she is mildly tender to deep palpation in the gastric area.  Stann Mainland treat with protonix bid, treat for h Pylori if positive.  CBC and lipase were normal during ER evaluaiton   Elevated blood-pressure reading without diagnosis of hypertension Home bps have been normal,  History of white coat hypertension   A total of 40 minutes was spent with patient more than half of which was spent in counseling, reviewing records from other prviders and coordination of care.   Updated Medication List Outpatient Encounter Prescriptions as of 07/31/2013  Medication Sig Dispense Refill  . Artificial Tear Ointment (ARTIFICIAL TEARS) ointment Place 1 drop into both eyes as needed.      Marland Kitchen aspirin 81 MG tablet Take 81 mg by mouth daily.        . Calcium Carbonate-Vit D-Min (CALCIUM 1200 PO) Take 1,200 mg by mouth daily.      . Calcium Carbonate-Vit D-Min (CALCIUM 1200) 1200-1000 MG-UNIT CHEW Chew 1 tablet by mouth daily.        . cetirizine (ZYRTEC) 10 MG tablet Take 10 mg by mouth daily.        . hyoscyamine (LEVSIN SL) 0.125 MG SL tablet Place 1 tablet (0.125 mg total) under the tongue every 4 (four) hours as needed for cramping.  30 tablet  0  . pantoprazole (PROTONIX) 40 MG tablet Two times daily for one week, then daily thereafter  90 tablet  3  . [DISCONTINUED] hyoscyamine (LEVSIN SL) 0.125 MG SL tablet Place 1 tablet (0.125 mg total) under the tongue every 4 (four) hours as needed for cramping.  30 tablet  0  . [DISCONTINUED] pantoprazole (PROTONIX) 40 MG tablet Two times daily for  one week, then daily thereafter  90 tablet  3   No facility-administered encounter medications on file as of 07/31/2013.

## 2013-08-01 ENCOUNTER — Encounter: Payer: Self-pay | Admitting: Internal Medicine

## 2013-08-01 LAB — POC HEMOCCULT BLD/STL (OFFICE/1-CARD/DIAGNOSTIC): Fecal Occult Blood, POC: POSITIVE

## 2013-08-01 NOTE — Assessment & Plan Note (Signed)
History is consistent with acute esophagitis.  She exercises regularly and vigorously and has no history of exertional chest pain. Resume PPI therapy with protonix bid and if symptoms persist, will refer to cardiology for risk stratification.

## 2013-08-01 NOTE — Assessment & Plan Note (Addendum)
FOBT was faintly positive and she is mildly tender to deep palpation in the gastric area.  Elaine Price treat with protonix bid,   H Pylori IgG was positive.  will treat with clarithromycin and amoxicillin x 2 weeks .  Continue protonix twice daily

## 2013-08-01 NOTE — Assessment & Plan Note (Signed)
Home bps have been normal,  History of white coat hypertension

## 2013-08-03 ENCOUNTER — Encounter: Payer: Self-pay | Admitting: Internal Medicine

## 2013-08-03 DIAGNOSIS — K2970 Gastritis, unspecified, without bleeding: Secondary | ICD-10-CM

## 2013-08-03 LAB — H. PYLORI ANTIBODY, IGG: H Pylori IgG: >8 {ISR} — ABNORMAL HIGH

## 2013-08-03 MED ORDER — AMOXICILLIN 250 MG PO CAPS: 1000.0000 mg | ORAL_CAPSULE | Freq: Two times a day (BID) | ORAL | Status: DC

## 2013-08-03 MED ORDER — CLARITHROMYCIN 500 MG PO TABS: 500.0000 mg | ORAL_TABLET | Freq: Two times a day (BID) | ORAL | Status: DC

## 2013-08-03 NOTE — Addendum Note (Signed)
Addended by: Sherlene Shams on: 08/03/2013 10:54 PM   Modules accepted: Orders

## 2013-08-05 ENCOUNTER — Other Ambulatory Visit: Payer: Self-pay | Admitting: Internal Medicine

## 2013-08-05 ENCOUNTER — Telehealth: Payer: Self-pay | Admitting: *Deleted

## 2013-08-05 DIAGNOSIS — K2970 Gastritis, unspecified, without bleeding: Secondary | ICD-10-CM

## 2013-08-05 MED ORDER — METRONIDAZOLE 500 MG PO TABS: 500.0000 mg | ORAL_TABLET | Freq: Three times a day (TID) | ORAL | Status: DC

## 2013-08-05 MED ORDER — AMOXICILLIN 250 MG PO CAPS: 1000.0000 mg | ORAL_CAPSULE | Freq: Two times a day (BID) | ORAL | Status: DC

## 2013-08-05 MED ORDER — CLARITHROMYCIN 500 MG PO TABS: 500.0000 mg | ORAL_TABLET | Freq: Two times a day (BID) | ORAL | Status: DC

## 2013-08-05 NOTE — Telephone Encounter (Signed)
All treatment regimens for  H Pylori require two antibiotics taken simultaneously, so it makes no sense that her insurance would refuse to pay for the biaxin.  i have sent an alternative ,  Metronidazole 500 mg three times daily for 2 weeks.  It she hsa to pay for it out of pocket it shouldn't be much.  However she cannot drink any alcohol for two weeks while taking it.

## 2013-08-05 NOTE — Telephone Encounter (Signed)
Notified pt of unread myChart message.Pt requested meds to go to [facility]. Rxs sent to pharmacy by escript

## 2013-08-05 NOTE — Telephone Encounter (Signed)
Patient insurance will not cover two ABX given at the same time, patient picked up the amoxicillin but the Biaxin is 141.00 out of pocket patient stated she cannot do this please advise.

## 2013-08-05 NOTE — Telephone Encounter (Signed)
Patient notified as requested. 

## 2013-08-19 ENCOUNTER — Telehealth: Payer: Self-pay | Admitting: Internal Medicine

## 2013-08-19 NOTE — Telephone Encounter (Signed)
If her pain persists despite treating her for H Pylori, she will need to be referred to a GI specialist for endoscopy to rule out ulcer.  Does she have a preference?

## 2013-08-19 NOTE — Telephone Encounter (Signed)
Pt came by this morning and had a question about an antibiotic that Dr. Darrick Huntsman prescribed for her for her ulcer and she says that it has not helped and wanted to speak with a nurse.

## 2013-08-19 NOTE — Telephone Encounter (Signed)
Pt states she has completed the Biaxin and Amoxicillin (ended up filling the Biaxin and not the Flagyl) states epigastric pain has improved a little but has not gone away completely. Denies any other symptoms. She is taking the Protonix daily. She stopped her aspirin 81 mg daily, but wants to know is she should resume it and what else to do from here.

## 2013-08-19 NOTE — Telephone Encounter (Signed)
Pt notified, declines referral at this time. Wants to think about it and will call us back.

## 2013-10-05 ENCOUNTER — Telehealth: Payer: Self-pay | Admitting: Internal Medicine

## 2013-10-05 NOTE — Telephone Encounter (Signed)
Pt spouse has stage IV cancer.  Pt is asking for medication for anxiety to help her through.  Scheduled to see R. Rey 12/2.  Pt would prefer to speak with Dr. Darrick Huntsman if possible at least by phone.

## 2013-10-05 NOTE — Telephone Encounter (Signed)
I can try to call her during my lunch break,  But given what she has told you, I do not need to see her to prescribe something.  Has she ever taken alprazola, lorazepam ir valium before?

## 2013-10-05 NOTE — Telephone Encounter (Signed)
Please advise 

## 2013-10-06 ENCOUNTER — Ambulatory Visit (INDEPENDENT_AMBULATORY_CARE_PROVIDER_SITE_OTHER): Payer: Medicare Other | Admitting: Adult Health

## 2013-10-06 ENCOUNTER — Encounter: Payer: Self-pay | Admitting: Adult Health

## 2013-10-06 VITALS — BP 138/78 | HR 76 | Temp 98.4°F | Resp 14 | Wt 170.0 lb

## 2013-10-06 DIAGNOSIS — F418 Other specified anxiety disorders: Secondary | ICD-10-CM

## 2013-10-06 DIAGNOSIS — F411 Generalized anxiety disorder: Secondary | ICD-10-CM | POA: Diagnosis not present

## 2013-10-06 DIAGNOSIS — F4329 Adjustment disorder with other symptoms: Secondary | ICD-10-CM | POA: Insufficient documentation

## 2013-10-06 MED ORDER — ALPRAZOLAM 0.25 MG PO TABS: ORAL_TABLET | ORAL | Status: DC

## 2013-10-06 NOTE — Telephone Encounter (Signed)
Pt returned call.  Will be going out with her husband for a scan shortly.  States may leave msg on home phone.  Pt was seen by R. Rey this a.m.  Does not want to fill rx until speaks with Dr. Altamese Cabal.

## 2013-10-06 NOTE — Telephone Encounter (Signed)
Patient notified as requested. Saw Elaine Price.

## 2013-10-06 NOTE — Progress Notes (Signed)
   Subjective:    Patient ID: Elaine Price, female    DOB: [birth year], 78 y.o.   MRN: [id number]  HPI Patient is a pleasant 78 year old female who presents to clinic requesting assistance with her anxiety. Patient has been caring for her husband with stage IV colon cancer. She reports when she becomes anxious it triggers anxiety in her husband. She would like medication to take only as needed when she feels slightly anxious. She denies feeling depressed.   Current Outpatient Prescriptions on File Prior to Visit  Medication Sig Dispense Refill  . Artificial Tear Ointment (ARTIFICIAL TEARS) ointment Place 1 drop into both eyes as needed.      Marland Kitchen aspirin 81 MG tablet Take 81 mg by mouth daily.        . Calcium Carbonate-Vit D-Min (CALCIUM 1200 PO) Take 1,200 mg by mouth daily.      . Calcium Carbonate-Vit D-Min (CALCIUM 1200) 1200-1000 MG-UNIT CHEW Chew 1 tablet by mouth daily.        . cetirizine (ZYRTEC) 10 MG tablet Take 10 mg by mouth daily.        . hyoscyamine (LEVSIN SL) 0.125 MG SL tablet Place 1 tablet (0.125 mg total) under the tongue every 4 (four) hours as needed for cramping.  30 tablet  0   No current facility-administered medications on file prior to visit.    Review of Systems  Psychiatric/Behavioral: Negative for suicidal ideas, hallucinations, behavioral problems, confusion, sleep disturbance, self-injury, decreased concentration and agitation. The patient is nervous/anxious. The patient is not hyperactive.        Objective:   Physical Exam  Constitutional: She is oriented to person, place, and time. She appears well-developed and well-nourished. No distress.  Cardiovascular: Normal rate and regular rhythm.   Pulmonary/Chest: Effort normal. No respiratory distress.  Musculoskeletal: Normal range of motion.  Neurological: She is alert and oriented to person, place, and time.  Skin: Skin is warm and dry.  Psychiatric: She has a normal mood and affect. Her behavior is  normal. Judgment and thought content normal.          Assessment & Plan:

## 2013-10-06 NOTE — Progress Notes (Signed)
Pre visit review using our clinic review tool, if applicable. No additional management support is needed unless otherwise documented below in the visit note. 

## 2013-10-06 NOTE — Assessment & Plan Note (Signed)
Patient caring for her husband with stage IV colon cancer. Start xanax 0.25 mg daily as needed for anxiety. Advised that medication may cause some sedation especially since she has not taken this before. Advised to avoid driving until she is aware of effects when she takes medication. Pt agreed.

## 2013-10-06 NOTE — Telephone Encounter (Signed)
Left message for patient to return call.

## 2013-11-12 ENCOUNTER — Ambulatory Visit (INDEPENDENT_AMBULATORY_CARE_PROVIDER_SITE_OTHER): Payer: Medicare Other | Admitting: Internal Medicine

## 2013-11-12 ENCOUNTER — Encounter: Payer: Self-pay | Admitting: Internal Medicine

## 2013-11-12 VITALS — BP 158/78 | HR 63 | Temp 98.1°F | Resp 16 | Wt 176.2 lb

## 2013-11-12 DIAGNOSIS — F411 Generalized anxiety disorder: Secondary | ICD-10-CM

## 2013-11-12 DIAGNOSIS — F418 Other specified anxiety disorders: Secondary | ICD-10-CM

## 2013-11-12 MED ORDER — ALPRAZOLAM 1 MG PO TABS: ORAL_TABLET | ORAL | Status: DC

## 2013-11-12 NOTE — Assessment & Plan Note (Signed)
Discussed alternative meds vis increasing the alprazolam dose. Will increase dose to 1 mg and start with 1/2 tablet .  Max twice daily.  Discussed addictive potential of medication and need to call if increased use occurs.

## 2013-11-12 NOTE — Patient Instructions (Signed)
This new alprazolam is 4 times the strength of your previous prescription  Start with 1/2 tablet ,  You can take another 1/2 in 20 minutes if no change in anxiety level  You may use this up to twice daily as needed,  If you feel you need it 3 times daily,  please let me know so we can prescribe an alternative that lasts longer

## 2013-11-12 NOTE — Progress Notes (Addendum)
Patient ID: Elaine Price, female   DOB: 1935/02/28, 79 y.o.   MRN: [id number]   Patient Active Problem List   Diagnosis Date Noted  . Situational anxiety 10/06/2013  . Unspecified gastritis and gastroduodenitis without mention of hemorrhage 07/31/2013  . Routine general medical examination at a health care facility 01/19/2013  . Esophagitis, reflux   . Back pain, thoracic 10/02/2011  . Elevated blood-pressure reading without diagnosis of hypertension 07/03/2011  . Abdominal pain, acute, epigastric 07/03/2011    Subjective:  CC:   Chief Complaint  Patient presents with  . Acute Visit    anxiety    HPI:   Elaine Price a 79 y.o. female who presents with increased anxiety .  Patient is a walk in today.  Her husband is dying of stage IV colon cancer and patient was seen on Dec 2 by RR for same,.  Given alprazolam 0.25 mg dose .  Has not had any relief,  Feels no effect.  Finding herself being irritable toward husband because he is paranoid and inconsolable due to severe pain. Denies insomnia but needs something stronger to manage daytime anxiety,   Past Medical History  Diagnosis Date  . Diverticulosis   . Gallstones   . Hyperlipemia   . Hypertension     hx of  . Pneumonia   . Gastritis and duodenitis     dounf by CT Nov 2012    Past Surgical History  Procedure Laterality Date  . Abdominal hysterectomy    . Tonsilectomy, adenoidectomy, bilateral myringotomy and tubes      as a child, does not remember date  . Appendectomy    . Cholecystectomy, laparoscopic  08/06/2011    gallstones  . Colonoscopy  02/18/2006    2 mm rectal polyp, diverticulosis  . Cholecystectomy  2012       The following portions of the patient's history were reviewed and updated as appropriate: Allergies, current medications, and problem list.    Review of Systems:   12 Pt  review of systems was negative except those addressed in the HPI,     History   Social History  .  Marital Status: Married    Spouse Name: N/A    Number of Children: N/A  . Years of Education: N/A   Occupational History  . retired    Social History Main Topics  . Smoking status: Never Smoker   . Smokeless tobacco: Never Used  . Alcohol Use: No     Comment: rare  . Drug Use: No  . Sexual Activity: Yes   Other Topics Concern  . Not on file   Social History Narrative  . No narrative on file    Objective:  Filed Vitals:   11/12/13 1146  BP: 158/78  Pulse: 63  Temp: 98.1 F (36.7 C)  Resp: 16     General appearance: alert, cooperative and appears stated age Neck: no adenopathy, no carotid bruit, supple, symmetrical, trachea midline and thyroid not enlarged, symmetric, no tenderness/mass/nodules Lungs: clear to auscultation bilaterally Heart: regular rate and rhythm, S1, S2 normal, no murmur, click, rub or gallop Psych: anxious, but appropriate,  Makes good eye contact.  Not fidgety  Assessment and Plan:  Situational anxiety Discussed alternative meds vs increasing the alprazolam dose. Will increase alprazolam dose to 1 mg and start with 1/2 tablet .  Max use twice daily.  Discussed addictive potential of medication and need to call MD  if increased use occurs.    Updated Medication  List Outpatient Encounter Prescriptions as of 11/12/2013  Medication Sig  . ALPRAZolam (XANAX) 1 MG tablet Take 1/2 to 1 tablet once or twice daily as needed for anxiety  . Artificial Tear Ointment (ARTIFICIAL TEARS) ointment Place 1 drop into both eyes as needed.  . Calcium Carbonate-Vit D-Min (CALCIUM 1200 PO) Take 1,200 mg by mouth daily.  . Calcium Carbonate-Vit D-Min (CALCIUM 1200) 1200-1000 MG-UNIT CHEW Chew 1 tablet by mouth daily.    . [DISCONTINUED] ALPRAZolam (XANAX) 0.25 MG tablet Take 1 tablet daily as needed for anxiety  . aspirin 81 MG tablet Take 81 mg by mouth daily.    . cetirizine (ZYRTEC) 10 MG tablet Take 10 mg by mouth daily.    . hyoscyamine (LEVSIN SL) 0.125 MG SL  tablet Place 1 tablet (0.125 mg total) under the tongue every 4 (four) hours as needed for cramping.

## 2013-12-14 ENCOUNTER — Encounter: Payer: Self-pay | Admitting: Internal Medicine

## 2013-12-15 MED ORDER — DIAZEPAM 5 MG PO TABS: 5.0000 mg | ORAL_TABLET | Freq: Every day | ORAL | Status: DC | PRN

## 2013-12-15 NOTE — Telephone Encounter (Signed)
Please call in the rx for generic valium to [facility] this morning,  thanks

## 2013-12-16 ENCOUNTER — Telehealth: Payer: Self-pay | Admitting: Internal Medicine

## 2013-12-16 NOTE — Telephone Encounter (Signed)
Script phoned to pharmacy as requested.

## 2013-12-16 NOTE — Telephone Encounter (Signed)
Pt states she went to pharmacy and they told her they did not have the new prescription for generic valium.  She states [facility]  filled the old prescription that was not working for her again.  Pt asking if we can call [facility].

## 2014-02-04 ENCOUNTER — Ambulatory Visit (INDEPENDENT_AMBULATORY_CARE_PROVIDER_SITE_OTHER): Payer: Medicare Other | Admitting: Internal Medicine

## 2014-02-04 ENCOUNTER — Encounter: Payer: Self-pay | Admitting: Internal Medicine

## 2014-02-04 VITALS — BP 140/70 | HR 72 | Temp 98.1°F | Resp 16 | Ht 66.5 in | Wt 172.0 lb

## 2014-02-04 DIAGNOSIS — E785 Hyperlipidemia, unspecified: Secondary | ICD-10-CM | POA: Diagnosis not present

## 2014-02-04 DIAGNOSIS — F411 Generalized anxiety disorder: Secondary | ICD-10-CM | POA: Diagnosis not present

## 2014-02-04 DIAGNOSIS — Z0000 Encounter for general adult medical examination without abnormal findings: Secondary | ICD-10-CM

## 2014-02-04 DIAGNOSIS — Z1211 Encounter for screening for malignant neoplasm of colon: Secondary | ICD-10-CM | POA: Diagnosis not present

## 2014-02-04 DIAGNOSIS — R5381 Other malaise: Secondary | ICD-10-CM | POA: Diagnosis not present

## 2014-02-04 DIAGNOSIS — R5383 Other fatigue: Secondary | ICD-10-CM

## 2014-02-04 DIAGNOSIS — E559 Vitamin D deficiency, unspecified: Secondary | ICD-10-CM | POA: Diagnosis not present

## 2014-02-04 DIAGNOSIS — F418 Other specified anxiety disorders: Secondary | ICD-10-CM

## 2014-02-04 LAB — COMPREHENSIVE METABOLIC PANEL
ALBUMIN: 4.1 g/dL (ref 3.5–5.2)
ALT: 15 U/L (ref 0–35)
AST: 20 U/L (ref 0–37)
Alkaline Phosphatase: 77 U/L (ref 39–117)
BUN: 14 mg/dL (ref 6–23)
CO2: 27 mEq/L (ref 19–32)
CREATININE: 0.6 mg/dL (ref 0.4–1.2)
Calcium: 9.2 mg/dL (ref 8.4–10.5)
Chloride: 101 mEq/L (ref 96–112)
GFR: 95.69 mL/min (ref >60.00)
Glucose, Bld: 79 mg/dL (ref 70–99)
POTASSIUM: 3.9 meq/L (ref 3.5–5.1)
Sodium: 139 mEq/L (ref 135–145)
Total Bilirubin: 0.4 mg/dL (ref 0.3–1.2)
Total Protein: 7.1 g/dL (ref 6.0–8.3)

## 2014-02-04 LAB — CBC WITH DIFFERENTIAL/PLATELET
Basophils Absolute: 0 10*3/uL (ref 0.0–0.1)
Basophils Relative: 0.6 % (ref 0.0–3.0)
EOS ABS: 0 10*3/uL (ref 0.0–0.7)
Eosinophils Relative: 0.7 % (ref 0.0–5.0)
HCT: 40.6 % (ref 36.0–46.0)
HEMOGLOBIN: 13.2 g/dL (ref 12.0–15.0)
LYMPHS PCT: 30.5 % (ref 12.0–46.0)
Lymphs Abs: 1.9 10*3/uL (ref 0.7–4.0)
MCHC: 32.6 g/dL (ref 30.0–36.0)
MCV: 90.6 fl (ref 78.0–100.0)
MONO ABS: 0.4 10*3/uL (ref 0.1–1.0)
Monocytes Relative: 5.9 % (ref 3.0–12.0)
NEUTROS ABS: 3.9 10*3/uL (ref 1.4–7.7)
Neutrophils Relative %: 62.3 % (ref 43.0–77.0)
Platelets: 266 10*3/uL (ref 150.0–400.0)
RBC: 4.48 Mil/uL (ref 3.87–5.11)
RDW: 13.6 % (ref 11.5–14.6)
WBC: 6.2 10*3/uL (ref 4.5–10.5)

## 2014-02-04 LAB — LIPID PANEL
CHOL/HDL RATIO: 3
Cholesterol: 234 mg/dL — ABNORMAL HIGH (ref 0–200)
HDL: 69.4 mg/dL (ref >39.00)
LDL Cholesterol: 148 mg/dL — ABNORMAL HIGH (ref 0–99)
Triglycerides: 82 mg/dL (ref 0.0–149.0)
VLDL: 16.4 mg/dL (ref 0.0–40.0)

## 2014-02-04 LAB — TSH: TSH: 1.15 u[IU]/mL (ref 0.35–5.50)

## 2014-02-04 MED ORDER — BUSPIRONE HCL 7.5 MG PO TABS: 7.5000 mg | ORAL_TABLET | Freq: Three times a day (TID) | ORAL | Status: DC

## 2014-02-04 MED ORDER — TETANUS-DIPHTH-ACELL PERTUSSIS 5-2.5-18.5 LF-MCG/0.5 IM SUSP: 0.5000 mL | Freq: Once | INTRAMUSCULAR | Status: DC
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
## 2014-02-04 NOTE — Progress Notes (Signed)
Pre-visit discussion using our clinic review tool. No additional management support is needed unless otherwise documented below in the visit note.  

## 2014-02-04 NOTE — Patient Instructions (Addendum)
You had your annual Medicare wellness exam today  We will schedule your mammogram in August.  Please use the stool kit to send [place] back a sample to test for blood.  This is your colon CA screening test. If it is positive we will send you to GI earlier than the expected 2017 date for colonoscopy  You need to have a TDaP vaccine I have given you prescription because it will be cheaper at the health Dept or at your  local pharmacy because Medicare will not reimburse for them.   We will contact you with the bloodwork results  For your anxiety:    Trial of buspirone 7.5 mg up to 3 times daily to manage anxiety  You can start with 7.5 mg twice daily,  And increase the dose after two weeks if you feel it is too weak to 15 mg twice daily   [place] MyChart to let me know how you are doing

## 2014-02-05 LAB — VITAMIN D 25 HYDROXY (VIT D DEFICIENCY, FRACTURES): VIT D 25 HYDROXY: 30 ng/mL (ref 30–89)

## 2014-02-06 DIAGNOSIS — F411 Generalized anxiety disorder: Secondary | ICD-10-CM | POA: Insufficient documentation

## 2014-02-06 NOTE — Assessment & Plan Note (Signed)
nnual exam including breast  Without pelvic  were done today. fobts given

## 2014-02-06 NOTE — Assessment & Plan Note (Signed)
Secondary to significant family and financial stressors (see HPI). Discussed trial of buspirone for  twice daily use.

## 2014-02-06 NOTE — Assessment & Plan Note (Deleted)
Secondary to significatn family and financial stressors (see HPI). Discussed trial of buspirone for  twice daily use.

## 2014-02-07 ENCOUNTER — Encounter: Payer: Self-pay | Admitting: Internal Medicine

## 2014-02-08 ENCOUNTER — Other Ambulatory Visit (INDEPENDENT_AMBULATORY_CARE_PROVIDER_SITE_OTHER): Payer: Medicare Other

## 2014-02-08 DIAGNOSIS — Z1211 Encounter for screening for malignant neoplasm of colon: Secondary | ICD-10-CM

## 2014-02-08 LAB — FECAL OCCULT BLOOD, IMMUNOCHEMICAL: Fecal Occult Bld: NEGATIVE

## 2014-02-09 ENCOUNTER — Encounter: Payer: Self-pay | Admitting: Internal Medicine

## 2014-02-09 NOTE — Telephone Encounter (Signed)
Unread mychart message mailed  

## 2014-02-15 DIAGNOSIS — H04129 Dry eye syndrome of unspecified lacrimal gland: Secondary | ICD-10-CM | POA: Diagnosis not present

## 2014-04-22 DIAGNOSIS — S058X9A Other injuries of unspecified eye and orbit, initial encounter: Secondary | ICD-10-CM | POA: Diagnosis not present

## 2014-04-26 ENCOUNTER — Other Ambulatory Visit: Payer: Self-pay | Admitting: Internal Medicine

## 2014-04-26 DIAGNOSIS — S058X9A Other injuries of unspecified eye and orbit, initial encounter: Secondary | ICD-10-CM | POA: Diagnosis not present

## 2014-04-30 DIAGNOSIS — S058X9A Other injuries of unspecified eye and orbit, initial encounter: Secondary | ICD-10-CM | POA: Diagnosis not present

## 2014-06-07 ENCOUNTER — Telehealth: Payer: Self-pay | Admitting: Internal Medicine

## 2014-06-07 MED ORDER — BUSPIRONE HCL 7.5 MG PO TABS: ORAL_TABLET | ORAL | Status: DC

## 2014-06-07 NOTE — Telephone Encounter (Signed)
Patient called and requested refill on Buspirone 7.5 mg, patient stated she has recently lost husband and needs refill and would like to ask if she can an extra refill added to script so as not to have to call monthly.

## 2014-06-07 NOTE — Telephone Encounter (Signed)
Ok to refill,  Refill sent with additional refills

## 2014-06-08 NOTE — Telephone Encounter (Signed)
Patient notified script called to pharmacy 

## 2014-06-16 ENCOUNTER — Other Ambulatory Visit: Payer: Self-pay | Admitting: Internal Medicine

## 2014-06-16 DIAGNOSIS — Z1231 Encounter for screening mammogram for malignant neoplasm of breast: Secondary | ICD-10-CM

## 2014-07-06 DIAGNOSIS — H04129 Dry eye syndrome of unspecified lacrimal gland: Secondary | ICD-10-CM | POA: Diagnosis not present

## 2014-07-15 ENCOUNTER — Ambulatory Visit (HOSPITAL_COMMUNITY)
Admission: RE | Admit: 2014-07-15 | Discharge: 2014-07-15 | Disposition: A | Discharge status: Home / Self Care | Payer: Medicare Other | Source: Ambulatory Visit | Attending: Internal Medicine | Admitting: Internal Medicine

## 2014-07-15 ENCOUNTER — Other Ambulatory Visit: Payer: Self-pay | Admitting: Internal Medicine

## 2014-07-15 DIAGNOSIS — Z1231 Encounter for screening mammogram for malignant neoplasm of breast: Secondary | ICD-10-CM | POA: Diagnosis not present

## 2014-07-16 ENCOUNTER — Encounter: Payer: Self-pay | Admitting: Internal Medicine

## 2014-07-21 NOTE — Telephone Encounter (Signed)
Mailed unread message to pt  

## 2014-07-29 DIAGNOSIS — Z23 Encounter for immunization: Secondary | ICD-10-CM | POA: Diagnosis not present

## 2014-10-21 ENCOUNTER — Telehealth: Payer: Self-pay | Admitting: *Deleted

## 2014-10-21 NOTE — Telephone Encounter (Signed)
Caryl Pina from [facility] called states pts BP was elevated today. 170/80, pt also reported to her that she was dizzy pt is not taking any BP medications at this time.  Caryl Pina believes it is due to anxiety as this is the first holiday season since pts husband died.  Pt wanted Caryl Pina to make Dr Derrel Nip aware of this.

## 2014-10-21 NOTE — Telephone Encounter (Signed)
Patient should be offered an appt in January and have BP checked a few more times before then

## 2014-10-22 NOTE — Telephone Encounter (Signed)
Spoke to patient, and advised on Dr. Lupita Dawn message. Appt scheduled 11/23/14. Advised to call back with elevated BPs or any new problems prior to that appointment,  verbalized understanding

## 2014-11-18 ENCOUNTER — Other Ambulatory Visit: Payer: Self-pay | Admitting: *Deleted

## 2014-11-18 MED ORDER — BUSPIRONE HCL 7.5 MG PO TABS: ORAL_TABLET | ORAL | Status: DC

## 2014-11-18 NOTE — Telephone Encounter (Signed)
OV sch 11/23/14

## 2014-11-22 ENCOUNTER — Telehealth: Payer: Self-pay | Admitting: *Deleted

## 2014-11-22 NOTE — Telephone Encounter (Signed)
A user error has taken place.

## 2014-11-23 ENCOUNTER — Ambulatory Visit (INDEPENDENT_AMBULATORY_CARE_PROVIDER_SITE_OTHER): Payer: Medicare Other | Admitting: Internal Medicine

## 2014-11-23 ENCOUNTER — Encounter: Payer: Self-pay | Admitting: Internal Medicine

## 2014-11-23 VITALS — BP 144/78 | HR 65 | Temp 97.5°F | Resp 16 | Ht 67.0 in | Wt 163.5 lb

## 2014-11-23 DIAGNOSIS — Z23 Encounter for immunization: Secondary | ICD-10-CM

## 2014-11-23 DIAGNOSIS — F4329 Adjustment disorder with other symptoms: Secondary | ICD-10-CM

## 2014-11-23 DIAGNOSIS — F4321 Adjustment disorder with depressed mood: Secondary | ICD-10-CM

## 2014-11-23 DIAGNOSIS — F4381 Prolonged grief disorder: Secondary | ICD-10-CM

## 2014-11-23 NOTE — Progress Notes (Signed)
Pre-visit discussion using our clinic review tool. No additional management support is needed unless otherwise documented below in the visit note.  

## 2014-11-23 NOTE — Progress Notes (Signed)
Patient ID: Elaine Price, female   DOB: 06/20/1935, 80 y.o.   MRN: [id number]  Patient Active Problem List   Diagnosis Date Noted  . Generalized anxiety disorder 02/06/2014  . Prolonged grief reaction 10/06/2013  . Unspecified gastritis and gastroduodenitis without mention of hemorrhage 07/31/2013  . Routine general medical examination at a health care facility 01/19/2013  . Esophagitis, reflux   . Back pain, thoracic 10/02/2011  . Elevated blood-pressure reading without diagnosis of hypertension 07/03/2011  . Abdominal pain, acute, epigastric 07/03/2011    Subjective:  CC:   Chief Complaint  Patient presents with  . Follow-up    Dizziness about a month ago saw nurse at [facility] advised might be dehydration.    HPI:   Elaine Price is a 80 y.o. female who presents for  A eecent episode of dizziness,  Patient has been grieving the loss of her husband of 43 years , Barnabas Lister.  He died in the summer after losing a battle to cancer. . She has lost weight and was not eating well.  She is not exercising yet, which used to be her daily routine.  Several week ago she was having recurrent dizziness unaccompanied by chest pain, shortness of breath, headache and vision changes  And was seen by the RN at [facility] who felt that she was dehydrated.  She has been consciously increasing her fluid intake and has not had a recurrence of symptoms.  30 minutes was spent with patient today in discussing healthy ways to manage her grief and loneliness.  She is having some difficulty initiating and maintaining sleep and has had some loss of appetite.  She has good social support, however,  And has not been avoiding the company of others.    Past Medical History  Diagnosis Date  . Diverticulosis   . Gallstones   . Hyperlipemia   . Hypertension     hx of  . Pneumonia   . Gastritis and duodenitis     dounf by CT Nov 2012    Past Surgical History  Procedure Laterality Date  . Abdominal  hysterectomy    . Tonsilectomy, adenoidectomy, bilateral myringotomy and tubes      as a child, does not remember date  . Appendectomy    . Cholecystectomy, laparoscopic  08/06/2011    gallstones  . Colonoscopy  02/18/2006    2 mm rectal polyp, diverticulosis  . Cholecystectomy  2012       The following portions of the patient's history were reviewed and updated as appropriate: Allergies, current medications, and problem list.    Review of Systems:   Patient denies headache, fevers, malaise, unintentional weight loss, skin rash, eye pain, sinus congestion and sinus pain, sore throat, dysphagia,  hemoptysis , cough, dyspnea, wheezing, chest pain, palpitations, orthopnea, edema, abdominal pain, nausea, melena, diarrhea, constipation, flank pain, dysuria, hematuria, urinary  Frequency, nocturia, numbness, tingling, seizures,  Focal weakness, Loss of consciousness,  Tremor, insomnia, depression, anxiety, and suicidal ideation.     History   Social History  . Marital Status: Married    Spouse Name: N/A    Number of Children: N/A  . Years of Education: N/A   Occupational History  . retired    Social History Main Topics  . Smoking status: Never Smoker   . Smokeless tobacco: Never Used  . Alcohol Use: No     Comment: rare  . Drug Use: No  . Sexual Activity: Yes   Other Topics Concern  .  Not on file   Social History Narrative    Objective:  Filed Vitals:   11/23/14 1527  BP: 144/78  Pulse: 65  Temp: 97.5 F (36.4 C)  Resp: 16     General appearance: alert, cooperative and appears stated age Ears: normal TM's and external ear canals both ears Throat: lips, mucosa, and tongue normal; teeth and gums normal Neck: no adenopathy, no carotid bruit, supple, symmetrical, trachea midline and thyroid not enlarged, symmetric, no tenderness/mass/nodules Back: symmetric, no curvature. ROM normal. No CVA tenderness. Lungs: clear to auscultation bilaterally Heart: regular  rate and rhythm, S1, S2 normal, no murmur, click, rub or gallop Abdomen: soft, non-tender; bowel sounds normal; no masses,  no organomegaly Pulses: 2+ and symmetric Skin: Skin color, texture, turgor normal. No rashes or lesions Lymph nodes: Cervical, supraclavicular, and axillary nodes normal. Psych: affect sad, makes good eye contact. No fidgeting,  Smiles easily.  Denies suicidal thoughts    Assessment and Plan:  Prolonged grief reaction She is not interested in daily pharmacotherapy at this time, but has alprazolam available if needed for anxieyt or insomnia.  The risks and benefits of prn benzodiazepine use were reviewed with patient today including excessive sedation leading to respiratory depression,  impaired thinking/driving, and addiction.  Patient was advised to avoid concurrent use with alcohol, to use medication only as needed and not to share with others  .   A total of 30 minutes of face to face time was spent with patient more than half of which was spent in counselling and coordination of care    Updated Medication List Outpatient Encounter Prescriptions as of 11/23/2014  Medication Sig  . ALPRAZolam (XANAX) 1 MG tablet Take 1/2 to 1 tablet once or twice daily as needed for anxiety (Patient not taking: Reported on 11/23/2014)  . Artificial Tear Ointment (ARTIFICIAL TEARS) ointment Place 1 drop into both eyes as needed.  Marland Kitchen aspirin 81 MG tablet Take 81 mg by mouth daily.    . busPIRone (BUSPAR) 7.5 MG tablet TAKE 1 TABLET BY MOUTH up to THREE TIMES DAILY  . Calcium Carbonate-Vit D-Min (CALCIUM 1200 PO) Take 1,200 mg by mouth daily.  . Calcium Carbonate-Vit D-Min (CALCIUM 1200) 1200-1000 MG-UNIT CHEW Chew 1 tablet by mouth daily.    . cetirizine (ZYRTEC) 10 MG tablet Take 10 mg by mouth daily.    . diazepam (VALIUM) 5 MG tablet Take 1 tablet (5 mg total) by mouth daily as needed for anxiety. (Patient not taking: Reported on 11/23/2014)  . hyoscyamine (LEVSIN SL) 0.125 MG SL  tablet Place 1 tablet (0.125 mg total) under the tongue every 4 (four) hours as needed for cramping. (Patient not taking: Reported on 11/23/2014)  . Tdap (BOOSTRIX) 5-2.5-18.5 LF-MCG/0.5 injection Inject 0.5 mLs into the muscle once.     Orders Placed This Encounter  Procedures  . Pneumococcal polysaccharide vaccine 23-valent greater than or equal to 2yo subcutaneous/IM    No Follow-up on file.

## 2014-11-26 ENCOUNTER — Encounter: Payer: Self-pay | Admitting: Internal Medicine

## 2014-11-26 NOTE — Assessment & Plan Note (Signed)
She is not interested in daily pharmacotherapy at this time. The risks and benefits of prn benzodiazepine use were discussed with patient today including excessive sedation leading to respiratory depression,  impaired thinking/driving, and addiction.  Patient was advised to avoid concurrent use with alcohol, to use medication only as needed and not to share with others  .

## 2014-12-06 ENCOUNTER — Other Ambulatory Visit: Payer: Self-pay | Admitting: *Deleted

## 2014-12-06 MED ORDER — BUSPIRONE HCL 7.5 MG PO TABS: ORAL_TABLET | ORAL | Status: DC

## 2015-01-12 ENCOUNTER — Ambulatory Visit (INDEPENDENT_AMBULATORY_CARE_PROVIDER_SITE_OTHER): Payer: Medicare Other

## 2015-01-12 ENCOUNTER — Ambulatory Visit (INDEPENDENT_AMBULATORY_CARE_PROVIDER_SITE_OTHER): Payer: Medicare Other | Admitting: Podiatry

## 2015-01-12 ENCOUNTER — Encounter: Payer: Self-pay | Admitting: Podiatry

## 2015-01-12 VITALS — BP 137/79 | HR 70 | Resp 16 | Ht 67.0 in | Wt 163.0 lb

## 2015-01-12 DIAGNOSIS — M2010 Hallux valgus (acquired), unspecified foot: Secondary | ICD-10-CM

## 2015-01-12 NOTE — Progress Notes (Signed)
   Subjective:    Patient ID: Elaine Price, female    DOB: 03-Apr-1935, 80 y.o.   MRN: [id number]  HPI I am getting bunions on my feet, they hurt across the top of toe while i am walking the dog. Just want some suggestions on proper shoe gear    Review of Systems  All other systems reviewed and are negative.      Objective:   Physical Exam: I have reviewed her past medical history medications allergy surgery social history and review of systems. Pulses are strongly palpable bilateral. Neurologic sensorium is intact per Semmes-Weinstein monofilament. Deep tendon reflexes are intact bilateral and muscle strength is 5 over 5 dorsiflexion plantar flexors inverters and everters all intrinsic musculature is intact. Orthopedic evaluation demonstrates moderate to severe hallux abductovalgus deformity with dislocation of the first metatarsophalangeal joint. Hammertoe deformity with medial deviation of the second digit of the right foot at the metatarsophalangeal joint. She also has a diastases between the third and fourth metatarsal phalangeal joint as well as adductor varus rotated hammertoe deformity with osteo-3 changes at the PIPJ and DIPJ fourth digit right foot. Radiographs confirmed this. Left foot does demonstrate mild hallux valgus deformity with hammertoe deformities flexible in nature.        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 Dislocation syndrome with hammertoe deformity second right.  Plan: We discussed the etiology pathology conservative versus surgical therapies at this point we did discuss the possible need for surgical intervention otherwise we discussed appropriate shoe gear. Follow up with her as needed

## 2015-01-20 ENCOUNTER — Ambulatory Visit (INDEPENDENT_AMBULATORY_CARE_PROVIDER_SITE_OTHER): Payer: Medicare Other | Admitting: *Deleted

## 2015-01-20 DIAGNOSIS — Z23 Encounter for immunization: Secondary | ICD-10-CM | POA: Diagnosis not present

## 2015-02-22 DIAGNOSIS — H2513 Age-related nuclear cataract, bilateral: Secondary | ICD-10-CM | POA: Diagnosis not present

## 2015-05-30 DIAGNOSIS — S161XXA Strain of muscle, fascia and tendon at neck level, initial encounter: Secondary | ICD-10-CM | POA: Diagnosis not present

## 2015-05-30 DIAGNOSIS — M542 Cervicalgia: Secondary | ICD-10-CM | POA: Diagnosis not present

## 2015-05-30 DIAGNOSIS — M5032 Other cervical disc degeneration, mid-cervical region: Secondary | ICD-10-CM | POA: Diagnosis not present

## 2015-06-17 ENCOUNTER — Other Ambulatory Visit: Payer: Self-pay | Admitting: Internal Medicine

## 2015-06-17 DIAGNOSIS — Z1231 Encounter for screening mammogram for malignant neoplasm of breast: Secondary | ICD-10-CM

## 2015-06-27 ENCOUNTER — Other Ambulatory Visit: Payer: Self-pay | Admitting: Internal Medicine

## 2015-07-01 ENCOUNTER — Encounter (HOSPITAL_BASED_OUTPATIENT_CLINIC_OR_DEPARTMENT_OTHER): Payer: Self-pay | Admitting: *Deleted

## 2015-07-01 ENCOUNTER — Other Ambulatory Visit: Payer: Self-pay | Admitting: Orthopedic Surgery

## 2015-07-01 DIAGNOSIS — S42031A Displaced fracture of lateral end of right clavicle, initial encounter for closed fracture: Secondary | ICD-10-CM | POA: Diagnosis not present

## 2015-07-04 ENCOUNTER — Ambulatory Visit (HOSPITAL_BASED_OUTPATIENT_CLINIC_OR_DEPARTMENT_OTHER): Payer: Medicare Other | Admitting: Anesthesiology

## 2015-07-04 ENCOUNTER — Encounter (HOSPITAL_BASED_OUTPATIENT_CLINIC_OR_DEPARTMENT_OTHER)
Admission: RE | Disposition: A | Discharge status: Home / Self Care | Payer: Self-pay | Source: Ambulatory Visit | Attending: Orthopedic Surgery

## 2015-07-04 ENCOUNTER — Ambulatory Visit (HOSPITAL_BASED_OUTPATIENT_CLINIC_OR_DEPARTMENT_OTHER)
Admission: RE | Admit: 2015-07-04 | Discharge: 2015-07-05 | Disposition: A | Discharge status: Home / Self Care | Payer: Medicare Other | Source: Ambulatory Visit | Attending: Orthopedic Surgery | Admitting: Orthopedic Surgery

## 2015-07-04 ENCOUNTER — Ambulatory Visit (HOSPITAL_COMMUNITY): Payer: Medicare Other

## 2015-07-04 ENCOUNTER — Encounter (HOSPITAL_BASED_OUTPATIENT_CLINIC_OR_DEPARTMENT_OTHER): Payer: Self-pay

## 2015-07-04 DIAGNOSIS — S42031A Displaced fracture of lateral end of right clavicle, initial encounter for closed fracture: Secondary | ICD-10-CM | POA: Diagnosis present

## 2015-07-04 DIAGNOSIS — M25511 Pain in right shoulder: Secondary | ICD-10-CM | POA: Diagnosis not present

## 2015-07-04 DIAGNOSIS — Z419 Encounter for procedure for purposes other than remedying health state, unspecified: Secondary | ICD-10-CM

## 2015-07-04 DIAGNOSIS — S42021A Displaced fracture of shaft of right clavicle, initial encounter for closed fracture: Secondary | ICD-10-CM | POA: Diagnosis not present

## 2015-07-04 DIAGNOSIS — E785 Hyperlipidemia, unspecified: Secondary | ICD-10-CM | POA: Diagnosis not present

## 2015-07-04 DIAGNOSIS — S42001D Fracture of unspecified part of right clavicle, subsequent encounter for fracture with routine healing: Secondary | ICD-10-CM | POA: Diagnosis not present

## 2015-07-04 DIAGNOSIS — S42023A Displaced fracture of shaft of unspecified clavicle, initial encounter for closed fracture: Secondary | ICD-10-CM | POA: Diagnosis present

## 2015-07-04 DIAGNOSIS — G8918 Other acute postprocedural pain: Secondary | ICD-10-CM | POA: Diagnosis not present

## 2015-07-04 HISTORY — DX: Depression, unspecified: F32.A

## 2015-07-04 HISTORY — PX: ACROMIO-CLAVICULAR JOINT REPAIR: SHX5183

## 2015-07-04 HISTORY — DX: Major depressive disorder, single episode, unspecified: F32.9

## 2015-07-04 HISTORY — PX: ORIF CLAVICULAR FRACTURE: SHX5055

## 2015-07-04 SURGERY — OPEN REDUCTION INTERNAL FIXATION (ORIF) CLAVICULAR FRACTURE
Anesthesia: Regional | Site: Shoulder | Laterality: Right

## 2015-07-04 MED ORDER — FENTANYL CITRATE (PF) 100 MCG/2ML IJ SOLN
25.0000 ug | INTRAMUSCULAR | Status: DC | PRN
  Administered 2015-07-04 (×2): 50 ug via INTRAVENOUS

## 2015-07-04 MED ORDER — DOCUSATE SODIUM 100 MG PO CAPS
100.0000 mg | ORAL_CAPSULE | Freq: Two times a day (BID) | ORAL | Status: DC
  Administered 2015-07-04: 100 mg via ORAL
  Filled 2015-07-04: qty 1

## 2015-07-04 MED ORDER — ONDANSETRON HCL 4 MG/2ML IJ SOLN
INTRAMUSCULAR | Status: DC | PRN
Start: 2015-07-04 — End: 2015-07-04
  Administered 2015-07-04: 4 mg via INTRAVENOUS

## 2015-07-04 MED ORDER — ACETAMINOPHEN 10 MG/ML IV SOLN
INTRAVENOUS | Status: AC
  Filled 2015-07-04: qty 100

## 2015-07-04 MED ORDER — SODIUM CHLORIDE 0.9 % IV SOLN
INTRAVENOUS | Status: DC
  Administered 2015-07-04: 16:00:00 via INTRAVENOUS

## 2015-07-04 MED ORDER — TETANUS-DIPHTH-ACELL PERTUSSIS 5-2.5-18.5 LF-MCG/0.5 IM SUSP: 0.5000 mL | Freq: Once | INTRAMUSCULAR | Status: DC

## 2015-07-04 MED ORDER — ALUMINUM HYDROXIDE GEL 320 MG/5ML PO SUSP: 15.0000 mL | ORAL | Status: DC | PRN

## 2015-07-04 MED ORDER — DIPHENHYDRAMINE HCL 12.5 MG/5ML PO ELIX: 12.5000 mg | ORAL_SOLUTION | ORAL | Status: DC | PRN

## 2015-07-04 MED ORDER — FENTANYL CITRATE (PF) 100 MCG/2ML IJ SOLN
INTRAMUSCULAR | Status: AC
  Filled 2015-07-04: qty 2

## 2015-07-04 MED ORDER — MIDAZOLAM HCL 2 MG/2ML IJ SOLN: 1.0000 mg | INTRAMUSCULAR | Status: DC | PRN

## 2015-07-04 MED ORDER — OXYCODONE HCL 5 MG PO TABS
5.0000 mg | ORAL_TABLET | ORAL | Status: DC | PRN
  Administered 2015-07-05: 5 mg via ORAL
  Filled 2015-07-04: qty 1

## 2015-07-04 MED ORDER — MORPHINE SULFATE (PF) 2 MG/ML IV SOLN: 1.0000 mg | INTRAVENOUS | Status: DC | PRN

## 2015-07-04 MED ORDER — BUSPIRONE HCL 15 MG PO TABS: 7.5000 mg | ORAL_TABLET | Freq: Three times a day (TID) | ORAL | Status: DC

## 2015-07-04 MED ORDER — OXYCODONE-ACETAMINOPHEN 5-325 MG PO TABS
ORAL_TABLET | ORAL | Status: AC
  Filled 2015-07-04: qty 1

## 2015-07-04 MED ORDER — ONDANSETRON HCL 4 MG PO TABS
4.0000 mg | ORAL_TABLET | Freq: Four times a day (QID) | ORAL | Status: DC | PRN
Start: 2015-07-04 — End: 2015-07-05

## 2015-07-04 MED ORDER — POVIDONE-IODINE 7.5 % EX SOLN: Freq: Once | CUTANEOUS | Status: DC

## 2015-07-04 MED ORDER — MIDAZOLAM HCL 2 MG/2ML IJ SOLN
INTRAMUSCULAR | Status: AC
  Filled 2015-07-04: qty 2

## 2015-07-04 MED ORDER — PROPOFOL 10 MG/ML IV BOLUS
INTRAVENOUS | Status: DC | PRN
  Administered 2015-07-04: 140 mg via INTRAVENOUS

## 2015-07-04 MED ORDER — CEFAZOLIN SODIUM-DEXTROSE 2-3 GM-% IV SOLR
2.0000 g | INTRAVENOUS | Status: AC
  Administered 2015-07-04: 2 g via INTRAVENOUS

## 2015-07-04 MED ORDER — ACETAMINOPHEN 650 MG RE SUPP: 650.0000 mg | Freq: Four times a day (QID) | RECTAL | Status: DC | PRN

## 2015-07-04 MED ORDER — ASPIRIN EC 325 MG PO TBEC
325.0000 mg | DELAYED_RELEASE_TABLET | Freq: Every day | ORAL | Status: DC
  Administered 2015-07-04: 325 mg via ORAL
  Filled 2015-07-04: qty 1

## 2015-07-04 MED ORDER — GLYCOPYRROLATE 0.2 MG/ML IJ SOLN: 0.2000 mg | Freq: Once | INTRAMUSCULAR | Status: DC | PRN

## 2015-07-04 MED ORDER — FENTANYL CITRATE (PF) 100 MCG/2ML IJ SOLN
50.0000 ug | INTRAMUSCULAR | Status: AC | PRN
  Administered 2015-07-04 (×5): 25 ug via INTRAVENOUS

## 2015-07-04 MED ORDER — LIDOCAINE HCL (CARDIAC) 10 MG/ML IV SOLN
INTRAVENOUS | Status: DC | PRN
  Administered 2015-07-04: 60 mg via INTRAVENOUS

## 2015-07-04 MED ORDER — LACTATED RINGERS IV SOLN
INTRAVENOUS | Status: DC
  Administered 2015-07-04 (×2): via INTRAVENOUS

## 2015-07-04 MED ORDER — DOCUSATE SODIUM 100 MG PO CAPS: 100.0000 mg | ORAL_CAPSULE | Freq: Three times a day (TID) | ORAL | Status: DC | PRN

## 2015-07-04 MED ORDER — SCOPOLAMINE 1 MG/3DAYS TD PT72
1.0000 | MEDICATED_PATCH | Freq: Once | TRANSDERMAL | Status: DC | PRN
Start: 2015-07-04 — End: 2015-07-04

## 2015-07-04 MED ORDER — LABETALOL HCL 5 MG/ML IV SOLN
INTRAVENOUS | Status: AC
  Filled 2015-07-04: qty 4

## 2015-07-04 MED ORDER — OXYCODONE-ACETAMINOPHEN 5-325 MG PO TABS: 1.0000 | ORAL_TABLET | ORAL | Status: DC | PRN

## 2015-07-04 MED ORDER — FENTANYL CITRATE (PF) 100 MCG/2ML IJ SOLN
INTRAMUSCULAR | Status: AC
  Filled 2015-07-04: qty 4

## 2015-07-04 MED ORDER — ACETAMINOPHEN 325 MG PO TABS: 650.0000 mg | ORAL_TABLET | Freq: Four times a day (QID) | ORAL | Status: DC | PRN

## 2015-07-04 MED ORDER — ONDANSETRON HCL 4 MG/2ML IJ SOLN: 4.0000 mg | Freq: Four times a day (QID) | INTRAMUSCULAR | Status: DC | PRN

## 2015-07-04 MED ORDER — METOCLOPRAMIDE HCL 5 MG PO TABS: 5.0000 mg | ORAL_TABLET | Freq: Three times a day (TID) | ORAL | Status: DC | PRN

## 2015-07-04 MED ORDER — MENTHOL 3 MG MT LOZG: 1.0000 | LOZENGE | OROMUCOSAL | Status: DC | PRN

## 2015-07-04 MED ORDER — SUCCINYLCHOLINE CHLORIDE 20 MG/ML IJ SOLN
INTRAMUSCULAR | Status: DC | PRN
  Administered 2015-07-04: 100 mg via INTRAVENOUS

## 2015-07-04 MED ORDER — ACETAMINOPHEN 500 MG PO TABS
1000.0000 mg | ORAL_TABLET | Freq: Four times a day (QID) | ORAL | Status: DC
  Administered 2015-07-04 – 2015-07-05 (×2): 500 mg via ORAL
  Filled 2015-07-04: qty 2

## 2015-07-04 MED ORDER — ONDANSETRON HCL 4 MG/2ML IJ SOLN: 4.0000 mg | Freq: Once | INTRAMUSCULAR | Status: DC | PRN

## 2015-07-04 MED ORDER — PHENOL 1.4 % MT LIQD: 1.0000 | OROMUCOSAL | Status: DC | PRN

## 2015-07-04 MED ORDER — BISACODYL 10 MG RE SUPP: 10.0000 mg | Freq: Every day | RECTAL | Status: DC | PRN

## 2015-07-04 MED ORDER — METOCLOPRAMIDE HCL 5 MG/ML IJ SOLN: 5.0000 mg | Freq: Three times a day (TID) | INTRAMUSCULAR | Status: DC | PRN

## 2015-07-04 MED ORDER — POLYETHYLENE GLYCOL 3350 17 G PO PACK: 17.0000 g | PACK | Freq: Every day | ORAL | Status: DC | PRN

## 2015-07-04 MED ORDER — CEFAZOLIN SODIUM-DEXTROSE 2-3 GM-% IV SOLR
INTRAVENOUS | Status: AC
  Filled 2015-07-04: qty 50

## 2015-07-04 MED ORDER — OXYCODONE-ACETAMINOPHEN 5-325 MG PO TABS
1.0000 | ORAL_TABLET | ORAL | Status: DC | PRN
  Administered 2015-07-04: 1 via ORAL

## 2015-07-04 MED ORDER — LABETALOL HCL 5 MG/ML IV SOLN
10.0000 mg | Freq: Once | INTRAVENOUS | Status: AC
  Administered 2015-07-04: 10 mg via INTRAVENOUS

## 2015-07-04 MED ORDER — DEXAMETHASONE SODIUM PHOSPHATE 4 MG/ML IJ SOLN
INTRAMUSCULAR | Status: DC | PRN
  Administered 2015-07-04: 10 mg via INTRAVENOUS

## 2015-07-04 MED ORDER — ACETAMINOPHEN 10 MG/ML IV SOLN
1000.0000 mg | Freq: Once | INTRAVENOUS | Status: AC
  Administered 2015-07-04: 1000 mg via INTRAVENOUS

## 2015-07-04 SURGICAL SUPPLY — 83 items
BIT DRILL 2.0 LNG QUCK RELEASE (BIT) ×1 IMPLANT
BIT DRILL 2.8X5 QR DISP (BIT) ×3 IMPLANT
BLADE AVERAGE 25MMX9MM (BLADE)
BLADE AVERAGE 25X9 (BLADE) IMPLANT
BLADE CLIPPER SURG (BLADE) IMPLANT
BLADE SURG 15 STRL LF DISP TIS (BLADE) ×1 IMPLANT
BLADE SURG 15 STRL SS (BLADE) ×2
CHLORAPREP W/TINT 26ML (MISCELLANEOUS) ×3 IMPLANT
CLOSURE WOUND 1/2 X4 (GAUZE/BANDAGES/DRESSINGS) ×1
DECANTER SPIKE VIAL GLASS SM (MISCELLANEOUS) IMPLANT
DRAPE C-ARM 42X72 X-RAY (DRAPES) ×3 IMPLANT
DRAPE INCISE IOBAN 66X45 STRL (DRAPES) ×3 IMPLANT
DRAPE SURG 17X23 STRL (DRAPES) ×3 IMPLANT
DRAPE U 20/CS (DRAPES) ×3 IMPLANT
DRAPE U-SHAPE 47X51 STRL (DRAPES) ×3 IMPLANT
DRAPE U-SHAPE 76X120 STRL (DRAPES) ×6 IMPLANT
DRILL 2.0 LNG QUICK RELEASE (BIT) ×3
DRSG TEGADERM 4X4.75 (GAUZE/BANDAGES/DRESSINGS) ×6 IMPLANT
ELECT BLADE 6.5 .24CM SHAFT (ELECTRODE) IMPLANT
ELECT REM PT RETURN 9FT ADLT (ELECTROSURGICAL) ×3
ELECTRODE REM PT RTRN 9FT ADLT (ELECTROSURGICAL) ×1 IMPLANT
GAUZE SPONGE 4X4 12PLY STRL (GAUZE/BANDAGES/DRESSINGS) ×3 IMPLANT
GAUZE SPONGE 4X4 16PLY XRAY LF (GAUZE/BANDAGES/DRESSINGS) IMPLANT
GLOVE BIO SURGEON STRL SZ7 (GLOVE) ×3 IMPLANT
GLOVE BIO SURGEON STRL SZ7.5 (GLOVE) ×3 IMPLANT
GLOVE BIOGEL PI IND STRL 7.0 (GLOVE) ×3 IMPLANT
GLOVE BIOGEL PI IND STRL 8 (GLOVE) ×2 IMPLANT
GLOVE BIOGEL PI INDICATOR 7.0 (GLOVE) ×6
GLOVE BIOGEL PI INDICATOR 8 (GLOVE) ×4
GLOVE ECLIPSE 6.5 STRL STRAW (GLOVE) ×3 IMPLANT
GOWN STRL REUS W/ TWL LRG LVL3 (GOWN DISPOSABLE) ×2 IMPLANT
GOWN STRL REUS W/ TWL XL LVL3 (GOWN DISPOSABLE) ×1 IMPLANT
GOWN STRL REUS W/TWL LRG LVL3 (GOWN DISPOSABLE) ×4
GOWN STRL REUS W/TWL XL LVL3 (GOWN DISPOSABLE) ×2
KIT AC JOINT DISP (KITS) ×3 IMPLANT
KIT BIO-TENODESIS 3X8 DISP (MISCELLANEOUS)
KIT INSRT BABSR STRL DISP BTN (MISCELLANEOUS) IMPLANT
NS IRRIG 1000ML POUR BTL (IV SOLUTION) ×3 IMPLANT
PACK ARTHROSCOPY DSU (CUSTOM PROCEDURE TRAY) ×3 IMPLANT
PACK BASIN DAY SURGERY FS (CUSTOM PROCEDURE TRAY) ×3 IMPLANT
PASSER SUT SWANSON 36MM LOOP (INSTRUMENTS) IMPLANT
PENCIL BUTTON HOLSTER BLD 10FT (ELECTRODE) ×3 IMPLANT
PLATE RIGHT DISTAL CLAVICLE (Plate) ×3 IMPLANT
RETRIEVER SUT HEWSON (MISCELLANEOUS) ×3 IMPLANT
SCREW 2.3X12MM (Screw) ×3 IMPLANT
SCREW CORTICAL LOCKING 2.3X14M (Screw) ×12 IMPLANT
SCREW NON LOCK 3.5X10MM (Screw) ×3 IMPLANT
SCREW NONLOCK HEX 3.5X12 (Screw) ×6 IMPLANT
SHEET MEDIUM DRAPE 40X70 STRL (DRAPES) IMPLANT
SLEEVE SCD COMPRESS KNEE MED (MISCELLANEOUS) ×3 IMPLANT
SLING ARM IMMOBILIZER MED (SOFTGOODS) ×3 IMPLANT
SLING ARM LRG ADULT FOAM STRAP (SOFTGOODS) IMPLANT
SLING ARM MED ADULT FOAM STRAP (SOFTGOODS) IMPLANT
SLING ARM XL FOAM STRAP (SOFTGOODS) IMPLANT
SPONGE LAP 18X18 X RAY DECT (DISPOSABLE) ×3 IMPLANT
SPONGE LAP 4X18 X RAY DECT (DISPOSABLE) IMPLANT
STRIP CLOSURE SKIN 1/2X4 (GAUZE/BANDAGES/DRESSINGS) ×2 IMPLANT
SUCTION FRAZIER TIP 10 FR DISP (SUCTIONS) ×3 IMPLANT
SUPPORT WRAP ARM LG (MISCELLANEOUS) IMPLANT
SUT 2 FIBERLOOP 20 STRT BLUE (SUTURE)
SUT ETHIBOND 2 OS 4 DA (SUTURE) IMPLANT
SUT ETHILON 4 0 PS 2 18 (SUTURE) IMPLANT
SUT FIBERWIRE #2 38 T-5 BLUE (SUTURE)
SUT MNCRL AB 3-0 PS2 18 (SUTURE) IMPLANT
SUT MNCRL AB 4-0 PS2 18 (SUTURE) ×3 IMPLANT
SUT PDS 2 CP NEEDLE XSPECIAL (SUTURE) IMPLANT
SUT PDS AB 0 CT 36 (SUTURE) IMPLANT
SUT PROLENE 3 0 PS 2 (SUTURE) IMPLANT
SUT TIGER TAPE 7 IN WHITE (SUTURE) IMPLANT
SUT VIC AB 0 CT1 27 (SUTURE) ×2
SUT VIC AB 0 CT1 27XBRD ANBCTR (SUTURE) ×1 IMPLANT
SUT VIC AB 2-0 SH 27 (SUTURE) ×2
SUT VIC AB 2-0 SH 27XBRD (SUTURE) ×1 IMPLANT
SUTURE 2 FIBERLOOP 20 STRT BLU (SUTURE) IMPLANT
SUTURE FIBERWR #2 38 T-5 BLUE (SUTURE) IMPLANT
SYR BULB 3OZ (MISCELLANEOUS) ×3 IMPLANT
TAPE FIBER 2MM 7IN #2 BLUE (SUTURE) IMPLANT
TOWEL OR 17X24 6PK STRL BLUE (TOWEL DISPOSABLE) ×3 IMPLANT
TOWEL OR NON WOVEN STRL DISP B (DISPOSABLE) ×3 IMPLANT
TUBE CONNECTING 20'X1/4 (TUBING)
TUBE CONNECTING 20X1/4 (TUBING) IMPLANT
YANKAUER SUCT BULB TIP NO VENT (SUCTIONS) ×3 IMPLANT
ZIPLOOP AC JOINT REPAIR (Orthopedic Implant) ×3 IMPLANT

## 2015-07-04 NOTE — Progress Notes (Signed)
Assisted Dr. Turk with right, ultrasound guided, interscalene  block. Side rails up, monitors on throughout procedure. See vital signs in flow sheet. Tolerated Procedure well. 
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
## 2015-07-04 NOTE — Transfer of Care (Signed)
Immediate Anesthesia Transfer of Care Note  Patient: Elaine Price  Procedure(s) Performed: Procedure(s) with comments: OPEN REDUCTION INTERNAL FIXATION (ORIF) CLAVICULAR FRACTURE (Right) ACROMIO-CLAVICULAR JOINT REPAIR (Right) - Right open reduction internal fixation clavical with allograft, coracoclaviular reconstruction  Patient Location: PACU  Anesthesia Type:GA combined with regional for post-op pain  Level of Consciousness: awake, sedated and patient cooperative  Airway & Oxygen Therapy: Patient Spontanous Breathing and Patient connected to face mask oxygen  Post-op Assessment: Report given to RN and Post -op Vital signs reviewed and stable  Post vital signs: Reviewed and stable  Last Vitals:  Filed Vitals:   07/04/15 1115  BP:   Pulse: 92  Temp:   Resp: 17    Complications: No apparent anesthesia complications
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
## 2015-07-04 NOTE — Anesthesia Preprocedure Evaluation (Addendum)
Anesthesia Evaluation  Patient identified by MRN, date of birth, ID band Patient awake    Reviewed: Allergy & Precautions, NPO status , Patient's Chart, lab work & pertinent test results  History of Anesthesia Complications Negative for: history of anesthetic complications  Airway Mallampati: II  TM Distance: >3 FB Neck ROM: Full    Dental  (+) Teeth Intact, Dental Advisory Given   Pulmonary neg pulmonary ROS,  breath sounds clear to auscultation  Pulmonary exam normal       Cardiovascular Exercise Tolerance: Good - angina- Past MI negative cardio ROS Normal cardiovascular examRhythm:Regular Rate:Normal     Neuro/Psych PSYCHIATRIC DISORDERS Depression negative neurological ROS     GI/Hepatic negative GI ROS, Neg liver ROS,   Endo/Other  negative endocrine ROS  Renal/GU negative Renal ROS     Musculoskeletal negative musculoskeletal ROS (+)   Abdominal   Peds  Hematology negative hematology ROS (+)   Anesthesia Other Findings Day of surgery medications reviewed with the patient.  Reproductive/Obstetrics                            Anesthesia Physical Anesthesia Plan  ASA: II  Anesthesia Plan: General and Regional   Post-op Pain Management: MAC Combined w/ Regional for Post-op pain and GA combined w/ Regional for post-op pain   Induction: Intravenous  Airway Management Planned: Oral ETT  Additional Equipment:   Intra-op Plan:   Post-operative Plan: Extubation in OR  Informed Consent: I have reviewed the patients History and Physical, chart, labs and discussed the procedure including the risks, benefits and alternatives for the proposed anesthesia with the patient or authorized representative who has indicated his/her understanding and acceptance.   Dental advisory given  Plan Discussed with: CRNA  Anesthesia Plan Comments: (Risks/benefits of general anesthesia discussed with  patient including risk of damage to teeth, lips, gum, and tongue, nausea/vomiting, allergic reactions to medications, and the possibility of heart attack, stroke and death.  All patient questions answered.  Patient wishes to proceed.  Discussed risks and benefits of interscalene block including failure, bleeding, infection, nerve damage, weakness, shortness of breath, pneumothorax. Questions answered. Patient consents to block. )        Anesthesia Quick Evaluation

## 2015-07-04 NOTE — Anesthesia Procedure Notes (Addendum)
Anesthesia Regional Block:  Interscalene brachial plexus block  Pre-Anesthetic Checklist: ,, timeout performed, Correct Patient, Correct Site, Correct Laterality, Correct Procedure, Correct Position, site marked, Risks and benefits discussed,  Surgical consent,  Pre-op evaluation,  At surgeon's request and post-op pain management  Laterality: Right  Prep: chloraprep       Needles:  Injection technique: Single-shot  Needle Type: Echogenic Stimulator Needle     Needle Length: 5cm 5 cm Needle Gauge: 22 and 22 G    Additional Needles:  Procedures: ultrasound guided (picture in chart) Interscalene brachial plexus block  Nerve Stimulator or Paresthesia:  Response: biceps flexion, 0.45 mA,   Additional Responses:   Narrative:  Injection made incrementally with aspirations every 5 mL.  Performed by: Personally  Anesthesiologist: Catalina Gravel  Additional Notes: Functioning IV was confirmed and monitors were applied.  A 62mm 22ga Arrow echogenic stimulator needle was used. Sterile prep and drape,hand hygiene and sterile gloves were used.  Negative aspiration and negative test dose prior to incremental administration of local anesthetic. The patient tolerated the procedure well.  Ultrasound guidance: relevent anatomy identified, needle position confirmed, local anesthetic spread visualized around nerve(s), vascular puncture avoided.  Image printed for medical record.    Procedure Name: Intubation Date/Time: 07/04/2015 11:44 AM Performed by: Lyndee Leo Pre-anesthesia Checklist: Patient identified, Emergency Drugs available, Suction available and Patient being monitored Patient Re-evaluated:Patient Re-evaluated prior to inductionOxygen Delivery Method: Circle System Utilized Preoxygenation: Pre-oxygenation with 100% oxygen Intubation Type: IV induction Ventilation: Mask ventilation without difficulty Laryngoscope Size: Miller and 2 Grade View: Grade II Tube type:  Oral Tube size: 7.0 mm Number of attempts: 1 Airway Equipment and Method: Stylet and Oral airway Placement Confirmation: ETT inserted through vocal cords under direct vision,  positive ETCO2 and breath sounds checked- equal and bilateral Secured at: 20 cm Tube secured with: Tape Dental Injury: Teeth and Oropharynx as per pre-operative assessment

## 2015-07-04 NOTE — Discharge Instructions (Signed)
Discharge Instructions after Open Shoulder Repair  A sling has been provided for you. Remain in your sling at all times. This includes sleeping in your sling.  Use ice on the shoulder intermittently over the first 48 hours after surgery.  Pain medicine has been prescribed for you.  Use your medicine liberally over the first 48 hours, and then you can begin to taper your use. You may take Extra Strength Tylenol or Tylenol only in place of the pain pills. DO NOT take ANY nonsteroidal anti-inflammatory pain medications: Advil, Motrin, Ibuprofen, Aleve, Naproxen or Naprosyn.  You may remove your dressing after two days  You may shower 5 days after surgery. The incisions CANNOT get wet prior to 5 days. Simply allow the water to wash over the site and then pat dry. Do not rub the incisions. Make sure your axilla (armpit) is completely dry after showering.  Take one aspirin, a day for 2 weeks after surgery, unless you have an aspirin sensitivity/ allergy or asthma.   Please call [phone] during normal business hours or [phone] after hours for any problems. Including the following:  - excessive redness of the incisions - drainage for more than 4 days - fever of more than 101.5 F  *Please note that pain medications will not be refilled after hours or on weekends.   Post Anesthesia Home Care Instructions  Activity: Get plenty of rest for the remainder of the day. A responsible adult should stay with you for 24 hours following the procedure.  For the next 24 hours, DO NOT: -Drive a car -Paediatric nurse -Drink alcoholic beverages -Take any medication unless instructed by your physician -Make any legal decisions or sign important papers.  Meals: Start with liquid foods such as gelatin or soup. Progress to regular foods as tolerated. Avoid greasy, spicy, heavy foods. If nausea and/or vomiting occur, drink only clear liquids until the nausea and/or vomiting subsides. Call your physician  if vomiting continues.  Special Instructions/Symptoms: Your throat may feel dry or sore from the anesthesia or the breathing tube placed in your throat during surgery. If this causes discomfort, gargle with warm salt water. The discomfort should disappear within 24 hours.  If you had a scopolamine patch placed behind your ear for the management of post- operative nausea and/or vomiting:  1. The medication in the patch is effective for 72 hours, after which it should be removed.  Wrap patch in a tissue and discard in the trash. Wash hands thoroughly with soap and water. 2. You may remove the patch earlier than 72 hours if you experience unpleasant side effects which may include dry mouth, dizziness or visual disturbances. 3. Avoid touching the patch. Wash your hands with soap and water after contact with the patch.

## 2015-07-04 NOTE — Anesthesia Postprocedure Evaluation (Signed)
  Anesthesia Post-op Note  Patient: Elaine Price  Procedure(s) Performed: Procedure(s) (LRB): OPEN REDUCTION INTERNAL FIXATION (ORIF) CLAVICULAR FRACTURE (Right) ACROMIO-CLAVICULAR JOINT REPAIR (Right)  Patient Location: PACU  Anesthesia Type: GA combined with regional for post-op pain  Level of Consciousness: awake and alert   Airway and Oxygen Therapy: Patient Spontanous Breathing  Post-op Pain: moderate  Post-op Assessment: Post-op Vital signs reviewed, Patient's Cardiovascular Status Stable, Respiratory Function Stable, Patent Airway and No signs of Nausea or vomiting  Last Vitals:  Filed Vitals:   07/04/15 1515  BP: 178/66  Pulse: 82  Temp:   Resp: 15    Post-op Vital Signs: stable   Complications: No apparent anesthesia complications

## 2015-07-05 ENCOUNTER — Encounter (HOSPITAL_BASED_OUTPATIENT_CLINIC_OR_DEPARTMENT_OTHER): Payer: Self-pay | Admitting: Orthopedic Surgery

## 2015-07-05 DIAGNOSIS — E785 Hyperlipidemia, unspecified: Secondary | ICD-10-CM | POA: Diagnosis not present

## 2015-07-05 DIAGNOSIS — S42031A Displaced fracture of lateral end of right clavicle, initial encounter for closed fracture: Secondary | ICD-10-CM | POA: Diagnosis not present

## 2015-07-07 ENCOUNTER — Telehealth: Payer: Self-pay

## 2015-07-07 NOTE — Telephone Encounter (Signed)
Transition Care Management Follow-up Telephone Call   Date discharged? 07/05/15   How have you been since you were released from the hospital? Doing okay, I was feeling a little sick and thought I was going to throw up so I have backed off of the pain medication.  I am taking it when I feel the pain and it seems to be working.   Do you understand why you were in the hospital? Yes   Do you understand the discharge instructions? Yes and I am currently reading the section of taking in clear liquids.  It looks like if I do that it should also help with the sick feeling.    Where were you discharged to? Home   Items Reviewed:  Medications reviewed: Yes  Allergies reviewed: Yes  Dietary changes reviewed: Yes  Referrals reviewed: Yes   Functional Questionnaire:   Activities of Daily Living (ADLs):   She states they are independent in the following: All ADLs States they require assistance with the following: No assistance required at this time.   Any transportation issues/concerns?: No   Any patient concerns? Some questions regarding bandaging and medications.  Would like to have HFU with Dr. Derrel Nip.   Confirmed importance and date/time of follow-up visits scheduled Yes, appointment made with Ortho 07/15/15.  Appointment made with PCP 07/08/15.  Provider Appointment booked with Dr. Derrel Nip (PCP).  Confirmed with patient if condition begins to worsen call PCP or go to the ER.  Patient was given the office number and encouraged to call back with question or concerns.  : Yes, and patient verbalized understanding.

## 2015-07-07 NOTE — Telephone Encounter (Signed)
Unable to reach patient.  TCM call attempt.  Left message for patient to return call and confirm appointment.  Availability 07/08/15 at 1115.  Will continue to follow.

## 2015-07-08 ENCOUNTER — Encounter: Payer: Self-pay | Admitting: Internal Medicine

## 2015-07-08 ENCOUNTER — Ambulatory Visit (INDEPENDENT_AMBULATORY_CARE_PROVIDER_SITE_OTHER): Payer: Medicare Other | Admitting: Internal Medicine

## 2015-07-08 VITALS — BP 128/70 | HR 82 | Temp 97.8°F | Ht 67.0 in | Wt 148.0 lb

## 2015-07-08 DIAGNOSIS — Z87898 Personal history of other specified conditions: Secondary | ICD-10-CM

## 2015-07-08 DIAGNOSIS — E559 Vitamin D deficiency, unspecified: Secondary | ICD-10-CM | POA: Diagnosis not present

## 2015-07-08 DIAGNOSIS — Z23 Encounter for immunization: Secondary | ICD-10-CM

## 2015-07-08 DIAGNOSIS — S42021D Displaced fracture of shaft of right clavicle, subsequent encounter for fracture with routine healing: Secondary | ICD-10-CM

## 2015-07-08 DIAGNOSIS — Z8659 Personal history of other mental and behavioral disorders: Secondary | ICD-10-CM

## 2015-07-08 DIAGNOSIS — F418 Other specified anxiety disorders: Secondary | ICD-10-CM | POA: Diagnosis not present

## 2015-07-08 DIAGNOSIS — R634 Abnormal weight loss: Secondary | ICD-10-CM

## 2015-07-08 LAB — COMPREHENSIVE METABOLIC PANEL
ALBUMIN: 4.4 g/dL (ref 3.5–5.2)
ALK PHOS: 89 U/L (ref 39–117)
ALT: 19 U/L (ref 0–35)
AST: 20 U/L (ref 0–37)
BILIRUBIN TOTAL: 0.6 mg/dL (ref 0.2–1.2)
BUN: 15 mg/dL (ref 6–23)
CALCIUM: 9.8 mg/dL (ref 8.4–10.5)
CO2: 29 mEq/L (ref 19–32)
Chloride: 103 mEq/L (ref 96–112)
Creatinine, Ser: 0.62 mg/dL (ref 0.40–1.20)
GFR: 98.89 mL/min (ref >60.00)
Glucose, Bld: 67 mg/dL — ABNORMAL LOW (ref 70–99)
POTASSIUM: 4.6 meq/L (ref 3.5–5.1)
Sodium: 143 mEq/L (ref 135–145)
TOTAL PROTEIN: 7.1 g/dL (ref 6.0–8.3)

## 2015-07-08 LAB — TSH: TSH: 0.66 u[IU]/mL (ref 0.35–4.50)

## 2015-07-08 LAB — MAGNESIUM: Magnesium: 2.4 mg/dL (ref 1.5–2.5)

## 2015-07-08 LAB — VITAMIN D 25 HYDROXY (VIT D DEFICIENCY, FRACTURES): VITD: 45.65 ng/mL (ref 30.00–100.00)

## 2015-07-08 NOTE — Progress Notes (Signed)
Pre visit review using our clinic review tool, if applicable. No additional management support is needed unless otherwise documented below in the visit note. 

## 2015-07-08 NOTE — Patient Instructions (Addendum)
I am starting you on Lexapro (generic) for depression and anxiety  Start with 1/2 tablet daily  After dinner, for the first week, then move up to a full tablet.    Continue buspirone one to two times daily for anxiety  We are checking your liver enzymes today  And your Vit D level today   I would like you to return in 6 weeks
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
## 2015-07-10 DIAGNOSIS — R634 Abnormal weight loss: Secondary | ICD-10-CM | POA: Insufficient documentation

## 2015-07-10 DIAGNOSIS — Z87898 Personal history of other specified conditions: Secondary | ICD-10-CM | POA: Insufficient documentation

## 2015-07-10 LAB — PREALBUMIN: PREALBUMIN: 24 mg/dL (ref 17–34)

## 2015-07-10 NOTE — Assessment & Plan Note (Signed)
Weight loss and diet discussed .  rec's made, sSRI prescribed.
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
## 2015-07-10 NOTE — Assessment & Plan Note (Signed)
Secondary to complicated grief following the loss of her husband Elaine Price , Adding lexapro,  Continue buspirone bid,

## 2015-07-11 ENCOUNTER — Encounter: Payer: Self-pay | Admitting: Internal Medicine

## 2015-07-15 DIAGNOSIS — S42031D Displaced fracture of lateral end of right clavicle, subsequent encounter for fracture with routine healing: Secondary | ICD-10-CM | POA: Diagnosis not present

## 2015-07-18 ENCOUNTER — Ambulatory Visit (HOSPITAL_COMMUNITY): Payer: Medicare Other

## 2015-07-19 ENCOUNTER — Telehealth: Payer: Self-pay | Admitting: *Deleted

## 2015-07-19 MED ORDER — ESCITALOPRAM OXALATE 10 MG PO TABS: 10.0000 mg | ORAL_TABLET | Freq: Every day | ORAL | Status: DC

## 2015-07-19 NOTE — Telephone Encounter (Signed)
She is correct,  It was not sent.  [facility] Pleaase start with 1/2 tablet daily for the first week ,  After dinner,  Increase to full tablet after one week. Continue buspirone

## 2015-07-19 NOTE — Telephone Encounter (Signed)
Pt called states she was to believe she was to have a Rx for Lexapro filled on 9.2.16.  Review of chart no Rx sent however in the note there is mention of adding it.  Please advise

## 2015-07-20 ENCOUNTER — Other Ambulatory Visit: Payer: Self-pay | Admitting: *Deleted

## 2015-07-20 MED ORDER — ESCITALOPRAM OXALATE 10 MG PO TABS: 10.0000 mg | ORAL_TABLET | Freq: Every day | ORAL | Status: DC

## 2015-07-27 NOTE — Telephone Encounter (Signed)
Mailed unread my chart message to patient

## 2015-08-08 DIAGNOSIS — S42031D Displaced fracture of lateral end of right clavicle, subsequent encounter for fracture with routine healing: Secondary | ICD-10-CM | POA: Diagnosis not present

## 2015-08-19 ENCOUNTER — Ambulatory Visit: Payer: Medicare Other | Admitting: Internal Medicine

## 2015-08-31 ENCOUNTER — Ambulatory Visit (INDEPENDENT_AMBULATORY_CARE_PROVIDER_SITE_OTHER): Payer: Medicare Other | Admitting: Internal Medicine

## 2015-08-31 ENCOUNTER — Encounter: Payer: Self-pay | Admitting: Internal Medicine

## 2015-08-31 VITALS — BP 128/64 | HR 72 | Temp 98.0°F | Resp 12 | Ht 67.0 in | Wt 154.5 lb

## 2015-08-31 DIAGNOSIS — S42021S Displaced fracture of shaft of right clavicle, sequela: Secondary | ICD-10-CM | POA: Diagnosis not present

## 2015-08-31 DIAGNOSIS — F4329 Adjustment disorder with other symptoms: Secondary | ICD-10-CM

## 2015-08-31 DIAGNOSIS — F411 Generalized anxiety disorder: Secondary | ICD-10-CM | POA: Diagnosis not present

## 2015-08-31 DIAGNOSIS — F4321 Adjustment disorder with depressed mood: Secondary | ICD-10-CM

## 2015-08-31 DIAGNOSIS — F4381 Prolonged grief disorder: Secondary | ICD-10-CM

## 2015-08-31 MED ORDER — SERTRALINE HCL 50 MG PO TABS: 50.0000 mg | ORAL_TABLET | Freq: Every day | ORAL | Status: DC

## 2015-08-31 NOTE — Patient Instructions (Addendum)
Continue your morning buspirone dose.    Stop lexapro .,  Start zoloft 50 mg dose in the evening  Ok to increase sertraline dose to 100 mg after 2 weeks if your anxiety is not well controlled    Here are the names of several well respected female therapists  In our community that you may want to consider calling   Lennon Alstrom    [phone]  [place]   [phone] [place] [phone]  Achilles Dunk  [phone]  Altha Harm

## 2015-08-31 NOTE — Progress Notes (Signed)
Pre-visit discussion using our clinic review tool. No additional management support is needed unless otherwise documented below in the visit note.  
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
## 2015-09-02 ENCOUNTER — Ambulatory Visit: Payer: Medicare Other | Admitting: Internal Medicine

## 2015-09-03 NOTE — Assessment & Plan Note (Signed)
Patient is dealing with the expected loss of spouse and has adequate coping skills and emotional support .  i have asked patient to see a grief counsellor and  return in one month

## 2015-09-03 NOTE — Assessment & Plan Note (Signed)
Changing lexapro to sertraline .  Agree with psychology referral.  Reassured her that the weight gain is minimal that she has experienced
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
## 2015-09-14 ENCOUNTER — Other Ambulatory Visit: Payer: Self-pay | Admitting: Internal Medicine

## 2015-09-19 DIAGNOSIS — S42031D Displaced fracture of lateral end of right clavicle, subsequent encounter for fracture with routine healing: Secondary | ICD-10-CM | POA: Diagnosis not present

## 2015-10-19 ENCOUNTER — Telehealth: Payer: Self-pay | Admitting: Internal Medicine

## 2015-10-19 ENCOUNTER — Ambulatory Visit
Admission: RE | Admit: 2015-10-19 | Discharge: 2015-10-19 | Disposition: A | Discharge status: Home / Self Care | Payer: Medicare Other | Source: Ambulatory Visit | Attending: Internal Medicine | Admitting: Internal Medicine

## 2015-10-19 DIAGNOSIS — Z1231 Encounter for screening mammogram for malignant neoplasm of breast: Secondary | ICD-10-CM

## 2015-10-19 NOTE — Telephone Encounter (Signed)
Patient called the triage line, requesting to speak with Juliann Pulse.

## 2015-10-19 NOTE — Telephone Encounter (Signed)
Scheduled appointment

## 2015-10-23 ENCOUNTER — Encounter: Payer: Self-pay | Admitting: Internal Medicine

## 2015-10-24 ENCOUNTER — Encounter: Payer: Self-pay | Admitting: Internal Medicine

## 2015-10-24 ENCOUNTER — Ambulatory Visit (INDEPENDENT_AMBULATORY_CARE_PROVIDER_SITE_OTHER): Payer: Medicare Other | Admitting: Internal Medicine

## 2015-10-24 VITALS — BP 118/76 | HR 71 | Temp 97.8°F | Resp 12 | Ht 67.0 in | Wt 157.2 lb

## 2015-10-24 DIAGNOSIS — N898 Other specified noninflammatory disorders of vagina: Secondary | ICD-10-CM

## 2015-10-24 DIAGNOSIS — F4321 Adjustment disorder with depressed mood: Secondary | ICD-10-CM

## 2015-10-24 DIAGNOSIS — F4381 Prolonged grief disorder: Secondary | ICD-10-CM

## 2015-10-24 DIAGNOSIS — F4329 Adjustment disorder with other symptoms: Secondary | ICD-10-CM

## 2015-10-24 DIAGNOSIS — N9489 Other specified conditions associated with female genital organs and menstrual cycle: Secondary | ICD-10-CM

## 2015-10-24 NOTE — Patient Instructions (Signed)
You have no signs of a vaginal infection currently.  I believe the odor you are experiencing is from urine that has collected on your pubic hair .  Try using "personal hygiene wipes" after you urinate to remove any residual urine fROMm your pubic hair/area  NO MORE DOUCHES!  THEY ARE TOO HARD ON THE VAGINAL AREA

## 2015-10-24 NOTE — Progress Notes (Signed)
Pre-visit discussion using our clinic review tool. No additional management support is needed unless otherwise documented below in the visit note.  

## 2015-10-24 NOTE — Progress Notes (Signed)
Subjective:  Patient ID: Elaine Price, female    DOB: 1935/07/28  Age: 80 y.o. MRN: [id number]  CC: The primary encounter diagnosis was Vaginal odor. A diagnosis of Prolonged grief reaction was also pertinent to this visit.  HPI Elaine Price presents for follow up on Grief.  She is no longer taking any of the medications previously prescribed, including buspirone,  sertraline  and lexapro.   She is sleeping well, appetite is good,  And beginning to exercise again. She has been becoming more involved in helping other newly widowed women adjust to their altered living siutation.   2) she has been noticing a vaginal odor, present for a couple of weeks,  Has tried resolving it with douching.  Not swimming,  No burning, just an odor.     Outpatient Prescriptions Prior to Visit  Medication Sig Dispense Refill  . aspirin 81 MG tablet Take 81 mg by mouth daily.      . Calcium Carbonate-Vit D-Min (CALCIUM 1200 PO) Take 1,200 mg by mouth daily.    . Calcium Carbonate-Vit D-Min (CALCIUM 1200) 1200-1000 MG-UNIT CHEW Chew 1 tablet by mouth daily.      . busPIRone (BUSPAR) 7.5 MG tablet TAKE 1 TABLET BY MOUTH UP TO THREE TIMES DAILY (Patient not taking: Reported on 10/24/2015) 180 tablet 1  . escitalopram (LEXAPRO) 10 MG tablet TAKE 1 TABLET EVERY DAY (Patient not taking: Reported on 10/24/2015) 90 tablet 2  . sertraline (ZOLOFT) 50 MG tablet Take 1 tablet (50 mg total) by mouth daily. 90 tablet 0   No facility-administered medications prior to visit.    Review of Systems;  Patient denies headache, fevers, malaise, unintentional weight loss, skin rash, eye pain, sinus congestion and sinus pain, sore throat, dysphagia,  hemoptysis , cough, dyspnea, wheezing, chest pain, palpitations, orthopnea, edema, abdominal pain, nausea, melena, diarrhea, constipation, flank pain, dysuria, hematuria, urinary  Frequency, nocturia, numbness, tingling, seizures,  Focal weakness, Loss of consciousness,  Tremor,  insomnia, depression, anxiety, and suicidal ideation.      Objective:  BP 118/76 mmHg  Pulse 71  Temp(Src) 97.8 F (36.6 C) (Oral)  Resp 12  Ht 5\' 7"  (1.702 m)  Wt 157 lb 4 oz (71.328 kg)  BMI 24.62 kg/m2  SpO2 99%  BP Readings from Last 3 Encounters:  10/24/15 118/76  08/31/15 128/64  07/08/15 128/70    Wt Readings from Last 3 Encounters:  10/24/15 157 lb 4 oz (71.328 kg)  08/31/15 154 lb 8 oz (70.081 kg)  07/08/15 148 lb (67.132 kg)    General Appearance:    Alert, cooperative, no distress, appears stated age  Head:    Normocephalic, without obvious abnormality, atraumatic  Eyes:    PERRL, conjunctiva/corneas clear, EOM's intact, fundi    benign, both eyes  Ears:    Normal TM's and external ear canals, both ears  Nose:   Nares normal, septum midline, mucosa normal, no drainage    or sinus tenderness  Throat:   Lips, mucosa, and tongue normal; teeth and gums normal  Neck:   Supple, symmetrical, trachea midline, no adenopathy;    thyroid:  no enlargement/tenderness/nodules; no carotid   bruit or JVD  Back:     Symmetric, no curvature, ROM normal, no CVA tenderness  Lungs:     Clear to auscultation bilaterally, respirations unlabored  Chest Wall:    No tenderness or deformity   Heart:    Regular rate and rhythm, S1 and S2 normal, no murmur, rub  or gallop     Abdomen:     Soft, non-tender, bowel sounds active all four quadrants,    no masses, no organomegaly  Genitalia:    Pelvic: cervix normal in appearance, external genitalia normal,  no cervical motion tenderness,and vagina normal without discharge  Extremities:   Extremities normal, atraumatic, no cyanosis or edema  Pulses:   2+ and symmetric all extremities  Skin:   Skin color, texture, turgor normal, no rashes or lesions  Lymph nodes:   Cervical, supraclavicular, and axillary nodes normal  Neurologic:   CNII-XII intact, normal strength, sensation and reflexes    throughout     No results found for:  HGBA1C  Lab Results  Component Value Date   CREATININE 0.62 07/08/2015   CREATININE 0.6 02/04/2014   CREATININE 0.73 07/24/2013    Lab Results  Component Value Date   WBC 6.2 02/04/2014   HGB 13.2 02/04/2014   HCT 40.6 02/04/2014   PLT 266.0 02/04/2014   GLUCOSE 67* 07/08/2015   CHOL 234* 02/04/2014   TRIG 82.0 02/04/2014   HDL 69.40 02/04/2014   LDLDIRECT 149.2 01/13/2013   LDLCALC 148* 02/04/2014   ALT 19 07/08/2015   AST 20 07/08/2015   NA 143 07/08/2015   K 4.6 07/08/2015   CL 103 07/08/2015   CREATININE 0.62 07/08/2015   BUN 15 07/08/2015   CO2 29 07/08/2015   TSH 0.66 07/08/2015    Mm Screening Breast Tomo Bilateral  10/20/2015  CLINICAL DATA:  Screening. EXAM: DIGITAL SCREENING BILATERAL MAMMOGRAM WITH 3D TOMO WITH CAD COMPARISON:  Previous exam(s). ACR Breast Density Category b: There are scattered areas of fibroglandular density. FINDINGS: There are no findings suspicious for malignancy. Images were processed with CAD. IMPRESSION: No mammographic evidence of malignancy. A result letter of this screening mammogram will be mailed directly to the patient. RECOMMENDATION: Screening mammogram in one year. (Code:SM-B-01Y) BI-RADS CATEGORY  1: Negative. Electronically Signed   By: Evangeline Dakin M.D.   On: 10/20/2015 13:17    Assessment & Plan:   Problem List Items Addressed This Visit    Prolonged grief reaction    Now resolved.  No longer taking medications.       Vaginal odor - Primary    She has no signs of vaginitis.  Her odor appears to be coming from urine on her pubic hair.  Recommended use of personal hygiene  Wipes after voiding to remove urine.         A total of 25 minutes of face to face time was spent with patient more than half of which was spent in counselling about the above mentioned conditions  and coordination of care  I have discontinued Ms. Linn's busPIRone, sertraline, and escitalopram. I am also having her maintain her aspirin,  CALCIUM 1200, and Calcium Carbonate-Vit D-Min (CALCIUM 1200 PO).  No orders of the defined types were placed in this encounter.    Medications Discontinued During This Encounter  Medication Reason  . busPIRone (BUSPAR) 7.5 MG tablet Patient Preference  . escitalopram (LEXAPRO) 10 MG tablet Patient Preference  . sertraline (ZOLOFT) 50 MG tablet Patient Preference  . busPIRone (BUSPAR) 7.5 MG tablet Patient Preference  . escitalopram (LEXAPRO) 10 MG tablet Patient Preference  . busPIRone (BUSPAR) 7.5 MG tablet Patient Preference  . escitalopram (LEXAPRO) 10 MG tablet Patient Preference  . sertraline (ZOLOFT) 50 MG tablet Patient Preference    Follow-up: No Follow-up on file.   Crecencio Mc, MD

## 2015-10-26 DIAGNOSIS — N898 Other specified noninflammatory disorders of vagina: Secondary | ICD-10-CM | POA: Insufficient documentation

## 2015-10-26 NOTE — Assessment & Plan Note (Signed)
Now resolved.  No longer taking medications.

## 2015-10-26 NOTE — Assessment & Plan Note (Signed)
She has no signs of vaginitis.  Her odor appears to be coming from urine on her pubic hair.  Recommended use of personal hygiene  Wipes after voiding to remove urine.

## 2015-11-17 NOTE — Telephone Encounter (Signed)
Mailed unread message to patient.  

## 2016-04-16 DIAGNOSIS — H04123 Dry eye syndrome of bilateral lacrimal glands: Secondary | ICD-10-CM | POA: Diagnosis not present

## 2016-05-31 ENCOUNTER — Telehealth: Payer: Self-pay | Admitting: Internal Medicine

## 2016-05-31 ENCOUNTER — Ambulatory Visit (INDEPENDENT_AMBULATORY_CARE_PROVIDER_SITE_OTHER): Payer: Medicare Other

## 2016-05-31 VITALS — BP 118/72 | HR 74 | Temp 97.4°F | Resp 14 | Ht 66.5 in | Wt 153.8 lb

## 2016-05-31 DIAGNOSIS — Z23 Encounter for immunization: Secondary | ICD-10-CM | POA: Diagnosis not present

## 2016-05-31 DIAGNOSIS — Z0000 Encounter for general adult medical examination without abnormal findings: Secondary | ICD-10-CM

## 2016-05-31 DIAGNOSIS — H6123 Impacted cerumen, bilateral: Secondary | ICD-10-CM

## 2016-05-31 NOTE — Telephone Encounter (Signed)
Pt is scheduled for ear irrigation on 08/01. Can you inform Dr Derrel Nip.  Thank you!

## 2016-05-31 NOTE — Patient Instructions (Addendum)
Elaine Price , Thank you for taking time to come for your Medicare Wellness Visit. I appreciate your ongoing commitment to your health goals. Please review the following plan we discussed and let me know if I can assist you in the future.   Follow up with Dr. Derrel Nip as needed.   This is a list of the screening recommended for you and due dates:  Health Maintenance  Topic Date Due  . Pneumonia vaccines (2 of 2 - PCV13) 11/24/2015  . Flu Shot  06/05/2016  . Mammogram  10/18/2016  . Tetanus Vaccine  01/19/2025  . DEXA scan (bone density measurement)  Completed  . Shingles Vaccine  Completed   Mammogram A mammogram is an X-ray of the breasts that is done to check for abnormal changes. This procedure can screen for and detect any changes that may suggest breast cancer. A mammogram can also identify other changes and variations in the breast, such as:  Inflammation of the breast tissue (mastitis).  An infected area that contains a collection of pus (abscess).  A fluid-filled sac (cyst).  Fibrocystic changes. This is when breast tissue becomes denser, which can make the tissue feel rope-like or uneven under the skin.  Tumors that are not cancerous (benign). LET [facility] CARE PROVIDER KNOW ABOUT:  Any allergies you have.  If you have breast implants.  If you have had previous breast disease, biopsy, or surgery.  If you are breastfeeding.  Any possibility that you could be pregnant, if this applies.  If you are younger than age 75.  If you have a family history of breast cancer. RISKS AND COMPLICATIONS Generally, this is a safe procedure. However, problems may occur, including:  Exposure to radiation. Radiation levels are very low with this test.  The results being misinterpreted.  The need for further tests.  The inability of the mammogram to detect certain cancers. BEFORE THE PROCEDURE  Schedule your test about 1-2 weeks after your menstrual period. This is usually  when your breasts are the least tender.  If you have had a mammogram done at a different facility in the past, get the mammogram X-rays or have them sent to your current exam facility in order to compare them.  Wash your breasts and under your arms the day of the test.  Do not wear deodorants, perfumes, lotions, or powders anywhere on your body on the day of the test.  Remove any jewelry from your neck.  Wear clothes that you can change into and out of easily. PROCEDURE  You will undress from the waist up and put on a gown.  You will stand in front of the X-ray machine.  Each breast will be placed between two plastic or glass plates. The plates will compress your breast for a few seconds. Try to stay as relaxed as possible during the procedure. This does not cause any harm to your breasts and any discomfort you feel will be very brief.  X-rays will be taken from different angles of each breast. The procedure may vary among health care providers and hospitals. AFTER THE PROCEDURE  The mammogram will be examined by a specialist (radiologist).  You may need to repeat certain parts of the test, depending on the quality of the images. This is commonly done if the radiologist needs a better view of the breast tissue.  Ask when your test results will be ready. Make sure you get your test results.  You may resume your normal activities.  This information is not intended to replace advice given to you by your health care provider. Make sure you discuss any questions you have with your health care provider.   Document Released: 10/19/2000 Document Revised: 07/13/2015 Document Reviewed: 12/31/2014 Elsevier Interactive Patient Education [employer].

## 2016-05-31 NOTE — Telephone Encounter (Signed)
Please advise that when patient was seen by Denisa today for AWV she requested to have ears cleaned, she wanted to make you aware. thanks

## 2016-05-31 NOTE — Progress Notes (Signed)
Subjective:   Elaine Price is a 81 y.o. female who presents for Medicare Annual (Subsequent) preventive examination.  Review of Systems:  No ROS.  Medicare Wellness Visit.  Cardiac Risk Factors include: advanced age (>48men, >44 women)     Objective:     Vitals: BP 118/72 (BP Location: Right Arm, Patient Position: Sitting, Cuff Size: Normal)   Pulse 74   Temp 97.4 F (36.3 C) (Oral)   Resp 14   Ht 5' 6.5" (1.689 m)   Wt 153 lb 12.8 oz (69.8 kg)   SpO2 98%   BMI 24.45 kg/m   Body mass index is 24.45 kg/m.   Tobacco History  Smoking Status  . Never Smoker  Smokeless Tobacco  . Never Used     Counseling given: Not Answered   Past Medical History:  Diagnosis Date  . Depression   . Diverticulosis   . Gallstones   . Gastritis and duodenitis    dounf by CT Nov 2012  . Hyperlipemia   . Pneumonia    hx of   Past Surgical History:  Procedure Laterality Date  . ABDOMINAL HYSTERECTOMY    . ACROMIO-CLAVICULAR JOINT REPAIR Right 07/04/2015   Procedure: ACROMIO-CLAVICULAR JOINT REPAIR;  Surgeon: Tania Ade, MD;  Location: [facility];  Service: Orthopedics;  Laterality: Right;  Right open reduction internal fixation clavical with allograft, coracoclaviular reconstruction  . APPENDECTOMY    . CHOLECYSTECTOMY  2012  . CHOLECYSTECTOMY, LAPAROSCOPIC  08/06/2011   gallstones  . COLONOSCOPY  02/18/2006   2 mm rectal polyp, diverticulosis  . ORIF CLAVICULAR FRACTURE Right 07/04/2015   Procedure: OPEN REDUCTION INTERNAL FIXATION (ORIF) CLAVICULAR FRACTURE;  Surgeon: Tania Ade, MD;  Location: [facility];  Service: Orthopedics;  Laterality: Right;  . TONSILECTOMY, ADENOIDECTOMY, BILATERAL MYRINGOTOMY AND TUBES     as a child, does not remember date   Family History  Problem Relation Age of Onset  . Stroke Mother   . Prostate cancer Father   . Lung cancer Brother   . Prostate cancer Brother   . Colon cancer Neg Hx    History    Sexual Activity  . Sexual activity: No    Outpatient Encounter Prescriptions as of 05/31/2016  Medication Sig  . aspirin 81 MG tablet Take 81 mg by mouth daily.    . Calcium Carbonate-Vit D-Min (CALCIUM 1200 PO) Take 1,200 mg by mouth daily.  . Calcium Carbonate-Vit D-Min (CALCIUM 1200) 1200-1000 MG-UNIT CHEW Chew 1 tablet by mouth daily.     No facility-administered encounter medications on file as of 05/31/2016.     Activities of Daily Living In your present state of health, do you have any difficulty performing the following activities: 05/31/2016 07/04/2015  Hearing? N Y  Vision? N N  Difficulty concentrating or making decisions? N N  Walking or climbing stairs? N N  Dressing or bathing? N Y  Doing errands, shopping? N -  Preparing Food and eating ? N -  Using the Toilet? N -  In the past six months, have you accidently leaked urine? N -  Do you have problems with loss of bowel control? N -  Managing your Medications? N -  Managing your Finances? N -  Housekeeping or managing your Housekeeping? N -  Some recent data might be hidden    Patient Care Team: Crecencio Mc, MD as PCP - General (Internal Medicine)    Assessment:    This is a routine wellness examination for  Elaine Price. The goal of the wellness visit is to assist the patient how to close the gaps in care and create a preventative care plan for the patient.   Taking calcium VIT D as appropriate/Osteoporosis risk reviewed.  Medications reviewed; taking without issues or barriers.  Safety issues reviewed; smoke detectors in the home. No firearms in the home. Wears seatbelts when driving or riding with others. No violence in the home.  No identified risk were noted; The patient was oriented x 3; appropriate in dress and manner and no objective failures at ADL's or IADL's.   Prevnar 13 administered.  L deltoid.  Tolerated well.  Health maintenance gaps; closed.  Patient Concerns: Still wavering with  emotion from death of spouse and family issues. Outburst of tears during visit. Requests to restart Buspar 7.5mg .  Last ordered 06/2015.  See history for signature.  States she will call and schedule visit soon.  Exercise Activities and Dietary recommendations Current Exercise Habits: Home exercise routine, Type of exercise: walking (Walks the dog 2 miles per day), Time (Minutes): 30, Frequency (Times/Week): 7, Weekly Exercise (Minutes/Week): 210, Intensity: Mild  Goals    . Healthy Lifestyle          Stay hydrated and drink plenty of fluids. Maintain exercise regiment of walking the dog twice a day everyday for 30-45 minutes each. Healthy food choices.      Fall Risk Fall Risk  05/31/2016 11/23/2014  Falls in the past year? No No   Depression Screen PHQ 2/9 Scores 05/31/2016 11/23/2014  PHQ - 2 Score - 1  Exception Documentation Other- indicate reason in comment box -  Not completed Wavering emotion from death of spouse and family issues.  Followed by PCP. -     Cognitive Testing MMSE - Mini Mental State Exam 05/31/2016  Orientation to time 5  Orientation to Place 5  Registration 3  Attention/ Calculation 5  Recall 3  Language- name 2 objects 2  Language- repeat 1  Language- follow 3 step command 3  Language- read & follow direction 1  Write a sentence 1  Copy design 1  Total score 30    Immunization History  Administered Date(s) Administered  . Influenza Split 07/24/2014  . Influenza,inj,Quad PF,36+ Mos 07/08/2015  . Influenza-Unspecified 08/05/2012, 07/27/2013, 08/04/2014  . Pneumococcal Conjugate-13 05/31/2016  . Pneumococcal Polysaccharide-23 11/23/2006, 11/23/2014  . Td 01/20/2015  . Zoster 02/04/2009   Screening Tests Health Maintenance  Topic Date Due  . INFLUENZA VACCINE  06/05/2016  . MAMMOGRAM  10/18/2016  . TETANUS/TDAP  01/19/2025  . DEXA SCAN  Completed  . ZOSTAVAX  Completed  . PNA vac Low Risk Adult  Completed      Plan:   End of life  planning; Advance aging; Advanced directives discussed. Copy of current HCPOA/Living Will requested.  During the course of the visit the patient was educated and counseled about the following appropriate screening and preventive services:   Vaccines to include Pneumoccal, Influenza, Hepatitis B, Td, Zostavax, HCV  Electrocardiogram  Cardiovascular Disease  Colorectal cancer screening  Bone density screening  Diabetes screening  Glaucoma screening  Mammography/PAP  Nutrition counseling   Patient Instructions (the written plan) was given to the patient.   Varney Biles, LPN  [id number]

## 2016-06-01 ENCOUNTER — Other Ambulatory Visit: Payer: Self-pay | Admitting: Internal Medicine

## 2016-06-01 DIAGNOSIS — H6120 Impacted cerumen, unspecified ear: Secondary | ICD-10-CM | POA: Insufficient documentation

## 2016-06-01 MED ORDER — BUSPIRONE HCL 7.5 MG PO TABS: 7.5000 mg | ORAL_TABLET | Freq: Three times a day (TID) | ORAL | 0 refills | Status: DC

## 2016-06-01 NOTE — Assessment & Plan Note (Signed)
Patient will be scheduled for an RN visit for irrigation of ears

## 2016-06-03 ENCOUNTER — Telehealth: Payer: Self-pay | Admitting: Internal Medicine

## 2016-06-03 NOTE — Progress Notes (Signed)
  I have reviewed the above information and agree with above.   Sia Gabrielsen, MD 

## 2016-06-04 NOTE — Telephone Encounter (Signed)
Verbal order given by Dr. Derrel Nip to schedule RN visit for ear cleaning. Appointment scheduled 06/05/16.

## 2016-06-05 ENCOUNTER — Ambulatory Visit: Payer: Medicare Other

## 2016-06-05 DIAGNOSIS — H6123 Impacted cerumen, bilateral: Secondary | ICD-10-CM

## 2016-06-05 NOTE — Progress Notes (Addendum)
Patient was in today receiving a ear irrigation of both ears. Irrigation was successful on the left ear, but right ear was impacted and after flushing the ear for the first time it became irritated. Lavella Lemons was consulted and stated patient should try debrox. Patient was instructed to try until Friday if she was unsuccessful to call our office. Routed to Dr.Sonneberg due to PCP out of office.

## 2016-06-07 NOTE — Progress Notes (Signed)
  I have reviewed the above information and agree with above.   Yarden Manuelito, MD 

## 2016-06-11 NOTE — Progress Notes (Signed)
I have reviewed and agree as well. Reviewed as of the patient's PCP was out of the office at the time of visit.  Tommi Rumps, M.D.

## 2016-07-11 DIAGNOSIS — H6123 Impacted cerumen, bilateral: Secondary | ICD-10-CM | POA: Diagnosis not present

## 2016-07-11 DIAGNOSIS — H6062 Unspecified chronic otitis externa, left ear: Secondary | ICD-10-CM | POA: Diagnosis not present

## 2016-07-23 ENCOUNTER — Encounter: Payer: Self-pay | Admitting: Internal Medicine

## 2016-07-27 DIAGNOSIS — Z23 Encounter for immunization: Secondary | ICD-10-CM | POA: Diagnosis not present

## 2016-09-10 ENCOUNTER — Other Ambulatory Visit: Payer: Self-pay | Admitting: Internal Medicine

## 2016-09-10 DIAGNOSIS — Z1231 Encounter for screening mammogram for malignant neoplasm of breast: Secondary | ICD-10-CM

## 2016-10-19 ENCOUNTER — Ambulatory Visit (INDEPENDENT_AMBULATORY_CARE_PROVIDER_SITE_OTHER): Payer: Medicare Other | Admitting: Internal Medicine

## 2016-10-19 ENCOUNTER — Ambulatory Visit: Payer: Medicare Other

## 2016-10-19 ENCOUNTER — Encounter: Payer: Self-pay | Admitting: Internal Medicine

## 2016-10-19 VITALS — BP 128/68 | HR 94 | Temp 97.9°F | Ht 67.0 in | Wt 152.8 lb

## 2016-10-19 DIAGNOSIS — R05 Cough: Secondary | ICD-10-CM

## 2016-10-19 DIAGNOSIS — J22 Unspecified acute lower respiratory infection: Secondary | ICD-10-CM | POA: Diagnosis not present

## 2016-10-19 DIAGNOSIS — J069 Acute upper respiratory infection, unspecified: Secondary | ICD-10-CM | POA: Diagnosis not present

## 2016-10-19 DIAGNOSIS — R5383 Other fatigue: Secondary | ICD-10-CM

## 2016-10-19 DIAGNOSIS — R059 Cough, unspecified: Secondary | ICD-10-CM

## 2016-10-19 LAB — COMPREHENSIVE METABOLIC PANEL
ALBUMIN: 4.2 g/dL (ref 3.5–5.2)
ALK PHOS: 77 U/L (ref 39–117)
ALT: 17 U/L (ref 0–35)
AST: 20 U/L (ref 0–37)
BILIRUBIN TOTAL: 0.5 mg/dL (ref 0.2–1.2)
BUN: 14 mg/dL (ref 6–23)
CALCIUM: 9.1 mg/dL (ref 8.4–10.5)
CHLORIDE: 106 meq/L (ref 96–112)
CO2: 29 mEq/L (ref 19–32)
Creatinine, Ser: 0.74 mg/dL (ref 0.40–1.20)
GFR: 80.36 mL/min (ref >60.00)
Glucose, Bld: 138 mg/dL — ABNORMAL HIGH (ref 70–99)
Potassium: 4.2 mEq/L (ref 3.5–5.1)
SODIUM: 142 meq/L (ref 135–145)
TOTAL PROTEIN: 6.9 g/dL (ref 6.0–8.3)

## 2016-10-19 LAB — POCT INFLUENZA A/B
INFLUENZA B, POC: NEGATIVE
Influenza A, POC: NEGATIVE

## 2016-10-19 LAB — CBC WITH DIFFERENTIAL/PLATELET
BASOS ABS: 0 10*3/uL (ref 0.0–0.1)
Basophils Relative: 0.8 % (ref 0.0–3.0)
EOS ABS: 0 10*3/uL (ref 0.0–0.7)
Eosinophils Relative: 0.7 % (ref 0.0–5.0)
HEMATOCRIT: 41.4 % (ref 36.0–46.0)
HEMOGLOBIN: 13.7 g/dL (ref 12.0–15.0)
LYMPHS PCT: 32.1 % (ref 12.0–46.0)
Lymphs Abs: 1.9 10*3/uL (ref 0.7–4.0)
MCHC: 33.1 g/dL (ref 30.0–36.0)
MCV: 90.3 fl (ref 78.0–100.0)
MONO ABS: 0.5 10*3/uL (ref 0.1–1.0)
Monocytes Relative: 8.4 % (ref 3.0–12.0)
Neutro Abs: 3.4 10*3/uL (ref 1.4–7.7)
Neutrophils Relative %: 58 % (ref 43.0–77.0)
Platelets: 265 10*3/uL (ref 150.0–400.0)
RBC: 4.58 Mil/uL (ref 3.87–5.11)
RDW: 13.6 % (ref 11.5–15.5)
WBC: 5.9 10*3/uL (ref 4.0–10.5)

## 2016-10-19 MED ORDER — AMOXICILLIN-POT CLAVULANATE 875-125 MG PO TABS: 1.0000 | ORAL_TABLET | Freq: Two times a day (BID) | ORAL | 0 refills | Status: DC

## 2016-10-19 MED ORDER — PREDNISONE 10 MG PO TABS: ORAL_TABLET | ORAL | 0 refills | Status: DC

## 2016-10-19 NOTE — Progress Notes (Signed)
Pre visit review using our clinic review tool, if applicable. No additional management support is needed unless otherwise documented below in the visit note. 

## 2016-10-19 NOTE — Progress Notes (Signed)
Subjective:  Patient ID: Elaine Price, female    DOB: Jun 03, 1935  Age: 81 y.o. MRN: [id number]  CC: The primary encounter diagnosis was Cough. Diagnoses of Acute upper respiratory infection, Fatigue, unspecified type, and Lower respiratory infection were also pertinent to this visit.  HPI Elaine Price presents for evaluation flu like symptoms.  Symptoms Started a week ago ,  While in [place] on a cruise   she reports chills with rigors occurring in waves accomnpanied by a nonproductive cough ,  Pressure in both ears , intermittent , malaise, intermittent body aches.   No nausea.  A Travel partner also was affected.  She did not have the flu vaccine this year   Outpatient Medications Prior to Visit  Medication Sig Dispense Refill  . aspirin 81 MG tablet Take 81 mg by mouth daily.      . busPIRone (BUSPAR) 7.5 MG tablet Take 1 tablet (7.5 mg total) by mouth 3 (three) times daily. 90 tablet 0  . Calcium Carbonate-Vit D-Min (CALCIUM 1200 PO) Take 1,200 mg by mouth daily.    . Calcium Carbonate-Vit D-Min (CALCIUM 1200) 1200-1000 MG-UNIT CHEW Chew 1 tablet by mouth daily.       No facility-administered medications prior to visit.     Review of Systems;  Patient denies headache, fevers, malaise, unintentional weight loss, skin rash, eye pain, sinus congestion and sinus pain, sore throat, dysphagia,  hemoptysis , cough, dyspnea, wheezing, chest pain, palpitations, orthopnea, edema, abdominal pain, nausea, melena, diarrhea, constipation, flank pain, dysuria, hematuria, urinary  Frequency, nocturia, numbness, tingling, seizures,  Focal weakness, Loss of consciousness,  Tremor, insomnia, depression, anxiety, and suicidal ideation.      Objective:  BP 128/68   Pulse 94   Temp 97.9 F (36.6 C) (Oral)   Ht 5\' 7"  (1.702 m)   Wt 152 lb 12.8 oz (69.3 kg)   SpO2 97%   BMI 23.93 kg/m   BP Readings from Last 3 Encounters:  10/19/16 128/68  05/31/16 118/72  10/24/15 118/76    Wt  Readings from Last 3 Encounters:  10/19/16 152 lb 12.8 oz (69.3 kg)  05/31/16 153 lb 12.8 oz (69.8 kg)  10/24/15 157 lb 4 oz (71.3 kg)    General appearance: alert, cooperative and appears stated age Ears: normal TM's and external ear canals both ears Throat: lips, mucosa, and tongue normal; teeth and gums normal Neck: no adenopathy, no carotid bruit, supple, symmetrical, trachea midline and thyroid not enlarged, symmetric, no tenderness/mass/nodules Back: symmetric, no curvature. ROM normal. No CVA tenderness. Lungs: clear to auscultation bilaterally Heart: regular rate and rhythm, S1, S2 normal, no murmur, click, rub or gallop Abdomen: soft, non-tender; bowel sounds normal; no masses,  no organomegaly Pulses: 2+ and symmetric Skin: Skin color, texture, turgor normal. No rashes or lesions Lymph nodes: Cervical, supraclavicular, and axillary nodes normal.  No results found for: HGBA1C  Lab Results  Component Value Date   CREATININE 0.74 10/19/2016   CREATININE 0.62 07/08/2015   CREATININE 0.6 02/04/2014    Lab Results  Component Value Date   WBC 5.9 10/19/2016   HGB 13.7 10/19/2016   HCT 41.4 10/19/2016   PLT 265.0 10/19/2016   GLUCOSE 138 (H) 10/19/2016   CHOL 234 (H) 02/04/2014   TRIG 82.0 02/04/2014   HDL 69.40 02/04/2014   LDLDIRECT 149.2 01/13/2013   LDLCALC 148 (H) 02/04/2014   ALT 17 10/19/2016   AST 20 10/19/2016   NA 142 10/19/2016   K 4.2 10/19/2016  CL 106 10/19/2016   CREATININE 0.74 10/19/2016   BUN 14 10/19/2016   CO2 29 10/19/2016   TSH 0.66 07/08/2015    Mm Screening Breast Tomo Bilateral  Result Date: 10/20/2015 CLINICAL DATA:  Screening. EXAM: DIGITAL SCREENING BILATERAL MAMMOGRAM WITH 3D TOMO WITH CAD COMPARISON:  Previous exam(s). ACR Breast Density Category b: There are scattered areas of fibroglandular density. FINDINGS: There are no findings suspicious for malignancy. Images were processed with CAD. IMPRESSION: No mammographic evidence of  malignancy. A result letter of this screening mammogram will be mailed directly to the patient. RECOMMENDATION: Screening mammogram in one year. (Code:SM-B-01Y) BI-RADS CATEGORY  1: Negative. Electronically Signed   By: Evangeline Dakin M.D.   On: 10/20/2015 13:17    Assessment & Plan:   Problem List Items Addressed This Visit    Lower respiratory infection    RApid flu test was negative and symptoms have  Been present for a week.  [place] travel to industrialized country via [place]. Treating with augmentin and prednisone taper based on history and presentation,  Return for chest x ray if not better by Monday . Probiotic advised        Other Visit Diagnoses    Cough    -  Primary   Relevant Orders   POCT Influenza A/B (Completed)   DG Chest 2 View   CBC with Differential/Platelet (Completed)   Comprehensive metabolic panel (Completed)   Acute upper respiratory infection       Relevant Orders   DG Chest 2 View   CBC with Differential/Platelet (Completed)   Fatigue, unspecified type       Relevant Orders   CBC with Differential/Platelet (Completed)   Comprehensive metabolic panel (Completed)      I am having Ms. Bleier start on amoxicillin-clavulanate and predniSONE. I am also having her maintain her aspirin, CALCIUM 1200, Calcium Carbonate-Vit D-Min (CALCIUM 1200 PO), and busPIRone.  Meds ordered this encounter  Medications  . amoxicillin-clavulanate (AUGMENTIN) 875-125 MG tablet    Sig: Take 1 tablet by mouth 2 (two) times daily.    Dispense:  14 tablet    Refill:  0  . predniSONE (DELTASONE) 10 MG tablet    Sig: 6 tablets on Day 1 , then reduce by 1 tablet daily until gone    Dispense:  21 tablet    Refill:  0    There are no discontinued medications.  Follow-up: No Follow-up on file.   Crecencio Mc, MD

## 2016-10-19 NOTE — Patient Instructions (Addendum)
I am prescribing you a prednisone taper to start tomorrow , and a 7 day course of augmentin to start today   For your respiratory infection.    You can Use robitussin or Delsym OTC for daytime cough   Taking an antibiotic can create an imbalance in the normal population of bacteria that live in the small intestine.  This imbalance can persist for 3 months.   Taking a probiotic ( Align, Floraque or Culturelle), the generic version of one of these over the counter medications, or an alternative form ([place] Yogurt,) for a minimum of 3 weeks may help prevent a serious antibiotic associated diarrhea  Called clostridium dificile colitis that occurs when the bacteria population is altered .  Taking a probiotic may also prevent vaginitis due to yeast infections and can be continued indefinitely if you feel that it improves your digestion or your elimination (bowels).    Return for a chest x ray on Monday if not feeling better

## 2016-10-21 ENCOUNTER — Encounter: Payer: Self-pay | Admitting: Internal Medicine

## 2016-10-21 DIAGNOSIS — J22 Unspecified acute lower respiratory infection: Secondary | ICD-10-CM | POA: Insufficient documentation

## 2016-10-21 NOTE — Assessment & Plan Note (Addendum)
RApid flu test was negative and symptoms have  Been present for a week.  International travel to industrialized country via [place]. Treating with augmentin and prednisone taper based on history and presentation,  Return for chest x ray if not better by Monday . Probiotic advised

## 2016-10-24 ENCOUNTER — Ambulatory Visit (INDEPENDENT_AMBULATORY_CARE_PROVIDER_SITE_OTHER): Payer: Medicare Other

## 2016-10-24 ENCOUNTER — Ambulatory Visit: Payer: Medicare Other

## 2016-10-24 ENCOUNTER — Telehealth: Payer: Self-pay | Admitting: Internal Medicine

## 2016-10-24 ENCOUNTER — Other Ambulatory Visit (INDEPENDENT_AMBULATORY_CARE_PROVIDER_SITE_OTHER): Payer: Medicare Other

## 2016-10-24 VITALS — BP 128/58 | HR 88 | Temp 98.1°F | Resp 18

## 2016-10-24 DIAGNOSIS — R05 Cough: Secondary | ICD-10-CM | POA: Diagnosis not present

## 2016-10-24 DIAGNOSIS — R059 Cough, unspecified: Secondary | ICD-10-CM

## 2016-10-24 DIAGNOSIS — R5383 Other fatigue: Secondary | ICD-10-CM

## 2016-10-24 DIAGNOSIS — J069 Acute upper respiratory infection, unspecified: Secondary | ICD-10-CM

## 2016-10-24 DIAGNOSIS — M6281 Muscle weakness (generalized): Secondary | ICD-10-CM | POA: Diagnosis not present

## 2016-10-24 NOTE — Telephone Encounter (Addendum)
Patient states seen on 10/19/16 told to return on Monday for chest xray if no better. Patient is going to come in today for chest xray today.   She states not feeling any better wheezing, still coughing , having sweats, feeling foggy in the head. Not taking temperature.    Getting ready to finish antibiotic Amoxicillin  and taking OTC Delsym.  No appointment available please advise.

## 2016-10-24 NOTE — Telephone Encounter (Signed)
Needs an RN visit to check vital signsi ncluding temp and pulse ox.  Needs chest x ray,

## 2016-10-24 NOTE — Telephone Encounter (Signed)
Done with nurse visit.

## 2016-10-24 NOTE — Telephone Encounter (Signed)
Pt came in to see Dr. Derrel Nip on 12/15. Pt stated that she is not feeling any better, she is c/o is very weak, coughing, sweats, and foggy in the head. Please advise, thank you!  Call pt @  [phone]

## 2016-10-24 NOTE — Telephone Encounter (Deleted)
Patient is going to come over and go ahead and get chest xray .  Please advise regarding below.

## 2016-10-25 ENCOUNTER — Encounter: Payer: Self-pay | Admitting: Internal Medicine

## 2016-10-25 LAB — COMPREHENSIVE METABOLIC PANEL
ALBUMIN: 4.2 g/dL (ref 3.5–5.2)
ALK PHOS: 73 U/L (ref 39–117)
ALT: 36 U/L — ABNORMAL HIGH (ref 0–35)
AST: 22 U/L (ref 0–37)
BUN: 21 mg/dL (ref 6–23)
CALCIUM: 9.7 mg/dL (ref 8.4–10.5)
CO2: 33 mEq/L — ABNORMAL HIGH (ref 19–32)
Chloride: 101 mEq/L (ref 96–112)
Creatinine, Ser: 0.68 mg/dL (ref 0.40–1.20)
GFR: 88.59 mL/min (ref >60.00)
Glucose, Bld: 160 mg/dL — ABNORMAL HIGH (ref 70–99)
POTASSIUM: 4.5 meq/L (ref 3.5–5.1)
Sodium: 141 mEq/L (ref 135–145)
TOTAL PROTEIN: 6.7 g/dL (ref 6.0–8.3)
Total Bilirubin: 0.3 mg/dL (ref 0.2–1.2)

## 2016-10-25 LAB — CK: CK TOTAL: 29 U/L (ref 7–177)

## 2016-10-25 LAB — TSH: TSH: 1.02 u[IU]/mL (ref 0.35–4.50)

## 2016-10-25 LAB — CBC WITH DIFFERENTIAL/PLATELET
BASOS PCT: 0.7 % (ref 0.0–3.0)
Basophils Absolute: 0.1 10*3/uL (ref 0.0–0.1)
Eosinophils Absolute: 0 10*3/uL (ref 0.0–0.7)
Eosinophils Relative: 0 % (ref 0.0–5.0)
HEMATOCRIT: 40.1 % (ref 36.0–46.0)
Hemoglobin: 13.2 g/dL (ref 12.0–15.0)
LYMPHS ABS: 1.8 10*3/uL (ref 0.7–4.0)
LYMPHS PCT: 16.9 % (ref 12.0–46.0)
MCHC: 32.9 g/dL (ref 30.0–36.0)
MCV: 90.8 fl (ref 78.0–100.0)
MONOS PCT: 4 % (ref 3.0–12.0)
Monocytes Absolute: 0.4 10*3/uL (ref 0.1–1.0)
Neutro Abs: 8.6 10*3/uL — ABNORMAL HIGH (ref 1.4–7.7)
Neutrophils Relative %: 78.4 % — ABNORMAL HIGH (ref 43.0–77.0)
PLATELETS: 336 10*3/uL (ref 150.0–400.0)
RBC: 4.42 Mil/uL (ref 3.87–5.11)
RDW: 14.2 % (ref 11.5–15.5)
WBC: 10.9 10*3/uL — ABNORMAL HIGH (ref 4.0–10.5)

## 2016-10-25 LAB — C-REACTIVE PROTEIN: CRP: 0.1 mg/dL — AB (ref 0.5–20.0)

## 2016-10-25 LAB — SEDIMENTATION RATE: Sed Rate: 28 mm/hr (ref 0–30)

## 2016-10-26 NOTE — Progress Notes (Signed)
Patient comes in states she is feeling achy like she has the flu.  Still coughing a lot.  [facility] LPN, listened to her lungs and they were clear.  Please advise.

## 2016-10-29 NOTE — Progress Notes (Signed)
Patient was screened for signs of progressing illness and reassured that there was no sign of pneumonia. Supportive care outlined.

## 2016-11-06 NOTE — Telephone Encounter (Signed)
Mailed unread message to patient.  

## 2016-12-10 ENCOUNTER — Ambulatory Visit
Admission: RE | Admit: 2016-12-10 | Discharge: 2016-12-10 | Disposition: A | Discharge status: Home / Self Care | Payer: Medicare Other | Source: Ambulatory Visit | Attending: Internal Medicine | Admitting: Internal Medicine

## 2016-12-10 DIAGNOSIS — Z1231 Encounter for screening mammogram for malignant neoplasm of breast: Secondary | ICD-10-CM | POA: Diagnosis not present

## 2017-01-23 ENCOUNTER — Encounter: Payer: Self-pay | Admitting: Internal Medicine

## 2017-01-23 ENCOUNTER — Ambulatory Visit (INDEPENDENT_AMBULATORY_CARE_PROVIDER_SITE_OTHER): Payer: Medicare Other | Admitting: Internal Medicine

## 2017-01-23 VITALS — BP 146/74 | HR 94 | Temp 97.6°F | Resp 16 | Ht 67.0 in | Wt 158.8 lb

## 2017-01-23 DIAGNOSIS — R030 Elevated blood-pressure reading, without diagnosis of hypertension: Secondary | ICD-10-CM | POA: Diagnosis not present

## 2017-01-23 DIAGNOSIS — R7301 Impaired fasting glucose: Secondary | ICD-10-CM

## 2017-01-23 DIAGNOSIS — E785 Hyperlipidemia, unspecified: Secondary | ICD-10-CM | POA: Diagnosis not present

## 2017-01-23 DIAGNOSIS — H6123 Impacted cerumen, bilateral: Secondary | ICD-10-CM

## 2017-01-23 DIAGNOSIS — Z0000 Encounter for general adult medical examination without abnormal findings: Secondary | ICD-10-CM | POA: Diagnosis not present

## 2017-01-23 DIAGNOSIS — K2100 Gastro-esophageal reflux disease with esophagitis, without bleeding: Secondary | ICD-10-CM

## 2017-01-23 DIAGNOSIS — F411 Generalized anxiety disorder: Secondary | ICD-10-CM | POA: Diagnosis not present

## 2017-01-23 DIAGNOSIS — E2839 Other primary ovarian failure: Secondary | ICD-10-CM

## 2017-01-23 DIAGNOSIS — R0989 Other specified symptoms and signs involving the circulatory and respiratory systems: Secondary | ICD-10-CM | POA: Diagnosis not present

## 2017-01-23 DIAGNOSIS — E7800 Pure hypercholesterolemia, unspecified: Secondary | ICD-10-CM

## 2017-01-23 LAB — COMPREHENSIVE METABOLIC PANEL
ALBUMIN: 4.4 g/dL (ref 3.5–5.2)
ALT: 16 U/L (ref 0–35)
AST: 19 U/L (ref 0–37)
Alkaline Phosphatase: 63 U/L (ref 39–117)
BUN: 22 mg/dL (ref 6–23)
CHLORIDE: 102 meq/L (ref 96–112)
CO2: 31 mEq/L (ref 19–32)
Calcium: 10.1 mg/dL (ref 8.4–10.5)
Creatinine, Ser: 0.73 mg/dL (ref 0.40–1.20)
GFR: 81.58 mL/min (ref >60.00)
Glucose, Bld: 88 mg/dL (ref 70–99)
POTASSIUM: 4.5 meq/L (ref 3.5–5.1)
SODIUM: 139 meq/L (ref 135–145)
Total Bilirubin: 0.5 mg/dL (ref 0.2–1.2)
Total Protein: 7.1 g/dL (ref 6.0–8.3)

## 2017-01-23 LAB — HEMOGLOBIN A1C: HEMOGLOBIN A1C: 5.9 % (ref 4.6–6.5)

## 2017-01-23 LAB — LDL CHOLESTEROL, DIRECT: Direct LDL: 156 mg/dL

## 2017-01-23 MED ORDER — ESCITALOPRAM OXALATE 10 MG PO TABS: 10.0000 mg | ORAL_TABLET | Freq: Every day | ORAL | 1 refills | Status: DC

## 2017-01-23 NOTE — Progress Notes (Signed)
Pre visit review using our clinic review tool, if applicable. No additional management support is needed unless otherwise documented below in the visit note. 

## 2017-01-23 NOTE — Progress Notes (Signed)
Patient ID: Elaine Price, female    DOB: December 14, 1934  Age: 82 y.o. MRN: [id number]  The patient is here for follow up and management of other chronic and acute problems.   COLONOSCOPY 2007  [facility] FEB 2018 CLAVICLE FRACTURE DURING VACATION IN [place] OCT 2016.  HAD TO HAVE SURGERY WITH ORIF   NO DEXA SINCE   HAD AN URI  IN December THAT WAS TREATED with AUGMENTIN AND PREDNISONE WITH PROBIOTIC   walks 2 miles daily with her schnoodle dog Mallie Mussel  .  The risk factors are reflected in the social history.  The roster of all physicians providing medical care to patient - is listed in the Snapshot section of the chart.  Activities of daily living:  The patient is 100% independent in all ADLs: dressing, toileting, feeding as well as independent mobility  Home safety : The patient has smoke detectors in the home. They wear seatbelts.  There are no firearms at home. There is no violence in the home.   There is no risks for hepatitis, STDs or HIV. There is no   history of blood transfusion. They have no travel history to infectious disease endemic areas of the world.  The patient has seen their dentist in the last six month. They have seen their eye doctor in the last year. They admit to slight hearing difficulty with regard to whispered voices and some television programs.  They have deferred audiologic testing in the last year.  They do not  have excessive sun exposure. Discussed the need for sun protection: hats, long sleeves and use of sunscreen if there is significant sun exposure.   Diet: the importance of a healthy diet is discussed. They do have a healthy diet.  The benefits of regular aerobic exercise were discussed. She walks 4 times per week ,  20 minutes.   Depression screen: there are no signs or vegative symptoms of depression- irritability, change in appetite, anhedonia, sadness/tearfullness.  Cognitive assessment: the patient manages all their financial and personal  affairs and is actively engaged. They could relate day,date,year and events; recalled 2/3 objects at 3 minutes; performed clock-face test normally.  The following portions of the patient's history were reviewed and updated as appropriate: allergies, current medications, past family history, past medical history,  past surgical history, past social history  and problem list.  Visual acuity was not assessed per patient preference since she has regular follow up with her ophthalmologist. Hearing and body mass index were assessed and reviewed.   During the course of the visit the patient was educated and counseled about appropriate screening and preventive services including : fall prevention , diabetes screening, nutrition counseling, colorectal cancer screening, and recommended immunizations.    History Crissa has a past medical history of Depression; Diverticulosis; Gallstones; Gastritis and duodenitis; Hyperlipemia; and Pneumonia.   She has a past surgical history that includes Abdominal hysterectomy; Tonsilectomy, adenoidectomy, bilateral myringotomy and tubes; Appendectomy; Cholecystectomy, laparoscopic (08/06/2011); Colonoscopy (02/18/2006); Cholecystectomy (2012); ORIF clavicular fracture (Right, 07/04/2015); and Acromio-clavicular joint repair (Right, 07/04/2015).   Her family history includes Lung cancer in her brother; Prostate cancer in her brother and father; Stroke in her mother.She reports that she has never smoked. She has never used smokeless tobacco. She reports that she does not drink alcohol or use drugs.  Outpatient Medications Prior to Visit  Medication Sig Dispense Refill  . aspirin 81 MG tablet Take 81 mg by mouth daily.      . busPIRone (BUSPAR) 7.5  MG tablet Take 1 tablet (7.5 mg total) by mouth 3 (three) times daily. 90 tablet 0  . Calcium Carbonate-Vit D-Min (CALCIUM 1200 PO) Take 1,200 mg by mouth daily.    Marland Kitchen amoxicillin-clavulanate (AUGMENTIN) 875-125 MG tablet Take 1  tablet by mouth 2 (two) times daily. (Patient not taking: Reported on 01/23/2017) 14 tablet 0  . Calcium Carbonate-Vit D-Min (CALCIUM 1200) 1200-1000 MG-UNIT CHEW Chew 1 tablet by mouth daily.      . predniSONE (DELTASONE) 10 MG tablet 6 tablets on Day 1 , then reduce by 1 tablet daily until gone (Patient not taking: Reported on 01/23/2017) 21 tablet 0   No facility-administered medications prior to visit.     Review of Systems   Patient denies headache, fevers, malaise, unintentional weight loss, skin rash, eye pain, sinus congestion and sinus pain, sore throat, dysphagia,  hemoptysis , cough, dyspnea, wheezing, chest pain, palpitations, orthopnea, edema, abdominal pain, nausea, melena, diarrhea, constipation, flank pain, dysuria, hematuria, urinary  Frequency, nocturia, numbness, tingling, seizures,  Focal weakness, Loss of consciousness,  Tremor, insomnia, depression, anxiety, and suicidal ideation.     Objective:  BP (!) 146/74 (BP Location: Left Arm, Patient Position: Sitting, Cuff Size: Normal)   Pulse 94   Temp 97.6 F (36.4 C) (Oral)   Resp 16   Ht 5\' 7"  (1.702 m)   Wt 158 lb 12.8 oz (72 kg)   SpO2 99%   BMI 24.87 kg/m   Physical Exam   General appearance: alert, cooperative and appears stated age Ears: BILATERAL CERUMEN IMPACTION  Throat: lips, mucosa, and tongue normal; teeth and gums normal Neck: no adenopathy, no carotid bruit, supple, symmetrical, trachea midline and thyroid not enlarged, symmetric, no tenderness/mass/nodules Back: symmetric, no curvature. ROM normal. No CVA tenderness. Lungs: clear to auscultation bilaterally Heart: regular rate and rhythm, S1, S2 normal, no murmur, click, rub or gallop Abdomen: soft, non-tender; bowel sounds normal; no masses,  no organomegaly Pulses: 2+ and symmetric Skin: Skin color, texture, turgor normal. No rashes or lesions Lymph nodes: Cervical, supraclavicular, and axillary nodes normal.    Assessment & Plan:   Problem  List Items Addressed This Visit    Bilateral carotid bruits    Referral for carotid artery dopplers.       Relevant Orders   VAS US CAROTID   Cerumen impaction    SHE IS IMPACTED AGAIN AND WILL RETURN FOR EAR CLEANING       Elevated blood-pressure reading without diagnosis of hypertension    He has no prior history of hypertension. He will check his blood pressure several times over the next 3-4 weeks and to submit readings for evaluation  Lab Results  Component Value Date   CREATININE 0.73 01/23/2017   Lab Results  Component Value Date   NA 139 01/23/2017   K 4.5 01/23/2017   CL 102 01/23/2017   CO2 31 01/23/2017         Esophagitis, reflux    She has had an EGD to rule out Barrett's esophagus in 2012.  She has stopped taking omeprazole and has been drinking aloe vera juice 2 ounces daily       Generalized anxiety disorder    Recommend trial of lexapro for management of anxiety      Hyperlipidemia LDL goal <100    She has mild untreated hyperlipidemia.  If her carotids are positive for placque,  Will recommend statin therapy.   Lab Results  Component Value Date   CHOL 234 (H)  02/04/2014   HDL 69.40 02/04/2014   LDLCALC 148 (H) 02/04/2014   LDLDIRECT 156.0 01/23/2017   TRIG 82.0 02/04/2014   CHOLHDL 3 02/04/2014         Routine general medical examination at a health care facility    Annual comprehensive preventive exam was done as well as an evaluation and management of chronic conditions .  During the course of the visit the patient was educated and counseled about appropriate screening and preventive services including :  diabetes screening, lipid analysis with projected  10 year  risk for CAD , nutrition counseling, breast, cervical and colorectal cancer screening, and recommended immunizations.  Printed recommendations for health maintenance screenings was give       Other Visit Diagnoses    Estrogen deficiency    -  Primary   Relevant Orders   DG Bone  Density   Pure hypercholesterolemia       Relevant Orders   LDL cholesterol, direct (Completed)   Impaired fasting glucose       Relevant Orders   Comprehensive metabolic panel (Completed)   Hemoglobin A1c (Completed)      I have discontinued Ms. Ohanian's CALCIUM 1200, amoxicillin-clavulanate, and predniSONE. I am also having her start on escitalopram. Additionally, I am having her maintain her aspirin, Calcium Carbonate-Vit D-Min (CALCIUM 1200 PO), and busPIRone.  Meds ordered this encounter  Medications  . escitalopram (LEXAPRO) 10 MG tablet    Sig: Take 1 tablet (10 mg total) by mouth daily.    Dispense:  30 tablet    Refill:  1    Medications Discontinued During This Encounter  Medication Reason  . amoxicillin-clavulanate (AUGMENTIN) 875-125 MG tablet Therapy completed  . Calcium Carbonate-Vit D-Min (CALCIUM 1200) 1200-1000 MG-UNIT CHEW Duplicate  . predniSONE (DELTASONE) 10 MG tablet Therapy completed    Follow-up: Return for RN VISIT FOR ERA IRRIGAITON BILATERAL .   Crecencio Mc, MD

## 2017-01-23 NOTE — Assessment & Plan Note (Signed)
SHE IS IMPACTED AGAIN AND WILL RETURN FOR EAR CLEANING

## 2017-01-23 NOTE — Patient Instructions (Addendum)
The new goals for optimal blood pressure management are 120/70.  Please check your blood pressure a few times at home and send me the readings so I can determine if you need a change in medication    Please start the Lexapro (escitalopram) at 1/2 tablet daily in the evening for the first few days to avoid nausea.  You can increase to a full tablet after 4 days if you havenot developed side effects of nausea.  If the lexapro interferes with your sleep, take it in the morning instead  I have ordered your DEXA scan, along with carotid ultrasounds    You need 1200 to 1500 mg of calcium daily .  I recommend getting the majority of your calcium and Vitamin D  through diet rather than supplements given the recent association of calcium supplements with increased coronary artery calcium scores.   Try the almond and cashew milks that most grocery stores  now carry  in the dairy  Section>   They are lactose free.   Health Maintenance for Postmenopausal Women Menopause is a normal process in which your reproductive ability comes to an end. This process happens gradually over a span of months to years, usually between the ages of 56 and 37. Menopause is complete when you have missed 12 consecutive menstrual periods. It is important to talk with your health care provider about some of the most common conditions that affect postmenopausal women, such as heart disease, cancer, and bone loss (osteoporosis). Adopting a healthy lifestyle and getting preventive care can help to promote your health and wellness. Those actions can also lower your chances of developing some of these common conditions. What should I know about menopause? During menopause, you may experience a number of symptoms, such as:  Moderate-to-severe hot flashes.  Night sweats.  Decrease in sex drive.  Mood swings.  Headaches.  Tiredness.  Irritability.  Memory problems.  Insomnia. Choosing to treat or not to treat menopausal  changes is an individual decision that you make with your health care provider. What should I know about hormone replacement therapy and supplements? Hormone therapy products are effective for treating symptoms that are associated with menopause, such as hot flashes and night sweats. Hormone replacement carries certain risks, especially as you become older. If you are thinking about using estrogen or estrogen with progestin treatments, discuss the benefits and risks with your health care provider. What should I know about heart disease and stroke? Heart disease, heart attack, and stroke become more likely as you age. This may be due, in part, to the hormonal changes that your body experiences during menopause. These can affect how your body processes dietary fats, triglycerides, and cholesterol. Heart attack and stroke are both medical emergencies. There are many things that you can do to help prevent heart disease and stroke:  Have your blood pressure checked at least every 1-2 years. High blood pressure causes heart disease and increases the risk of stroke.  If you are 48-33 years old, ask your health care provider if you should take aspirin to prevent a heart attack or a stroke.  Do not use any tobacco products, including cigarettes, chewing tobacco, or electronic cigarettes. If you need help quitting, ask your health care provider.  It is important to eat a healthy diet and maintain a healthy weight.  Be sure to include plenty of vegetables, fruits, low-fat dairy products, and lean protein.  Avoid eating foods that are high in solid fats, added sugars, or salt (  sodium).  Get regular exercise. This is one of the most important things that you can do for your health.  Try to exercise for at least 150 minutes each week. The type of exercise that you do should increase your heart rate and make you sweat. This is known as moderate-intensity exercise.  Try to do strengthening exercises at least  twice each week. Do these in addition to the moderate-intensity exercise.  Know your numbers.Ask your health care provider to check your cholesterol and your blood glucose. Continue to have your blood tested as directed by your health care provider. What should I know about cancer screening? There are several types of cancer. Take the following steps to reduce your risk and to catch any cancer development as early as possible. Breast Cancer  Practice breast self-awareness.  This means understanding how your breasts normally appear and feel.  It also means doing regular breast self-exams. Let your health care provider know about any changes, no matter how small.  If you are 11 or older, have a clinician do a breast exam (clinical breast exam or CBE) every year. Depending on your age, family history, and medical history, it may be recommended that you also have a yearly breast X-ray (mammogram).  If you have a family history of breast cancer, talk with your health care provider about genetic screening.  If you are at high risk for breast cancer, talk with your health care provider about having an MRI and a mammogram every year.  Breast cancer (BRCA) gene test is recommended for women who have family members with BRCA-related cancers. Results of the assessment will determine the need for genetic counseling and BRCA1 and for BRCA2 testing. BRCA-related cancers include these types:  Breast. This occurs in males or females.  Ovarian.  Tubal. This may also be called fallopian tube cancer.  Cancer of the abdominal or pelvic lining (peritoneal cancer).  Prostate.  Pancreatic. Cervical, Uterine, and Ovarian Cancer  Your health care provider may recommend that you be screened regularly for cancer of the pelvic organs. These include your ovaries, uterus, and vagina. This screening involves a pelvic exam, which includes checking for microscopic changes to the surface of your cervix (Pap  test).  For women ages 21-65, health care providers may recommend a pelvic exam and a Pap test every three years. For women ages 79-65, they may recommend the Pap test and pelvic exam, combined with testing for human papilloma virus (HPV), every five years. Some types of HPV increase your risk of cervical cancer. Testing for HPV may also be done on women of any age who have unclear Pap test results.  Other health care providers may not recommend any screening for nonpregnant women who are considered low risk for pelvic cancer and have no symptoms. Ask your health care provider if a screening pelvic exam is right for you.  If you have had past treatment for cervical cancer or a condition that could lead to cancer, you need Pap tests and screening for cancer for at least 20 years after your treatment. If Pap tests have been discontinued for you, your risk factors (such as having a new sexual partner) need to be reassessed to determine if you should start having screenings again. Some women have medical problems that increase the chance of getting cervical cancer. In these cases, your health care provider may recommend that you have screening and Pap tests more often.  If you have a family history of uterine cancer or  ovarian cancer, talk with your health care provider about genetic screening.  If you have vaginal bleeding after reaching menopause, tell your health care provider.  There are currently no reliable tests available to screen for ovarian cancer. Lung Cancer  Lung cancer screening is recommended for adults 6-37 years old who are at high risk for lung cancer because of a history of smoking. A yearly low-dose CT scan of the lungs is recommended if you:  Currently smoke.  Have a history of at least 30 pack-years of smoking and you currently smoke or have quit within the past 15 years. A pack-year is smoking an average of one pack of cigarettes per day for one year. Yearly screening  should:  Continue until it has been 15 years since you quit.  Stop if you develop a health problem that would prevent you from having lung cancer treatment. Colorectal Cancer  This type of cancer can be detected and can often be prevented.  Routine colorectal cancer screening usually begins at age 47 and continues through age 62.  If you have risk factors for colon cancer, your health care provider may recommend that you be screened at an earlier age.  If you have a family history of colorectal cancer, talk with your health care provider about genetic screening.  Your health care provider may also recommend using home test kits to check for hidden blood in your stool.  A small camera at the end of a tube can be used to examine your colon directly (sigmoidoscopy or colonoscopy). This is done to check for the earliest forms of colorectal cancer.  Direct examination of the colon should be repeated every 5-10 years until age 46. However, if early forms of precancerous polyps or small growths are found or if you have a family history or genetic risk for colorectal cancer, you may need to be screened more often. Skin Cancer  Check your skin from head to toe regularly.  Monitor any moles. Be sure to tell your health care provider:  About any new moles or changes in moles, especially if there is a change in a mole's shape or color.  If you have a mole that is larger than the size of a pencil eraser.  If any of your family members has a history of skin cancer, especially at a young age, talk with your health care provider about genetic screening.  Always use sunscreen. Apply sunscreen liberally and repeatedly throughout the day.  Whenever you are outside, protect yourself by wearing long sleeves, pants, a wide-brimmed hat, and sunglasses. What should I know about osteoporosis? Osteoporosis is a condition in which bone destruction happens more quickly than new bone creation. After  menopause, you may be at an increased risk for osteoporosis. To help prevent osteoporosis or the bone fractures that can happen because of osteoporosis, the following is recommended:  If you are 70-68 years old, get at least 1,000 mg of calcium and at least 600 mg of vitamin D per day.  If you are older than age 43 but younger than age 50, get at least 1,200 mg of calcium and at least 600 mg of vitamin D per day.  If you are older than age 6, get at least 1,200 mg of calcium and at least 800 mg of vitamin D per day. Smoking and excessive alcohol intake increase the risk of osteoporosis. Eat foods that are rich in calcium and vitamin D, and do weight-bearing exercises several times each week as directed  by your health care provider. What should I know about how menopause affects my mental health? Depression may occur at any age, but it is more common as you become older. Common symptoms of depression include:  Low or sad mood.  Changes in sleep patterns.  Changes in appetite or eating patterns.  Feeling an overall lack of motivation or enjoyment of activities that you previously enjoyed.  Frequent crying spells. Talk with your health care provider if you think that you are experiencing depression. What should I know about immunizations? It is important that you get and maintain your immunizations. These include:  Tetanus, diphtheria, and pertussis (Tdap) booster vaccine.  Influenza every year before the flu season begins.  Pneumonia vaccine.  Shingles vaccine. Your health care provider may also recommend other immunizations. This information is not intended to replace advice given to you by your health care provider. Make sure you discuss any questions you have with your health care provider. Document Released: 12/14/2005 Document Revised: 05/11/2016 Document Reviewed: 07/26/2015 Elsevier Interactive Patient Education  2017 [employer].

## 2017-01-25 DIAGNOSIS — E785 Hyperlipidemia, unspecified: Secondary | ICD-10-CM | POA: Insufficient documentation

## 2017-01-25 DIAGNOSIS — I739 Peripheral vascular disease, unspecified: Secondary | ICD-10-CM | POA: Insufficient documentation

## 2017-01-25 NOTE — Assessment & Plan Note (Signed)
She has had an EGD to rule out Barrett's esophagus in 2012.  She has stopped taking omeprazole and has been drinking aloe vera juice 2 ounces daily

## 2017-01-25 NOTE — Assessment & Plan Note (Signed)
Recommend trial of lexapro for management of anxiety

## 2017-01-25 NOTE — Assessment & Plan Note (Signed)
He has no prior history of hypertension. He will check his blood pressure several times over the next 3-4 weeks and to submit readings for evaluation  Lab Results  Component Value Date   CREATININE 0.73 01/23/2017   Lab Results  Component Value Date   NA 139 01/23/2017   K 4.5 01/23/2017   CL 102 01/23/2017   CO2 31 01/23/2017

## 2017-01-25 NOTE — Assessment & Plan Note (Signed)
Referral for carotid artery dopplers.

## 2017-01-25 NOTE — Assessment & Plan Note (Signed)
Annual comprehensive preventive exam was done as well as an evaluation and management of chronic conditions .  During the course of the visit the patient was educated and counseled about appropriate screening and preventive services including :  diabetes screening, lipid analysis with projected  10 year  risk for CAD , nutrition counseling, breast, cervical and colorectal cancer screening, and recommended immunizations.  Printed recommendations for health maintenance screenings was give 

## 2017-01-25 NOTE — Assessment & Plan Note (Signed)
She has mild untreated hyperlipidemia.  If her carotids are positive for placque,  Will recommend statin therapy.   Lab Results  Component Value Date   CHOL 234 (H) 02/04/2014   HDL 69.40 02/04/2014   LDLCALC 148 (H) 02/04/2014   LDLDIRECT 156.0 01/23/2017   TRIG 82.0 02/04/2014   CHOLHDL 3 02/04/2014

## 2017-01-27 ENCOUNTER — Encounter: Payer: Self-pay | Admitting: Internal Medicine

## 2017-01-31 ENCOUNTER — Other Ambulatory Visit: Payer: Self-pay | Admitting: Internal Medicine

## 2017-02-04 NOTE — Telephone Encounter (Signed)
Patient needs lab appointment and nurse visit for ear irrigation please schedule.

## 2017-02-04 NOTE — Telephone Encounter (Signed)
Called and lm on vm to call office and schedule a lab and nurse visit for ear irrigation.

## 2017-02-05 ENCOUNTER — Telehealth: Payer: Self-pay | Admitting: Internal Medicine

## 2017-02-05 DIAGNOSIS — E785 Hyperlipidemia, unspecified: Secondary | ICD-10-CM

## 2017-02-05 NOTE — Telephone Encounter (Signed)
Pt needs order for fasting labs per Dr Derrel Nip. Please and thank you!

## 2017-02-05 NOTE — Telephone Encounter (Signed)
When does she need to have these done? I don't see an appt for labs. Please advise.

## 2017-02-05 NOTE — Telephone Encounter (Signed)
Spoke with pt and she is scheduled for Friday the 6th at 10:30am for fasting lab work.

## 2017-02-05 NOTE — Telephone Encounter (Signed)
Fasting lipid ordered for whenever she can return

## 2017-02-08 ENCOUNTER — Other Ambulatory Visit (INDEPENDENT_AMBULATORY_CARE_PROVIDER_SITE_OTHER): Payer: Medicare Other

## 2017-02-08 DIAGNOSIS — E785 Hyperlipidemia, unspecified: Secondary | ICD-10-CM

## 2017-02-08 LAB — LIPID PANEL
CHOLESTEROL: 250 mg/dL — AB (ref 0–200)
HDL: 86.8 mg/dL (ref >39.00)
LDL Cholesterol: 147 mg/dL — ABNORMAL HIGH (ref 0–99)
NonHDL: 163.02
TRIGLYCERIDES: 78 mg/dL (ref 0.0–149.0)
Total CHOL/HDL Ratio: 3
VLDL: 15.6 mg/dL (ref 0.0–40.0)

## 2017-02-10 ENCOUNTER — Encounter: Payer: Self-pay | Admitting: Internal Medicine

## 2017-02-12 ENCOUNTER — Ambulatory Visit (INDEPENDENT_AMBULATORY_CARE_PROVIDER_SITE_OTHER): Payer: Medicare Other | Admitting: *Deleted

## 2017-02-12 DIAGNOSIS — H6123 Impacted cerumen, bilateral: Secondary | ICD-10-CM | POA: Diagnosis not present

## 2017-02-12 NOTE — Progress Notes (Addendum)
Patient presented for bilateral ear irrigation order at visit on 01/23/17,,Patient right and left ear was irrigated for approximately 8 - 10 minutes each ear the right ear had a generous amount of cerumen size of a pencil eraser. The left ear only a smaller amount was flushed no cerumen was noted in either canal after irrigation. Patient voiced no concern or distress during irrigation. Nurse constantly ask patient idf he felt discomfort patient denied any.  Reviewed.  Dr Nicki Reaper

## 2017-02-17 ENCOUNTER — Telehealth: Payer: Self-pay | Admitting: Internal Medicine

## 2017-02-17 MED ORDER — AMLODIPINE BESYLATE 2.5 MG PO TABS: 2.5000 mg | ORAL_TABLET | Freq: Every day | ORAL | 0 refills | Status: DC

## 2017-02-17 NOTE — Telephone Encounter (Signed)
Home blood pressure readings confirm hypertension. I would like her to consider starting amlodipine 2.5 mg daily .  Recheck bp after one week .  Med sent to pharmacy

## 2017-02-18 NOTE — Telephone Encounter (Addendum)
Patient advised of below and verbalized understanding   Appointment scheduled for nurse blood pressure check

## 2017-02-25 NOTE — Telephone Encounter (Signed)
Mailed unread message to patient.  

## 2017-02-26 ENCOUNTER — Ambulatory Visit (INDEPENDENT_AMBULATORY_CARE_PROVIDER_SITE_OTHER): Payer: Medicare Other | Admitting: *Deleted

## 2017-02-26 ENCOUNTER — Encounter: Payer: Self-pay | Admitting: *Deleted

## 2017-02-26 VITALS — BP 138/60 | HR 67 | Resp 14

## 2017-02-26 DIAGNOSIS — R030 Elevated blood-pressure reading, without diagnosis of hypertension: Secondary | ICD-10-CM

## 2017-02-26 NOTE — Progress Notes (Addendum)
Patient present for BP check one week post starting Amlodipine 2.5 mg daily, Patient BP was attained according to nursing standards. Patient BP in left arm 128/60 pulse 64, after waiting an additional 5 -8 minutes attained BP in Right arm BP 138/60 pulse 67.  Reviewed.  Blood pressure doing well.   Dr Nicki Reaper

## 2017-02-28 ENCOUNTER — Ambulatory Visit: Payer: Medicare Other

## 2017-02-28 DIAGNOSIS — R0989 Other specified symptoms and signs involving the circulatory and respiratory systems: Secondary | ICD-10-CM

## 2017-03-01 LAB — VAS US CAROTID
LEFT ECA DIAS: -2 cm/s
LEFT VERTEBRAL DIAS: 14 cm/s
Left CCA dist dias: 22 cm/s
Left CCA dist sys: 286 cm/s
Left CCA prox dias: 14 cm/s
Left CCA prox sys: 113 cm/s
Left ICA dist dias: -68 cm/s
Left ICA dist sys: -248 cm/s
Left ICA prox dias: -27 cm/s
Left ICA prox sys: -128 cm/s
RCCADSYS: -125 cm/s
RCCAPDIAS: 3 cm/s
RCCAPSYS: 82 cm/s
RIGHT ECA DIAS: 0 cm/s
RIGHT VERTEBRAL DIAS: 14 cm/s

## 2017-03-04 ENCOUNTER — Telehealth: Payer: Self-pay | Admitting: Internal Medicine

## 2017-03-04 NOTE — Telephone Encounter (Signed)
Pt returning office phone call to Elaine Price. Please call her back.

## 2017-03-04 NOTE — Telephone Encounter (Signed)
See result note message 

## 2017-03-11 ENCOUNTER — Other Ambulatory Visit: Payer: Self-pay | Admitting: Internal Medicine

## 2017-03-11 NOTE — Telephone Encounter (Signed)
Pt called back stating do not send in the Rx at this time. Thank you!

## 2017-03-11 NOTE — Telephone Encounter (Signed)
Per Patient request, not sent. thanks

## 2017-03-11 NOTE — Telephone Encounter (Signed)
Pt called and is requesting a 3 month supply on this medication. Please advise, thank you!

## 2017-03-13 ENCOUNTER — Other Ambulatory Visit: Payer: Self-pay | Admitting: Internal Medicine

## 2017-03-13 ENCOUNTER — Ambulatory Visit
Admission: RE | Admit: 2017-03-13 | Discharge: 2017-03-13 | Disposition: A | Discharge status: Home / Self Care | Payer: Medicare Other | Source: Ambulatory Visit | Attending: Internal Medicine | Admitting: Internal Medicine

## 2017-03-13 DIAGNOSIS — M85851 Other specified disorders of bone density and structure, right thigh: Secondary | ICD-10-CM | POA: Diagnosis not present

## 2017-03-13 DIAGNOSIS — M810 Age-related osteoporosis without current pathological fracture: Secondary | ICD-10-CM | POA: Insufficient documentation

## 2017-03-13 DIAGNOSIS — E2839 Other primary ovarian failure: Secondary | ICD-10-CM | POA: Diagnosis not present

## 2017-03-13 DIAGNOSIS — Z780 Asymptomatic menopausal state: Secondary | ICD-10-CM | POA: Diagnosis not present

## 2017-03-14 ENCOUNTER — Encounter: Payer: Self-pay | Admitting: Internal Medicine

## 2017-03-14 DIAGNOSIS — M810 Age-related osteoporosis without current pathological fracture: Secondary | ICD-10-CM | POA: Insufficient documentation

## 2017-03-14 MED ORDER — AMLODIPINE BESYLATE 2.5 MG PO TABS: 2.5000 mg | ORAL_TABLET | Freq: Every day | ORAL | 0 refills | Status: DC

## 2017-03-20 ENCOUNTER — Telehealth: Payer: Self-pay | Admitting: Internal Medicine

## 2017-03-20 DIAGNOSIS — R2689 Other abnormalities of gait and mobility: Secondary | ICD-10-CM

## 2017-03-20 NOTE — Telephone Encounter (Signed)
Please advise 

## 2017-03-20 NOTE — Telephone Encounter (Signed)
Pt called and is requesting a referral for Physical for balance. She would like to [facility] and sent to [facility] fax [phone]. Please advise, thank you!  Call pt @ [phone]

## 2017-03-20 NOTE — Telephone Encounter (Signed)
Referral in process to outside PT per patient request

## 2017-03-21 NOTE — Telephone Encounter (Signed)
Please advise 

## 2017-03-21 NOTE — Telephone Encounter (Signed)
Patient advised an verbalized understanding

## 2017-03-22 ENCOUNTER — Encounter: Payer: Self-pay | Admitting: Internal Medicine

## 2017-03-28 DIAGNOSIS — R2681 Unsteadiness on feet: Secondary | ICD-10-CM | POA: Diagnosis not present

## 2017-04-03 DIAGNOSIS — R2681 Unsteadiness on feet: Secondary | ICD-10-CM | POA: Diagnosis not present

## 2017-04-04 DIAGNOSIS — R2681 Unsteadiness on feet: Secondary | ICD-10-CM | POA: Diagnosis not present

## 2017-04-08 ENCOUNTER — Telehealth: Payer: Self-pay | Admitting: *Deleted

## 2017-04-08 DIAGNOSIS — R2681 Unsteadiness on feet: Secondary | ICD-10-CM | POA: Diagnosis not present

## 2017-04-08 NOTE — Telephone Encounter (Signed)
Patient advised per conversation with PCP to increase amlodipine to 10 mg and scheduled patient for Thursday at 9.

## 2017-04-08 NOTE — Telephone Encounter (Signed)
Patient Name: Elaine Price DOB: Aug 16, 1935 Initial Comment Caller states, Janett Billow from the office- patient is having dizziness and think its her blood pressure- 159/ n/a - on a new rx. Verified Nurse Assessment Nurse: Zorita Pang, RN, Deborah Date/Time (Eastern Time): 04/08/2017 12:55:14 PM Confirm and document reason for call. If symptomatic, describe symptoms. ---The caller states that she was started on a new medication for her BP - Amlodipine 2.5 mg daily and she has been taking it regularly. She states that she sees a Photographer and she told her that she told her that she felt a little dizzy and she checked her BP and it was 148 over something that she can't remember. She just took her BP and it was 159/115. She denies any pain and no headache. She states that she does take her medication in the morning. Does the patient have any new or worsening symptoms? ---Yes Will a triage be completed? ---Yes Related visit to physician within the last 2 weeks? ---No Does the PT have any chronic conditions? (i.e. diabetes, asthma, etc.) ---Yes List chronic conditions. ---hypertension, osteoporosis Is this a behavioral health or substance abuse call? ---No Guidelines Guideline Title Affirmed Question Affirmed Notes High Blood Pressure Systolic BP >= 098 OR Diastolic >= 119 Final Disposition User See PCP When Office is Open (within 3 days) Womble, RN, Neoma Laming Comments The caller states that she wants to talk to a nurse at her doctors office and this nurse told her that I am capable of helping her. She states that she leaves in a place with a RN and she will go see the RN. She states that she was recently started on BP medication and she states that her BP was 159/115 and that her head felt full. This nurse asked if she was sitting and she stated that she was in a recliner with her legs elevated. She seemed to be upset that this nurse could not tell her what medications that she was on  or could not go talk to her PCP. She declined offers to make her an appointment and said that she lives in assisted living and can go get a nurse. This nurse told her that she can call back if she has any further needs and she verbalizes understanding. Disagree/Comply: Disagree Disagree/Comply Reason: Declined PCP Triage

## 2017-04-08 NOTE — Telephone Encounter (Signed)
Called patient,  whom advised nurse she had the RN at [facility] take her BP which was 150/80 patient could not advise of pulse. Patient stated the batteries in home cuff were weak. Patient also has a c/o head feeling full.  Starting 03/22/17 patient has been taking amlodipine 5 mg. Patient denies dizziness or being wobbly just states her head feels full.  You have an opening on Thursday at 9 am if you would like to see patient or Wednesday at 11:30.

## 2017-04-08 NOTE — Telephone Encounter (Signed)
Either one is fine.  Ask patient to increase her amlodipine to 5 mg daily for blood pressure

## 2017-04-08 NOTE — Telephone Encounter (Signed)
fyi

## 2017-04-08 NOTE — Telephone Encounter (Signed)
Patient has some dizziness, and suspects to have high blood pressure Pt was transferred to Nurse Line for dizziness  Pt contact [phone]

## 2017-04-10 DIAGNOSIS — R2681 Unsteadiness on feet: Secondary | ICD-10-CM | POA: Diagnosis not present

## 2017-04-11 ENCOUNTER — Encounter: Payer: Self-pay | Admitting: Internal Medicine

## 2017-04-11 ENCOUNTER — Ambulatory Visit (INDEPENDENT_AMBULATORY_CARE_PROVIDER_SITE_OTHER): Payer: Medicare Other | Admitting: Internal Medicine

## 2017-04-11 DIAGNOSIS — I10 Essential (primary) hypertension: Secondary | ICD-10-CM | POA: Diagnosis not present

## 2017-04-11 DIAGNOSIS — F411 Generalized anxiety disorder: Secondary | ICD-10-CM

## 2017-04-11 DIAGNOSIS — M810 Age-related osteoporosis without current pathological fracture: Secondary | ICD-10-CM | POA: Diagnosis not present

## 2017-04-11 MED ORDER — AMLODIPINE BESYLATE 10 MG PO TABS: 10.0000 mg | ORAL_TABLET | Freq: Every day | ORAL | 0 refills | Status: DC

## 2017-04-11 MED ORDER — AMLODIPINE BESYLATE 10 MG PO TABS: 10.0000 mg | ORAL_TABLET | Freq: Every day | ORAL | 1 refills | Status: DC

## 2017-04-11 NOTE — Patient Instructions (Signed)
Blood pressure can vary from hour to hour depending on many many factors  Make sure you check it when you are relaxed and resting  If your home readings are not < 130/80 by the end of next week , let me know and I will modify your regimen   DASH Eating Plan DASH stands for "Dietary Approaches to Stop Hypertension." The DASH eating plan is a healthy eating plan that has been shown to reduce high blood pressure (hypertension). It may also reduce your risk for type 2 diabetes, heart disease, and stroke. The DASH eating plan may also help with weight loss. What are tips for following this plan? General guidelines  Avoid eating more than 2,300 mg (milligrams) of salt (sodium) a day. If you have hypertension, you may need to reduce your sodium intake to 1,500 mg a day.  Limit alcohol intake to no more than 1 drink a day for nonpregnant women and 2 drinks a day for men. One drink equals 12 oz of beer, 5 oz of wine, or 1 oz of hard liquor.  Work with your health care provider to maintain a healthy body weight or to lose weight. Ask what an ideal weight is for you.  Get at least 30 minutes of exercise that causes your heart to beat faster (aerobic exercise) most days of the week. Activities may include walking, swimming, or biking.  Work with your health care provider or diet and nutrition specialist (dietitian) to adjust your eating plan to your individual calorie needs. Reading food labels  Check food labels for the amount of sodium per serving. Choose foods with less than 5 percent of the Daily Value of sodium. Generally, foods with less than 300 mg of sodium per serving fit into this eating plan.  To find whole grains, look for the word "whole" as the first word in the ingredient list. Shopping  Buy products labeled as "low-sodium" or "no salt added."  Buy fresh foods. Avoid canned foods and premade or frozen meals. Cooking  Avoid adding salt when cooking. Use salt-free seasonings or  herbs instead of table salt or sea salt. Check with your health care provider or pharmacist before using salt substitutes.  Do not fry foods. Cook foods using healthy methods such as baking, boiling, grilling, and broiling instead.  Cook with heart-healthy oils, such as olive, canola, soybean, or sunflower oil. Meal planning   Eat a balanced diet that includes: ? 5 or more servings of fruits and vegetables each day. At each meal, try to fill half of your plate with fruits and vegetables. ? Up to 6-8 servings of whole grains each day. ? Less than 6 oz of lean meat, poultry, or fish each day. A 3-oz serving of meat is about the same size as a deck of cards. One egg equals 1 oz. ? 2 servings of low-fat dairy each day. ? A serving of nuts, seeds, or beans 5 times each week. ? Heart-healthy fats. Healthy fats called Omega-3 fatty acids are found in foods such as flaxseeds and coldwater fish, like sardines, salmon, and mackerel.  Limit how much you eat of the following: ? Canned or prepackaged foods. ? Food that is high in trans fat, such as fried foods. ? Food that is high in saturated fat, such as fatty meat. ? Sweets, desserts, sugary drinks, and other foods with added sugar. ? Full-fat dairy products.  Do not salt foods before eating.  Try to eat at least 2 vegetarian meals each  week.  Eat more home-cooked food and less restaurant, buffet, and fast food.  When eating at a restaurant, ask that your food be prepared with less salt or no salt, if possible. What foods are recommended? The items listed may not be a complete list. Talk with your dietitian about what dietary choices are best for you. Grains Whole-grain or whole-wheat bread. Whole-grain or whole-wheat pasta. Brown rice. [place]ena Morrow. Bulgur. Whole-grain and low-sodium cereals. Pita bread. Low-fat, low-sodium crackers. Whole-wheat flour tortillas. Vegetables Fresh or frozen vegetables (raw, steamed, roasted, or grilled).  Low-sodium or reduced-sodium tomato and vegetable juice. Low-sodium or reduced-sodium tomato sauce and tomato paste. Low-sodium or reduced-sodium canned vegetables. Fruits All fresh, dried, or frozen fruit. Canned fruit in natural juice (without added sugar). Meat and other protein foods Skinless chicken or [place]. Ground chicken or [place]. Pork with fat trimmed off. Fish and seafood. Egg whites. Dried beans, peas, or lentils. Unsalted nuts, nut butters, and seeds. Unsalted canned beans. Lean cuts of beef with fat trimmed off. Low-sodium, lean deli meat. Dairy Low-fat (1%) or fat-free (skim) milk. Fat-free, low-fat, or reduced-fat cheeses. Nonfat, low-sodium ricotta or cottage cheese. Low-fat or nonfat yogurt. Low-fat, low-sodium cheese. Fats and oils Soft margarine without trans fats. Vegetable oil. Low-fat, reduced-fat, or light mayonnaise and salad dressings (reduced-sodium). Canola, safflower, olive, soybean, and sunflower oils. Avocado. Seasoning and other foods Herbs. Spices. Seasoning mixes without salt. Unsalted popcorn and pretzels. Fat-free sweets. What foods are not recommended? The items listed may not be a complete list. Talk with your dietitian about what dietary choices are best for you. Grains Baked goods made with fat, such as croissants, muffins, or some breads. Dry pasta or rice meal packs. Vegetables Creamed or fried vegetables. Vegetables in a cheese sauce. Regular canned vegetables (not low-sodium or reduced-sodium). Regular canned tomato sauce and paste (not low-sodium or reduced-sodium). Regular tomato and vegetable juice (not low-sodium or reduced-sodium). Angie Fava. Olives. Fruits Canned fruit in a light or heavy syrup. Fried fruit. Fruit in cream or butter sauce. Meat and other protein foods Fatty cuts of meat. Ribs. Fried meat. Berniece Salines. Sausage. Bologna and other processed lunch meats. Salami. Fatback. Hotdogs. Bratwurst. Salted nuts and seeds. Canned beans with added  salt. Canned or smoked fish. Whole eggs or egg yolks. Chicken or [place] with skin. Dairy Whole or 2% milk, cream, and half-and-half. Whole or full-fat cream cheese. Whole-fat or sweetened yogurt. Full-fat cheese. Nondairy creamers. Whipped toppings. Processed cheese and cheese spreads. Fats and oils Butter. Stick margarine. Lard. Shortening. Ghee. Bacon fat. Tropical oils, such as coconut, palm kernel, or palm oil. Seasoning and other foods Salted popcorn and pretzels. Onion salt, garlic salt, seasoned salt, table salt, and sea salt. Worcestershire sauce. Tartar sauce. Barbecue sauce. Teriyaki sauce. Soy sauce, including reduced-sodium. Steak sauce. Canned and packaged gravies. Fish sauce. Oyster sauce. Cocktail sauce. Horseradish that you find on the shelf. Ketchup. Mustard. Meat flavorings and tenderizers. Bouillon cubes. Hot sauce and Tabasco sauce. Premade or packaged marinades. Premade or packaged taco seasonings. Relishes. Regular salad dressings. Where to find more information:  National Heart, Lung, and [place]: https://wilson-eaton.com/  American Heart Association: www.heart.org Summary  The DASH eating plan is a healthy eating plan that has been shown to reduce high blood pressure (hypertension). It may also reduce your risk for type 2 diabetes, heart disease, and stroke.  With the DASH eating plan, you should limit salt (sodium) intake to 2,300 mg a day. If you have hypertension, you may need to reduce your  sodium intake to 1,500 mg a day.  When on the DASH eating plan, aim to eat more fresh fruits and vegetables, whole grains, lean proteins, low-fat dairy, and heart-healthy fats.  Work with your health care provider or diet and nutrition specialist (dietitian) to adjust your eating plan to your individual calorie needs. This information is not intended to replace advice given to you by your health care provider. Make sure you discuss any questions you have with your health care  provider. Document Released: 10/11/2011 Document Revised: 10/15/2016 Document Reviewed: 10/15/2016 Elsevier Interactive Patient Education  2017 [employer].
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
## 2017-04-13 NOTE — Assessment & Plan Note (Signed)
.  She reports compliance with medication regimen  but has only been taking 10mg  amlodipine for 3-4 days.   Goal BP is < 140/80 given her age. She has been asked to check her  BP in a week and  submit readings for evaluation.

## 2017-04-13 NOTE — Assessment & Plan Note (Addendum)
She has defers therapy  (Lexapro caused itching) due to intolerance of SSRI

## 2017-04-15 DIAGNOSIS — R2681 Unsteadiness on feet: Secondary | ICD-10-CM | POA: Diagnosis not present

## 2017-04-17 DIAGNOSIS — R2681 Unsteadiness on feet: Secondary | ICD-10-CM | POA: Diagnosis not present

## 2017-04-23 DIAGNOSIS — R2681 Unsteadiness on feet: Secondary | ICD-10-CM | POA: Diagnosis not present

## 2017-04-26 DIAGNOSIS — R2681 Unsteadiness on feet: Secondary | ICD-10-CM | POA: Diagnosis not present

## 2017-05-17 DIAGNOSIS — H40003 Preglaucoma, unspecified, bilateral: Secondary | ICD-10-CM | POA: Diagnosis not present

## 2017-06-03 ENCOUNTER — Ambulatory Visit: Payer: Medicare Other

## 2017-06-17 ENCOUNTER — Ambulatory Visit (INDEPENDENT_AMBULATORY_CARE_PROVIDER_SITE_OTHER): Payer: Medicare Other | Admitting: Internal Medicine

## 2017-06-17 ENCOUNTER — Encounter: Payer: Self-pay | Admitting: Internal Medicine

## 2017-06-17 DIAGNOSIS — R0989 Other specified symptoms and signs involving the circulatory and respiratory systems: Secondary | ICD-10-CM

## 2017-06-17 DIAGNOSIS — F411 Generalized anxiety disorder: Secondary | ICD-10-CM | POA: Diagnosis not present

## 2017-06-17 DIAGNOSIS — I10 Essential (primary) hypertension: Secondary | ICD-10-CM | POA: Diagnosis not present

## 2017-06-17 DIAGNOSIS — M810 Age-related osteoporosis without current pathological fracture: Secondary | ICD-10-CM
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
## 2017-06-17 NOTE — Patient Instructions (Addendum)
You have osteoporosis , based on your May DEXA scan , because your T scores are -2.5   I recommend that you consider some form of bone building therapy IN ADDITION TO CALCIUM   VITAMIN D AND WEIGHT BEARING EXERCISE, with one of the medications we discussed   ALENDRONATE (fosamax) RALOXIFENE (Evista) DENOSUMAB (Prolia)     yOu need 1000 Ius of D3 daily ,  And increase to 200) Ius daily from November to April,     Osteoporosis Osteoporosis is the thinning and loss of density in the bones. Osteoporosis makes the bones more brittle, fragile, and likely to break (fracture). Over time, osteoporosis can cause the bones to become so weak that they fracture after a simple fall. The bones most likely to fracture are the bones in the hip, wrist, and spine. What are the causes? The exact cause is not known. What increases the risk? Anyone can develop osteoporosis. You may be at greater risk if you have a family history of the condition or have poor nutrition. You may also have a higher risk if you are:  Female.  80 years old or older.  A smoker.  Not physically active.  White or Asian.  Slender.  What are the signs or symptoms? A fracture might be the first sign of the disease, especially if it results from a fall or injury that would not usually cause a bone to break. Other signs and symptoms include:  Low back and neck pain.  Stooped posture.  Height loss.  How is this diagnosed? To make a diagnosis, your health care provider may:  Take a medical history.  Perform a physical exam.  Order tests, such as: ? A bone mineral density test. ? A dual-energy X-ray absorptiometry test.  How is this treated? The goal of osteoporosis treatment is to strengthen your bones to reduce your risk of a fracture. Treatment may involve:  Making lifestyle changes, such as: ? Eating a diet rich in calcium. ? Doing weight-bearing and muscle-strengthening exercises. ? Stopping tobacco  use. ? Limiting alcohol intake.  Taking medicine to slow the process of bone loss or to increase bone density.  Monitoring your levels of calcium and vitamin D.  Follow these instructions at home:  Include calcium and vitamin D in your diet. Calcium is important for bone health, and vitamin D helps the body absorb calcium.  Perform weight-bearing and muscle-strengthening exercises as directed by your health care provider.  Do not use any tobacco products, including cigarettes, chewing tobacco, and electronic cigarettes. If you need help quitting, ask your health care provider.  Limit your alcohol intake.  Take medicines only as directed by your health care provider.  Keep all follow-up visits as directed by your health care provider. This is important.  Take precautions at home to lower your risk of falling, such as: ? Keeping rooms well lit and clutter free. ? Installing safety rails on stairs. ? Using rubber mats in the bathroom and other areas that are often wet or slippery. Get help right away if: You fall or injure yourself. This information is not intended to replace advice given to you by your health care provider. Make sure you discuss any questions you have with your health care provider. Document Released: 08/01/2005 Document Revised: 03/26/2016 Document Reviewed: 04/01/2014 Elsevier Interactive Patient Education  2017 [employer].

## 2017-06-18 ENCOUNTER — Telehealth: Payer: Self-pay | Admitting: Internal Medicine

## 2017-06-18 ENCOUNTER — Encounter: Payer: Self-pay | Admitting: Internal Medicine

## 2017-06-18 MED ORDER — AMLODIPINE BESYLATE 10 MG PO TABS: 10.0000 mg | ORAL_TABLET | Freq: Every day | ORAL | 1 refills | Status: DC

## 2017-06-18 NOTE — Assessment & Plan Note (Addendum)
follow up on bone density test indicating osteoporosis.  15 minutes was spent today in face to [place] time reviewing her report, discussing the risks and benefits of starting medication, and the available treatment options but she has deferred treatment at this time.

## 2017-06-18 NOTE — Telephone Encounter (Signed)
Pt called wanting to discuss medication options that she talked about at her last appt. Please advise

## 2017-06-18 NOTE — Assessment & Plan Note (Addendum)
Well controlled for age on current regimen of 10 mg daily.  Refills given.

## 2017-06-18 NOTE — Assessment & Plan Note (Signed)
She has mild untreated hyperlipidemia and less than hemodynamically significant carotid stenosis bilaterally .  she has deferred statin   Lab Results  Component Value Date   CHOL 250 (H) 02/08/2017   HDL 86.80 02/08/2017   LDLCALC 147 (H) 02/08/2017   LDLDIRECT 156.0 01/23/2017   TRIG 78.0 02/08/2017   CHOLHDL 3 02/08/2017

## 2017-06-18 NOTE — Assessment & Plan Note (Addendum)
She is primarily worried about her daughter's rejection of Christianity and the impact it will have on her grandchildren,  She has defers therapy  (Lexapro caused itching) due to intolerance of SSRI therapy.  A total of 25 minutes of face to face time was spent with patient today, including screening her for depression , counselling about the above mentioned conditions  and coordination of care

## 2017-06-19 ENCOUNTER — Other Ambulatory Visit: Payer: Self-pay | Admitting: Internal Medicine

## 2017-06-19 MED ORDER — RALOXIFENE HCL 60 MG PO TABS: 60.0000 mg | ORAL_TABLET | Freq: Every day | ORAL | 3 refills | Status: DC

## 2017-06-21 NOTE — Telephone Encounter (Signed)
Spoke with pt and she stated that the problem has been resolved. She stated that she was able to get into her mychart and send Dr. Derrel Nip a message.

## 2017-08-08 DIAGNOSIS — Z23 Encounter for immunization: Secondary | ICD-10-CM | POA: Diagnosis not present

## 2017-08-09 ENCOUNTER — Encounter: Payer: Self-pay | Admitting: Internal Medicine

## 2017-09-11 ENCOUNTER — Telehealth: Payer: Self-pay | Admitting: Internal Medicine

## 2017-09-11 ENCOUNTER — Other Ambulatory Visit: Payer: Self-pay | Admitting: Internal Medicine

## 2017-09-11 NOTE — Telephone Encounter (Signed)
Copied from [place] [phone]. Topic: Quick Communication - See Telephone Encounter >> Sep 11, 2017 10:52 AM Ether Griffins B wrote: CRM for notification. See Telephone encounter for:  Pt was called by [facility] for a refill on amlodpine pt has 61 pills left and doesn't need it refilled right now. A fax will be sent to the office from [facility] but patient doesn't need the refill for two months and is unsure of what she needs to do.  09/11/17.

## 2017-09-13 NOTE — Telephone Encounter (Signed)
Noted will not refill medication until pt calls for a refill.

## 2017-10-10 ENCOUNTER — Other Ambulatory Visit: Payer: Self-pay

## 2017-10-25 DIAGNOSIS — L821 Other seborrheic keratosis: Secondary | ICD-10-CM | POA: Diagnosis not present

## 2017-10-25 DIAGNOSIS — L814 Other melanin hyperpigmentation: Secondary | ICD-10-CM | POA: Diagnosis not present

## 2017-10-25 DIAGNOSIS — X32XXXA Exposure to sunlight, initial encounter: Secondary | ICD-10-CM | POA: Diagnosis not present

## 2017-11-11 ENCOUNTER — Other Ambulatory Visit: Payer: Self-pay | Admitting: Internal Medicine

## 2017-11-11 DIAGNOSIS — Z1231 Encounter for screening mammogram for malignant neoplasm of breast: Secondary | ICD-10-CM

## 2017-12-11 ENCOUNTER — Ambulatory Visit
Admission: RE | Admit: 2017-12-11 | Discharge: 2017-12-11 | Disposition: A | Discharge status: Home / Self Care | Payer: Medicare Other | Source: Ambulatory Visit | Attending: Internal Medicine | Admitting: Internal Medicine

## 2017-12-11 ENCOUNTER — Encounter: Payer: Self-pay | Admitting: Internal Medicine

## 2017-12-11 DIAGNOSIS — Z1231 Encounter for screening mammogram for malignant neoplasm of breast: Secondary | ICD-10-CM

## 2017-12-11 HISTORY — DX: Essential (primary) hypertension: I10

## 2017-12-13 DIAGNOSIS — L448 Other specified papulosquamous disorders: Secondary | ICD-10-CM | POA: Diagnosis not present

## 2017-12-27 ENCOUNTER — Ambulatory Visit
Admission: RE | Admit: 2017-12-27 | Discharge: 2017-12-27 | Disposition: A | Discharge status: Home / Self Care | Payer: Medicare Other | Source: Ambulatory Visit | Attending: Internal Medicine | Admitting: Internal Medicine

## 2017-12-27 ENCOUNTER — Ambulatory Visit: Payer: Self-pay

## 2017-12-27 ENCOUNTER — Other Ambulatory Visit: Payer: Self-pay | Admitting: Internal Medicine

## 2017-12-27 ENCOUNTER — Telehealth: Payer: Self-pay | Admitting: Internal Medicine

## 2017-12-27 DIAGNOSIS — M79652 Pain in left thigh: Secondary | ICD-10-CM

## 2017-12-27 NOTE — Telephone Encounter (Signed)
SHE NEEDS TO HAVE DVT RULED OUT.  I HAVE ORDERED A STAT ULTRASOUND OF HER LEFT THIGH .

## 2017-12-27 NOTE — Telephone Encounter (Signed)
[place] in chart negative for DVT.

## 2017-12-27 NOTE — Telephone Encounter (Signed)
Please advise 

## 2017-12-27 NOTE — Telephone Encounter (Signed)
Patient notified any going [place].

## 2017-12-27 NOTE — Telephone Encounter (Signed)
Left leg from knee to hip feels sort of  like a cramp, it helps to walk, no redness or swelling no warmth or heat to area.  Massaging the area does not help ( advised patient not to massage.) Hurts enough to cause her to limp comes and goes as she walks the pain gets  better but sitting gets worse, pain rated at 3 and walking up steps makes pain worse.

## 2017-12-27 NOTE — Telephone Encounter (Signed)
Pt. C/o left leg pain between the "knee and hip in that muscle " per pt. States it feels a little better after walking, but does have some limping with this. Is Concerned because this coming Tuesday is going on a trip to [place] on a walking tour. Wants to know what she can do for this.  Reason for Disposition . [1] MODERATE pain (e.g., interferes with normal activities, limping) AND [2] present > 3 days  Answer Assessment - Initial Assessment Questions 1. LOCATION and RADIATION: "Where is the pain located?"      Left knee to hip 2. QUALITY: "What does the pain feel like?"  (e.g., sharp, dull, aching, burning)     Cramp 3. SEVERITY: "How bad is the pain?" "What does it keep you from doing?"   (Scale 1-10; or mild, moderate, severe)   -  MILD (1-3): doesn't interfere with normal activities    -  MODERATE (4-7): interferes with normal activities (e.g., work or school) or awakens from sleep, limping    -  SEVERE (8-10): excruciating pain, unable to do any normal activities, unable to walk     3 4. ONSET: "When did the pain start?" "Does it come and go, or is it there all the time?"     Started 2-3 days ago 5. RECURRENT: "Have you had this pain before?" If so, ask: "When, and what happened then?"     No 6. SETTING: "Has there been any recent work, exercise or other activity that involved that part of the body?"      No 7. AGGRAVATING FACTORS: "What makes the knee pain worse?" (e.g., walking, climbing stairs, running)     Stairs 8. ASSOCIATED SYMPTOMS: "Is there any swelling or redness of the knee?"     No 9. OTHER SYMPTOMS: "Do you have any other symptoms?" (e.g., chest pain, difficulty breathing, fever, calf pain)     No 10. PREGNANCY: "Is there any chance you are pregnant?" "When was your last menstrual period?"       No  Protocols used: KNEE PAIN-A-AH

## 2017-12-27 NOTE — Telephone Encounter (Signed)
Patient notified and voiced understanding.

## 2017-12-27 NOTE — Telephone Encounter (Signed)
I cannot advice without seeing her because the onfo is not detailed enough.  Front , back or medial or lateral thigh?  Does it radiate?  Is there any swelling or redness?  .  It could be a blood clot,  itdo not have enough information

## 2017-12-27 NOTE — Telephone Encounter (Signed)
No DVT .  She can try using ice alternating with heat (15 mintues) for probable muscle strain .  She has normal renal function so she can use aleve twice daily or motrin 3 times daily as needed ,  Along with tylenol up to 2000 mg daily

## 2018-02-12 ENCOUNTER — Ambulatory Visit (INDEPENDENT_AMBULATORY_CARE_PROVIDER_SITE_OTHER): Payer: Medicare Other

## 2018-02-12 VITALS — BP 148/62 | HR 76 | Temp 98.3°F | Resp 12 | Ht 66.0 in | Wt 154.0 lb

## 2018-02-12 DIAGNOSIS — Z0000 Encounter for general adult medical examination without abnormal findings: Secondary | ICD-10-CM | POA: Diagnosis not present

## 2018-02-12 NOTE — Patient Instructions (Addendum)
  Elaine Price , Thank you for taking time to come for your Medicare Wellness Visit. I appreciate your ongoing commitment to your health goals. Please review the following plan we discussed and let me know if I can assist you in the future.   Follow up as needed.    Bring a copy of your [place] and/or Living Will to be scanned into chart.  Have a great day!  These are the goals we discussed: Goals    . Maintain Healthy Lifestyle       This is a list of the screening recommended for you and due dates:  Health Maintenance  Topic Date Due  . Flu Shot  06/05/2018  . Mammogram  12/11/2018  . Tetanus Vaccine  01/19/2025  . DEXA scan (bone density measurement)  Completed  . Pneumonia vaccines  Completed

## 2018-02-12 NOTE — Progress Notes (Signed)
Subjective:   Elaine Price is a 83 y.o. female who presents for Medicare Annual (Subsequent) preventive examination.  Review of Systems:  No ROS.  Medicare Wellness Visit. Additional risk factors are reflected in the social history.   Cardiac Risk Factors include: advanced age (>59men, >62 women);hypertension     Objective:     Vitals: BP (!) 148/62 (BP Location: Left Arm, Patient Position: Sitting, Cuff Size: Normal)   Pulse 76   Temp 98.3 F (36.8 C) (Oral)   Resp 12   Ht 5\' 6"  (1.676 m)   Wt 154 lb (69.9 kg)   SpO2 98%   BMI 24.86 kg/m   Body mass index is 24.86 kg/m.  Advanced Directives 02/12/2018 05/31/2016 07/04/2015 07/01/2015  Does Patient Have a Medical Advance Directive? Yes Yes Yes Yes  Type of Paramedic of [place];Living will [place];Living will - [place];Living will  Does patient want to make changes to medical advance directive? No - Patient declined - No - Patient declined -  Copy of [place] in Chart? No - copy requested No - copy requested No - copy requested -    Tobacco Social History   Tobacco Use  Smoking Status Never Smoker  Smokeless Tobacco Never Used     Counseling given: Not Answered   Clinical Intake:  Pre-visit preparation completed: Yes  Pain : No/denies pain     Nutritional Status: BMI of 19-24  Normal Diabetes: No  How often do you need to have someone help you when you read instructions, pamphlets, or other written materials from your doctor or pharmacy?: 1 - Never  Interpreter Needed?: No     Past Medical History:  Diagnosis Date  . Depression   . Diverticulosis   . Essential hypertension   . Gallstones   . Gastritis and duodenitis    dounf by CT Nov 2012  . Hyperlipemia   . Pneumonia    hx of   Past Surgical History:  Procedure Laterality Date  . ABDOMINAL HYSTERECTOMY    . ACROMIO-CLAVICULAR JOINT REPAIR Right  07/04/2015   Procedure: ACROMIO-CLAVICULAR JOINT REPAIR;  Surgeon: Tania Ade, MD;  Location: [facility];  Service: Orthopedics;  Laterality: Right;  Right open reduction internal fixation clavical with allograft, coracoclaviular reconstruction  . APPENDECTOMY    . CHOLECYSTECTOMY  2012  . CHOLECYSTECTOMY, LAPAROSCOPIC  08/06/2011   gallstones  . COLONOSCOPY  02/18/2006   2 mm rectal polyp, diverticulosis  . ORIF CLAVICULAR FRACTURE Right 07/04/2015   Procedure: OPEN REDUCTION INTERNAL FIXATION (ORIF) CLAVICULAR FRACTURE;  Surgeon: Tania Ade, MD;  Location: [facility];  Service: Orthopedics;  Laterality: Right;  . TONSILECTOMY, ADENOIDECTOMY, BILATERAL MYRINGOTOMY AND TUBES     as a child, does not remember date   Family History  Problem Relation Age of Onset  . Stroke Mother   . Prostate cancer Father   . Lung cancer Brother   . Prostate cancer Brother   . Colon cancer Neg Hx    Social History   Socioeconomic History  . Marital status: Married    Spouse name: Not on file  . Number of children: Not on file  . Years of education: Not on file  . Highest education level: Not on file  Occupational History  . 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 Financial resource strain: Not hard at all  . Food insecurity:    Worry:  Never true    Inability: Never true  . Transportation needs:    Medical: No    Non-medical: No  Tobacco Use  . Smoking status: Never Smoker  . Smokeless tobacco: Never Used  Substance and Sexual Activity  . Alcohol use: No    Comment: rare  . Drug use: No  . Sexual activity: Never  Lifestyle  . Physical activity:    Days per week: Not on file    Minutes per session: Not on file  . Stress: Not on file  Relationships  . Social connections:    Talks on phone: Not on file    Gets together: Not on file    Attends religious service: Not on file    Active member of club or organization: Not on file      Attends meetings of clubs or organizations: Not on file    Relationship status: Not on file  Other Topics Concern  . Not on file  Social History Narrative  . Not on file    Outpatient Encounter Medications as of 02/12/2018  Medication Sig  . [DISCONTINUED] amLODipine (NORVASC) 10 MG tablet TAKE 1 TABLET EVERY DAY  . [DISCONTINUED] aspirin 81 MG tablet Take 81 mg by mouth daily.    . [DISCONTINUED] Calcium Carbonate-Vit D-Min (CALCIUM 1200 PO) Take 1,200 mg by mouth daily.  . [DISCONTINUED] raloxifene (EVISTA) 60 MG tablet Take 1 tablet (60 mg total) by mouth daily.   No facility-administered encounter medications on file as of 02/12/2018.     Activities of Daily Living In your present state of health, do you have any difficulty performing the following activities: 02/12/2018  Hearing? N  Vision? N  Difficulty concentrating or making decisions? N  Walking or climbing stairs? N  Dressing or bathing? N  Doing errands, shopping? N  Preparing Food and eating ? N  Using the Toilet? N  In the past six months, have you accidently leaked urine? N  Do you have problems with loss of bowel control? N  Managing your Medications? N  Managing your Finances? N  Housekeeping or managing your Housekeeping? N  Some recent data might be hidden    Patient Care Team: Crecencio Mc, MD as PCP - General (Internal Medicine)    Assessment:   This is a routine wellness examination for Elaine Price.  The goal of the wellness visit is to assist the patient how to close the gaps in care and create a preventative care plan for the patient.   The roster of all physicians providing medical care to patient is listed in the Snapshot section of the chart.  Osteoporosis reviewed.  Requests bone scan at next visit with pcp.  Safety issues reviewed; Smoke and carbon monoxide detectors in the home. No firearms he home. Wears seatbelts when driving or riding with others. No violence in the home.  They do not  have excessive sun exposure.  Discussed the need for sun protection: hats, long sleeves and the use of sunscreen if there is significant sun exposure.  Patient is alert, normal appearance, oriented to person/place/and time.  Correctly identified the president of the [place] and recalls of 3/3 words. Performs simple calculations and can read correct time from watch face. Displays appropriate judgement.  No new identified risk were noted.  No failures at ADL's or IADL's.    BMI- discussed the importance of a healthy diet, water intake and the benefits of aerobic exercise. Educational material provided.   24 hour diet recall:  Regular diet  Dental- every 6 months.    Eye- Visual acuity not assessed per patient preference since they have regular follow up with the ophthalmologist.  Wears corrective lenses.  Sleep patterns- Sleeps through the night without issues.      Health maintenance gaps- closed.  Patient Concerns: Reoccurring L thigh pain.  No injury. No falls. No muscle strain. Uses heat 15 minutes off/on as needed.  Deferred to pcp for follow up.  Appointment scheduled.    Medications-  All medications discontinued per patient preference this visit.  Taking natural supplements not found in the database.  ZemBright MoodPlus for the blues, Forward Gold Daily Regimen plus probiotic for brain and bone health, Vision Essentials Gold, Bladder Control Advantage. Educational material placed in pcp box for review.    Exercise Activities and Dietary recommendations Current Exercise Habits: Home exercise routine, Type of exercise: walking, Time (Minutes): 40, Frequency (Times/Week): 7, Weekly Exercise (Minutes/Week): 280, Intensity: Mild  Goals    . Maintain Healthy Lifestyle       Fall Risk Fall Risk  02/12/2018 10/10/2017 10/19/2016 05/31/2016 11/23/2014  Falls in the past year? No No No No No  Comment - Emmi Telephone Survey: data to providers prior to load - - -   Depression Screen PHQ  2/9 Scores 02/12/2018 10/19/2016 05/31/2016 11/23/2014  PHQ - 2 Score 0 0 - 1  Exception Documentation - - Other- indicate reason in comment box -  Not completed - - Wavering emotion from death of spouse and family issues.  Followed by PCP. -     Cognitive Function MMSE - Mini Mental State Exam 05/31/2016  Orientation to time 5  Orientation to Place 5  Registration 3  Attention/ Calculation 5  Recall 3  Language- name 2 objects 2  Language- repeat 1  Language- follow 3 step command 3  Language- read & follow direction 1  Write a sentence 1  Copy design 1  Total score 30     6CIT Screen 02/12/2018  What Year? 0 points  What month? 0 points  What time? 0 points  Count back from 20 0 points  Months in reverse 0 points  Repeat phrase 0 points  Total Score 0    Immunization History  Administered Date(s) Administered  . Influenza Split 07/24/2014  . Influenza,inj,Quad PF,6+ Mos 07/08/2015  . Influenza-Unspecified 08/05/2012, 07/27/2013, 08/04/2014, 08/08/2017  . Pneumococcal Conjugate-13 05/31/2016  . Pneumococcal Polysaccharide-23 11/23/2006, 11/23/2014  . Td 01/20/2015  . Zoster 02/04/2009   Screening Tests Health Maintenance  Topic Date Due  . INFLUENZA VACCINE  06/05/2018  . MAMMOGRAM  12/11/2018  . TETANUS/TDAP  01/19/2025  . DEXA SCAN  Completed  . PNA vac Low Risk Adult  Completed     Plan:    End of life planning; Advance aging; Advanced directives discussed. Copy of current HCPOA/Living Will requested.    I have personally reviewed and noted the following in the patient's chart:   . Medical and social history . Use of alcohol, tobacco or illicit drugs  . Current medications and supplements . Functional ability and status . Nutritional status . Physical activity . Advanced directives . List of other physicians . Hospitalizations, surgeries, and ER visits in previous 12 months . Vitals . Screenings to include cognitive, depression, and  falls . Referrals and appointments  In addition, I have reviewed and discussed with patient certain preventive protocols, quality metrics, and best practice recommendations. A written personalized care plan for preventive services as well  as general preventive health recommendations were provided to patient.     Varney Biles, LPN  3/00/5110

## 2018-02-17 ENCOUNTER — Encounter: Payer: Self-pay | Admitting: Internal Medicine

## 2018-02-17 ENCOUNTER — Ambulatory Visit (INDEPENDENT_AMBULATORY_CARE_PROVIDER_SITE_OTHER): Payer: Medicare Other | Admitting: Internal Medicine

## 2018-02-17 ENCOUNTER — Ambulatory Visit (INDEPENDENT_AMBULATORY_CARE_PROVIDER_SITE_OTHER): Payer: Medicare Other

## 2018-02-17 VITALS — BP 162/68 | HR 67 | Temp 98.1°F | Resp 15

## 2018-02-17 DIAGNOSIS — M1612 Unilateral primary osteoarthritis, left hip: Secondary | ICD-10-CM | POA: Diagnosis not present

## 2018-02-17 DIAGNOSIS — I739 Peripheral vascular disease, unspecified: Secondary | ICD-10-CM | POA: Diagnosis not present

## 2018-02-17 DIAGNOSIS — M25552 Pain in left hip: Secondary | ICD-10-CM

## 2018-02-17 DIAGNOSIS — E559 Vitamin D deficiency, unspecified: Secondary | ICD-10-CM

## 2018-02-17 DIAGNOSIS — G8929 Other chronic pain: Secondary | ICD-10-CM | POA: Diagnosis not present

## 2018-02-17 DIAGNOSIS — E7800 Pure hypercholesterolemia, unspecified: Secondary | ICD-10-CM

## 2018-02-17 DIAGNOSIS — M810 Age-related osteoporosis without current pathological fracture: Secondary | ICD-10-CM

## 2018-02-17 DIAGNOSIS — M25551 Pain in right hip: Secondary | ICD-10-CM

## 2018-02-17 DIAGNOSIS — I10 Essential (primary) hypertension: Secondary | ICD-10-CM

## 2018-02-17 DIAGNOSIS — M47816 Spondylosis without myelopathy or radiculopathy, lumbar region: Secondary | ICD-10-CM | POA: Diagnosis not present

## 2018-02-17 DIAGNOSIS — Z9114 Patient's other noncompliance with medication regimen: Secondary | ICD-10-CM

## 2018-02-17 MED ORDER — TURMERIC 500 MG PO CAPS: 1.0000 | ORAL_CAPSULE | Freq: Two times a day (BID) | ORAL | 2 refills | Status: DC

## 2018-02-17 MED ORDER — TELMISARTAN 20 MG PO TABS: 20.0000 mg | ORAL_TABLET | Freq: Every day | ORAL | 1 refills | Status: DC

## 2018-02-17 NOTE — Patient Instructions (Addendum)
Your blood pressure is very high, so I am recommending that you start taking telmisartan today .  Take it tonight,  And  Take it again starting tomorrow once daily in the morning.   I may recommend another medication once I see your labs from  today   Have your blood pressure rechecked in one week by the RN at [facility] can use tylenol (up to 2000 mg daily) and turmeric 500 mg twice daily for your hip pain

## 2018-02-17 NOTE — Progress Notes (Signed)
Subjective:  Patient ID: Elaine Price, female    DOB: August 27, 1935  Age: 83 y.o. MRN: [id number]  CC: The primary encounter diagnosis was Hip pain, acute, left. Diagnoses of Essential hypertension, Pure hypercholesterolemia, Vitamin D deficiency, Peripheral vascular disease ([place]), Chronic hip pain, right, Nonadherence to medication, and Age-related osteoporosis without current pathological fracture were also pertinent to this visit.  HPI Elaine Price presents for follow up on chronic conditions including hypertension and osteoporosis .  She ws last seen in August and amlodipine  was prescribed for management of  hypertension .  Since her last visit in August patient has decided to stop all of her medications but has been using  "natural" commercial remedies  She was tolerating amlodipine without side effects,  But states that she read an article from some well known doctor that warned against use of  amlodipine' and so she stopped taking it about  6 months ago.  Her home readings have been elevated when checked infrequently.  She has not been exercising , including walking,  Due to persistentleft lateral thigh pain that started several weeks after she returned from her walking tour of [place] in February.    In anticipation of today's visit she dropped off several pages of an unknown catologue last week that she used to purchase the commercial "natural " products she has been using for "wellbeing,  Vision , and insomnia.' . She states that she expected me to research these and to tell her today if they were ok to continue .   She also discontinued Evista after only several weeks of use , again due to fear of side effects.  Last Dexa was May 2018.  She was advised today that it was too soon to repeat the test.   Left lateral upper thigh  still painful  . Intermittent . Improves  with rest and is aggravated by walking. Had no pain during trip to [place]  And did lots of walking,          No  outpatient medications prior to visit.   No facility-administered medications prior to visit.     Review of Systems;  Patient denies headache, fevers, malaise, unintentional weight loss, skin rash, eye pain, sinus congestion and sinus pain, sore throat, dysphagia,  hemoptysis , cough, dyspnea, wheezing, chest pain, palpitations, orthopnea, edema, abdominal pain, nausea, melena, diarrhea, constipation, flank pain, dysuria, hematuria, urinary  Frequency, nocturia, numbness, tingling, seizures,  Focal weakness, Loss of consciousness,  Tremor, insomnia, depression, anxiety, and suicidal ideation.      Objective:  BP (!) 162/68 (BP Location: Left Arm, Patient Position: Sitting, Cuff Size: Normal)   Pulse 67   Temp 98.1 F (36.7 C) (Oral)   Resp 15   SpO2 98%   BP Readings from Last 3 Encounters:  02/17/18 (!) 162/68  02/12/18 (!) 148/62  06/17/17 140/60    Wt Readings from Last 3 Encounters:  02/12/18 154 lb (69.9 kg)  06/17/17 153 lb (69.4 kg)  04/11/17 155 lb 6.4 oz (70.5 kg)    General appearance: alert, cooperative and appears stated age Ears: normal TM's and external ear canals both ears Throat: lips, mucosa, and tongue normal; teeth and gums normal Neck: no adenopathy, no carotid bruit, supple, symmetrical, trachea midline and thyroid not enlarged, symmetric, no tenderness/mass/nodules Back: symmetric, no curvature. ROM normal. No CVA tenderness. Lungs: clear to auscultation bilaterally Heart: regular rate and rhythm, S1, S2 normal, no murmur, click, rub or gallop Abdomen: soft, non-tender; bowel sounds  normal; no masses,  no organomegaly Pulses: 2+ and symmetric Skin: Skin color, texture, turgor normal. No rashes or lesions Lymph nodes: Cervical, supraclavicular, and axillary nodes normal.  MSK: pain elicited with external rotation of left hip   Lab Results  Component Value Date   HGBA1C 5.9 01/23/2017    Lab Results  Component Value Date   CREATININE 0.73  01/23/2017   CREATININE 0.68 10/24/2016   CREATININE 0.74 10/19/2016    Lab Results  Component Value Date   WBC 10.9 (H) 10/24/2016   HGB 13.2 10/24/2016   HCT 40.1 10/24/2016   PLT 336.0 10/24/2016   GLUCOSE 88 01/23/2017   CHOL 250 (H) 02/08/2017   TRIG 78.0 02/08/2017   HDL 86.80 02/08/2017   LDLDIRECT 156.0 01/23/2017   LDLCALC 147 (H) 02/08/2017   ALT 16 01/23/2017   AST 19 01/23/2017   NA 139 01/23/2017   K 4.5 01/23/2017   CL 102 01/23/2017   CREATININE 0.73 01/23/2017   BUN 22 01/23/2017   CO2 31 01/23/2017   TSH 1.02 10/24/2016   HGBA1C 5.9 01/23/2017    US Venous Img Lower Unilateral Left  Result Date: 12/27/2017 CLINICAL DATA:  Left thigh pain for 3 day EXAM: LEFT LOWER EXTREMITY VENOUS DUPLEX ULTRASOUND TECHNIQUE: Doppler venous assessment of the left lower extremity deep venous system was performed, including characterization of spectral flow, compressibility, and phasicity. COMPARISON:  None. FINDINGS: There is complete compressibility of the left common femoral, femoral, and popliteal veins. Doppler analysis demonstrates respiratory phasicity and augmentation of flow with calf compression. No obvious superficial vein or calf vein thrombosis. IMPRESSION: No evidence of left lower extremity DVT. Electronically Signed   By: Marybelle Killings M.D.   On: 12/27/2017 14:21    Assessment & Plan:   Problem List Items Addressed This Visit    Peripheral vascular disease ([place])    She has mild untreated hyperlipidemia ,(10 yr risk of CAD > 25%), hypertension, aortic atherosclerosis ( noted on radiographs of spine today) and less than hemodynamically significant carotid stenosis bilaterally .  she continues to defer statin therapy and is due for repeat assessment   Lab Results  Component Value Date   CHOL 250 (H) 02/08/2017   HDL 86.80 02/08/2017   LDLCALC 147 (H) 02/08/2017   LDLDIRECT 156.0 01/23/2017   TRIG 78.0 02/08/2017   CHOLHDL 3 02/08/2017         Relevant  Medications   telmisartan (MICARDIS) 20 MG tablet   Osteoporosis    Untreated per patient preference and distrust of prescribed medications. Repeat DEXA denied,  Since it was just done in May 2018.       Nonadherence to medication    I had a rather frank discussion with patient about her misplaced trust in "natural supplements?" and distrust in FDA approved medications.  I told her I had no intention of researching these commercial for her and any other patient who chooses to use them and that using them was at her own risk.  The most I can offer her is to monitor her thyroid, , liver and kidney function (and CBC ) if she continues to use them       Essential hypertension    I advised her that I knew of no reason to dc amlodipine,  that stopping her medication without advising me was in poor judgement, and that  having uncontrolled hypertension for 6 months  increases her risk of heart attack and stroke .  telmisartan prescribed today .  BP and BMET will need  rechecking by RN either here or at [facility] in one week.  Advised to avoid aleve and advil until BP is under control   renal function and urinary  protein labs are pending   No results found for: Derl Barrow Lab Results  Component Value Date   CREATININE 0.73 01/23/2017         Relevant Medications   telmisartan (MICARDIS) 20 MG tablet   Other Relevant Orders   Microalbumin / creatinine urine ratio   Comprehensive metabolic panel   Chronic hip pain, right    Lateral,  Since early march,  No history of falls, started weeks after her walking tour of [place].  Trochanteric bursitis suspected.   Hip x ray and lumbar spine films today show no acute changes,  But diffuse osteopenia, degenerative changes and 5 mm of anterolisthesis of L4 on L5 , and aortic atherosclerosis .   Trial of turmeric 500 mg bid and tylenol.   If no improvement she will consider orthopedic referral for I/A steroid injection        Other Visit  Diagnoses    Hip pain, acute, left    -  Primary   Relevant Orders   DG HIP UNILAT WITH PELVIS 2-3 VIEWS LEFT (Completed)   DG Lumbar Spine Complete (Completed)   Pure hypercholesterolemia       Relevant Medications   telmisartan (MICARDIS) 20 MG tablet   Other Relevant Orders   Lipid panel   TSH   Vitamin D deficiency       Relevant Orders   VITAMIN D 25 Hydroxy (Vit-D Deficiency, Fractures)    A total of 40 minutes was spent with patient more than half of which was spent in counseling patient on the above mentioned issues , reviewing and explaining recent labs and imaging studies done, and coordination of care.  I am having Elaine Faster "Bethena Roys" start on Turmeric. I am also having her maintain her telmisartan.  Meds ordered this encounter  Medications  . Turmeric 500 MG CAPS    Sig: Take 1 capsule by mouth 2 (two) times daily.    Dispense:  60 capsule    Refill:  2  . DISCONTD: telmisartan (MICARDIS) 20 MG tablet    Sig: Take 1 tablet (20 mg total) by mouth daily.    Dispense:  90 tablet    Refill:  1  . telmisartan (MICARDIS) 20 MG tablet    Sig: Take 1 tablet (20 mg total) by mouth daily.    Dispense:  90 tablet    Refill:  1    Medications Discontinued During This Encounter  Medication Reason  . telmisartan (MICARDIS) 20 MG tablet Reorder    Follow-up: No follow-ups on file.   Crecencio Mc, MD

## 2018-02-18 DIAGNOSIS — Z9114 Patient's other noncompliance with medication regimen: Secondary | ICD-10-CM | POA: Insufficient documentation

## 2018-02-18 DIAGNOSIS — G8929 Other chronic pain: Secondary | ICD-10-CM | POA: Insufficient documentation

## 2018-02-18 DIAGNOSIS — M25551 Pain in right hip: Secondary | ICD-10-CM

## 2018-02-18 LAB — TSH: TSH: 2.4 u[IU]/mL (ref 0.35–4.50)

## 2018-02-18 LAB — COMPREHENSIVE METABOLIC PANEL
ALT: 12 U/L (ref 0–35)
AST: 18 U/L (ref 0–37)
Albumin: 4.3 g/dL (ref 3.5–5.2)
Alkaline Phosphatase: 80 U/L (ref 39–117)
BUN: 17 mg/dL (ref 6–23)
CHLORIDE: 102 meq/L (ref 96–112)
CO2: 29 meq/L (ref 19–32)
CREATININE: 0.66 mg/dL (ref 0.40–1.20)
Calcium: 9.8 mg/dL (ref 8.4–10.5)
GFR: 91.39 mL/min (ref >60.00)
GLUCOSE: 74 mg/dL (ref 70–99)
POTASSIUM: 4.4 meq/L (ref 3.5–5.1)
SODIUM: 140 meq/L (ref 135–145)
Total Bilirubin: 0.4 mg/dL (ref 0.2–1.2)
Total Protein: 7.3 g/dL (ref 6.0–8.3)

## 2018-02-18 LAB — LIPID PANEL
CHOL/HDL RATIO: 3
CHOLESTEROL: 199 mg/dL (ref 0–200)
HDL: 70.1 mg/dL (ref >39.00)
LDL CALC: 106 mg/dL — AB (ref 0–99)
NonHDL: 129.01
Triglycerides: 114 mg/dL (ref 0.0–149.0)
VLDL: 22.8 mg/dL (ref 0.0–40.0)

## 2018-02-18 LAB — MICROALBUMIN / CREATININE URINE RATIO
CREATININE, U: 66.4 mg/dL
MICROALB UR: 5.1 mg/dL — AB (ref 0.0–1.9)
MICROALB/CREAT RATIO: 7.7 mg/g (ref 0.0–30.0)

## 2018-02-18 LAB — VITAMIN D 25 HYDROXY (VIT D DEFICIENCY, FRACTURES): VITD: 41.96 ng/mL (ref 30.00–100.00)

## 2018-02-18 NOTE — Addendum Note (Signed)
Addended by: Crecencio Mc on: 02/18/2018 10:45 PM   Modules accepted: Orders

## 2018-02-18 NOTE — Assessment & Plan Note (Addendum)
She has mild untreated hyperlipidemia ,(10 yr risk of CAD > 25%), hypertension, aortic atherosclerosis ( noted on radiographs of spine today) and less than hemodynamically significant carotid stenosis bilaterally .  she continues to defer statin therapy and is due for repeat assessment   Lab Results  Component Value Date   CHOL 250 (H) 02/08/2017   HDL 86.80 02/08/2017   LDLCALC 147 (H) 02/08/2017   LDLDIRECT 156.0 01/23/2017   TRIG 78.0 02/08/2017   CHOLHDL 3 02/08/2017

## 2018-02-18 NOTE — Assessment & Plan Note (Signed)
Untreated per patient preference and distrust of prescribed medications. Repeat DEXA denied,  Since it was just done in May 2018.

## 2018-02-18 NOTE — Assessment & Plan Note (Signed)
I advised her that I knew of no reason to dc amlodipine,  that stopping her medication without advising me was in poor judgement, and that  having uncontrolled hypertension for 6 months  increases her risk of heart attack and stroke .  telmisartan prescribed today .  BP and BMET will need  rechecking by RN either here or at [facility] in one week.  Advised to avoid aleve and advil until BP is under control   renal function and urinary  protein labs are pending   No results found for: Derl Barrow Lab Results  Component Value Date   CREATININE 0.73 01/23/2017

## 2018-02-18 NOTE — Assessment & Plan Note (Signed)
I had a rather frank discussion with patient about her misplaced trust in "natural supplements?" and distrust in FDA approved medications.  I told her I had no intention of researching these commercial for her and any other patient who chooses to use them and that using them was at her own risk.  The most I can offer her is to monitor her thyroid, , liver and kidney function (and CBC ) if she continues to use them

## 2018-02-18 NOTE — Assessment & Plan Note (Signed)
Lateral,  Since early march,  No history of falls, started weeks after her walking tour of [place].  Trochanteric bursitis suspected.   Hip x ray and lumbar spine films today show no acute changes,  But diffuse osteopenia, degenerative changes and 5 mm of anterolisthesis of L4 on L5 , and aortic atherosclerosis .   Trial of turmeric 500 mg bid and tylenol.   If no improvement she will consider orthopedic referral for I/A steroid injection

## 2018-02-24 ENCOUNTER — Other Ambulatory Visit (INDEPENDENT_AMBULATORY_CARE_PROVIDER_SITE_OTHER): Payer: Medicare Other

## 2018-02-24 DIAGNOSIS — I10 Essential (primary) hypertension: Secondary | ICD-10-CM | POA: Diagnosis not present

## 2018-02-24 LAB — BASIC METABOLIC PANEL
BUN: 19 mg/dL (ref 6–23)
CALCIUM: 9.4 mg/dL (ref 8.4–10.5)
CHLORIDE: 101 meq/L (ref 96–112)
CO2: 29 meq/L (ref 19–32)
CREATININE: 0.89 mg/dL (ref 0.40–1.20)
GFR: 64.72 mL/min (ref >60.00)
GLUCOSE: 73 mg/dL (ref 70–99)
Potassium: 4.3 mEq/L (ref 3.5–5.1)
Sodium: 139 mEq/L (ref 135–145)

## 2018-02-25 ENCOUNTER — Encounter: Payer: Self-pay | Admitting: Internal Medicine

## 2018-02-25 DIAGNOSIS — G8929 Other chronic pain: Secondary | ICD-10-CM

## 2018-02-25 DIAGNOSIS — M25551 Pain in right hip: Principal | ICD-10-CM

## 2018-02-26 ENCOUNTER — Telehealth: Payer: Self-pay | Admitting: Internal Medicine

## 2018-02-26 ENCOUNTER — Other Ambulatory Visit: Payer: Self-pay

## 2018-02-26 DIAGNOSIS — I10 Essential (primary) hypertension: Secondary | ICD-10-CM

## 2018-02-26 MED ORDER — TELMISARTAN 40 MG PO TABS: 40.0000 mg | ORAL_TABLET | Freq: Every day | ORAL | 0 refills | Status: DC

## 2018-02-26 NOTE — Telephone Encounter (Signed)
telmisartan dose increased. Updated  med list

## 2018-02-26 NOTE — Assessment & Plan Note (Signed)
Improved but not at goal..  Increasing  telmisartan to 40 mg daily .. bmet normal.

## 2018-03-04 MED ORDER — TELMISARTAN 40 MG PO TABS: 40.0000 mg | ORAL_TABLET | Freq: Every day | ORAL | 0 refills | Status: DC

## 2018-03-10 DIAGNOSIS — R2689 Other abnormalities of gait and mobility: Secondary | ICD-10-CM | POA: Diagnosis not present

## 2018-03-10 DIAGNOSIS — G8929 Other chronic pain: Secondary | ICD-10-CM | POA: Diagnosis not present

## 2018-03-11 NOTE — Telephone Encounter (Signed)
Referral is being refaxed.

## 2018-03-12 ENCOUNTER — Encounter: Payer: Self-pay | Admitting: Internal Medicine

## 2018-03-12 DIAGNOSIS — R2689 Other abnormalities of gait and mobility: Secondary | ICD-10-CM | POA: Diagnosis not present

## 2018-03-12 DIAGNOSIS — G8929 Other chronic pain: Secondary | ICD-10-CM | POA: Diagnosis not present

## 2018-03-13 DIAGNOSIS — G8929 Other chronic pain: Secondary | ICD-10-CM | POA: Diagnosis not present

## 2018-03-13 DIAGNOSIS — R2689 Other abnormalities of gait and mobility: Secondary | ICD-10-CM | POA: Diagnosis not present

## 2018-03-17 DIAGNOSIS — G8929 Other chronic pain: Secondary | ICD-10-CM | POA: Diagnosis not present

## 2018-03-17 DIAGNOSIS — R2689 Other abnormalities of gait and mobility: Secondary | ICD-10-CM | POA: Diagnosis not present

## 2018-03-19 DIAGNOSIS — G8929 Other chronic pain: Secondary | ICD-10-CM | POA: Diagnosis not present

## 2018-03-19 DIAGNOSIS — R2689 Other abnormalities of gait and mobility: Secondary | ICD-10-CM | POA: Diagnosis not present

## 2018-03-21 DIAGNOSIS — G8929 Other chronic pain: Secondary | ICD-10-CM | POA: Diagnosis not present

## 2018-03-21 DIAGNOSIS — R2689 Other abnormalities of gait and mobility: Secondary | ICD-10-CM | POA: Diagnosis not present

## 2018-03-24 DIAGNOSIS — R2689 Other abnormalities of gait and mobility: Secondary | ICD-10-CM | POA: Diagnosis not present

## 2018-03-24 DIAGNOSIS — G8929 Other chronic pain: Secondary | ICD-10-CM | POA: Diagnosis not present

## 2018-03-25 ENCOUNTER — Ambulatory Visit (INDEPENDENT_AMBULATORY_CARE_PROVIDER_SITE_OTHER): Payer: Medicare Other | Admitting: Internal Medicine

## 2018-03-25 ENCOUNTER — Encounter: Payer: Self-pay | Admitting: Internal Medicine

## 2018-03-25 VITALS — BP 112/68 | HR 88 | Temp 97.5°F | Ht 66.0 in | Wt 154.6 lb

## 2018-03-25 DIAGNOSIS — M5416 Radiculopathy, lumbar region: Secondary | ICD-10-CM

## 2018-03-25 DIAGNOSIS — R011 Cardiac murmur, unspecified: Secondary | ICD-10-CM | POA: Diagnosis not present

## 2018-03-25 DIAGNOSIS — M47816 Spondylosis without myelopathy or radiculopathy, lumbar region: Secondary | ICD-10-CM | POA: Diagnosis not present

## 2018-03-25 DIAGNOSIS — F411 Generalized anxiety disorder: Secondary | ICD-10-CM | POA: Diagnosis not present

## 2018-03-25 DIAGNOSIS — M810 Age-related osteoporosis without current pathological fracture: Secondary | ICD-10-CM

## 2018-03-25 DIAGNOSIS — F419 Anxiety disorder, unspecified: Secondary | ICD-10-CM | POA: Diagnosis not present

## 2018-03-25 DIAGNOSIS — M5136 Other intervertebral disc degeneration, lumbar region: Secondary | ICD-10-CM

## 2018-03-25 DIAGNOSIS — F339 Major depressive disorder, recurrent, unspecified: Secondary | ICD-10-CM | POA: Diagnosis not present

## 2018-03-25 DIAGNOSIS — I10 Essential (primary) hypertension: Secondary | ICD-10-CM | POA: Diagnosis not present

## 2018-03-25 DIAGNOSIS — M5432 Sciatica, left side: Secondary | ICD-10-CM

## 2018-03-25 DIAGNOSIS — F329 Major depressive disorder, single episode, unspecified: Secondary | ICD-10-CM

## 2018-03-25 MED ORDER — DIAZEPAM 2 MG PO TABS: 2.0000 mg | ORAL_TABLET | Freq: Once | ORAL | 0 refills | Status: AC

## 2018-03-25 MED ORDER — DULOXETINE HCL 20 MG PO CPEP: 20.0000 mg | ORAL_CAPSULE | Freq: Every day | ORAL | 2 refills | Status: DC

## 2018-03-25 MED ORDER — TRAMADOL HCL 50 MG PO TABS: 50.0000 mg | ORAL_TABLET | Freq: Two times a day (BID) | ORAL | 0 refills | Status: DC | PRN

## 2018-03-25 NOTE — Patient Instructions (Addendum)
Try Tylenol 500 mg up to 6 per day  Try Glucosamine chondroitin  I will order echo for to evaluate your heart    Heart Murmur A heart murmur is an extra sound that is caused by chaotic blood flow. The murmur can be heard as a "hum" or "whoosh" sound when blood flows through the heart. The heart has four areas called chambers. Valves separate the upper and lower chambers from each other (tricuspid valve and mitral valve) and separate the lower chambers of the heart from pathways that lead away from the heart (aortic valve and pulmonary valve). Normally, the valves open to let blood flow through or out of your heart, and then they shut to keep the blood from flowing backward. There are two types of heart murmurs:  Innocent murmurs. Most people with this type of heart murmur do not have a heart problem. Many children have innocent heart murmurs. Your health care provider may suggest some basic testing to find out whether your murmur is an innocent murmur. If an innocent heart murmur is found, there is no need for further tests or treatment and no need to restrict activities or stop playing sports.  Abnormal murmurs. These types of murmurs can occur in children and adults. Abnormal murmurs may be a sign of a more serious heart condition, such as a heart defect present at birth (congenital defect) or heart valve disease.  What are the causes? This condition is caused by heart valves that are not working properly. In children, abnormal heart murmurs are typically caused by congenital defects. In adults, abnormal murmurs are usually from heart valve problems caused by disease, infection, or aging. Three types of heart valve defects can cause a murmur:  Regurgitation. This is when blood leaks back through the valve in the wrong direction.  Mitral valve prolapse. This is when the mitral valve of the heart has a loose flap and does not close tightly.  Stenosis. This is when a valve does not open enough  and blocks blood flow.  This condition may also be caused by:  Pregnancy.  Fever.  Overactive thyroid gland.  Anemia.  Exercise.  Rapid growth spurts (in children).  What are the signs or symptoms? Innocent murmurs do not cause symptoms, and many people with abnormal murmurs may or may not have symptoms. If symptoms do develop, they may include:  Shortness of breath.  Blue coloring of the skin, especially on the fingertips.  Chest pain.  Palpitations, or feeling a fluttering or skipped heartbeat.  Fainting.  Persistent cough.  Getting tired much faster than expected.  Swelling in the abdomen, feet, or ankles.  How is this diagnosed? This condition may be diagnosed during a routine physical or other exam. If your health care provider hears a murmur with a stethoscope, he or she will listen for:  Where the murmur is located in your heart.  How long the murmur lasts (duration).  When the murmur is heard during the heartbeat.  How loud the murmur is. This may help the health care provider figure out what is causing the murmur.  You may be referred to a heart specialist (cardiologist). You may also have other tests, including:  Electrocardiogram (ECG or EKG). This test measures the electrical activity of your heart.  Echocardiogram. This test uses high frequency sound waves to make pictures of your heart.  MRI or chest X-ray.  Cardiac catheterization. This test looks at blood flow through the heart.  For children and adults who  have an abnormal heart murmur and want to stay active, it is important to complete testing, review test results, and receive recommendations from your health care provider. If heart disease is present, it may not be safe to play or be active. How is this treated? Heart murmurs themselves do not need treatment. In some cases, a heart murmur may go away on its own. If an underlying problem or disease is causing the murmur, you may need  treatment. If treatment is needed, it will depend on the type and severity of the disease or heart problem causing the murmur. Treatment may include:  Medicine.  Surgery.  Dietary and lifestyle changes.  Follow these instructions at home:  Talk with your health care provider before participating in sports or other activities that require a lot of effort and energy (are strenuous).  Learn as much as possible about your condition and any related diseases. Ask your health care provider if you may at risk for any medical emergencies.  Talk with your health care provider about what symptoms you should look out for.  It is up to you to get your test results. Ask your health care provider, or the department that is doing the test, when your results will be ready.  Keep all follow-up visits as told by your health care provider. This is important. Contact a health care provider if:  You feel light-headed.  You are frequently short of breath.  You feel more tired than usual.  You are having a hard time keeping up with normal activities or fitness routines.  You have swelling in your ankles or feet.  You have chest pain.  You notice that your heart often beats irregularly.  You develop any new symptoms. Get help right away if:  You develop severe chest pain.  You are having trouble breathing.  You have fainting spells.  Your symptoms suddenly get worse. These symptoms may represent a serious problem that is an emergency. Do not wait to see if the symptoms will go away. Get medical help right away. Call your local emergency services (911 in the [place] Do not drive yourself to the hospital. Summary  Normally, the heart valves open to let blood flow through or out of your heart, and then they shut to keep the blood from flowing backward.  Heart murmur is caused by heart valves that are not working properly.  You may need treatment if an underlying problem or disease is causing  the heart murmur. Treatment may include medicine, surgery, or dietary and lifestyle changes.  Talk with your health care provider before participating in sports or other activities that require a lot of effort and energy (are strenuous).  Talk with your health care provider about what symptoms you should watch out for. This information is not intended to replace advice given to you by your health care provider. Make sure you discuss any questions you have with your health care provider. Document Released: 11/29/2004 Document Revised: 10/10/2016 Document Reviewed: 10/10/2016 Elsevier Interactive Patient Education  2018 [employer].    Duloxetine delayed-release capsules What is this medicine? DULOXETINE (doo LOX e teen) is used to treat depression, anxiety, and different types of chronic pain. This medicine may be used for other purposes; ask your health care provider or pharmacist if you have questions. COMMON BRAND NAME(S): Cymbalta, [place]ren[place] What should I tell my health care provider before I take this medicine? They need to know if you have any of these conditions: -bipolar disorder or  a family history of bipolar disorder -glaucoma -kidney disease -liver disease -suicidal thoughts or a previous suicide attempt -taken medicines called MAOIs like Carbex, Eldepryl, Marplan, Nardil, and Parnate within 14 days -an unusual reaction to duloxetine, other medicines, foods, dyes, or preservatives -pregnant or trying to get pregnant -breast-feeding How should I use this medicine? Take this medicine by mouth with a glass of water. Follow the directions on the prescription label. Do not cut, crush or chew this medicine. You can take this medicine with or without food. Take your medicine at regular intervals. Do not take your medicine more often than directed. Do not stop taking this medicine suddenly except upon the advice of your doctor. Stopping this medicine too quickly may cause serious side  effects or your condition may worsen. A special MedGuide will be given to you by the pharmacist with each prescription and refill. Be sure to read this information carefully each time. Talk to your pediatrician regarding the use of this medicine in children. While this drug may be prescribed for children as young as 60 years of age for selected conditions, precautions do apply. Overdosage: If you think you have taken too much of this medicine contact a poison control center or emergency room at once. NOTE: This medicine is only for you. Do not share this medicine with others. What if I miss a dose? If you miss a dose, take it as soon as you can. If it is almost time for your next dose, take only that dose. Do not take double or extra doses. What may interact with this medicine? Do not take this medicine with any of the following medications: -desvenlafaxine -levomilnacipran -linezolid -MAOIs like Carbex, Eldepryl, Marplan, Nardil, and Parnate -methylene blue (injected into a vein) -milnacipran -thioridazine -venlafaxine This medicine may also interact with the following medications: -alcohol -amphetamines -aspirin and aspirin-like medicines -certain antibiotics like ciprofloxacin and enoxacin -certain medicines for blood pressure, heart disease, irregular heart beat -certain medicines for depression, anxiety, or psychotic disturbances -certain medicines for migraine headache like almotriptan, eletriptan, frovatriptan, naratriptan, rizatriptan, sumatriptan, zolmitriptan -certain medicines that treat or prevent blood clots like warfarin, enoxaparin, and dalteparin -cimetidine -fentanyl -lithium -NSAIDS, medicines for pain and inflammation, like ibuprofen or naproxen -phentermine -procarbazine -rasagiline -sibutramine -St. John's wort -theophylline -tramadol -tryptophan This list may not describe all possible interactions. Give your health care provider a list of all the medicines,  herbs, non-prescription drugs, or dietary supplements you use. Also tell them if you smoke, drink alcohol, or use illegal drugs. Some items may interact with your medicine. What should I watch for while using this medicine? Tell your doctor if your symptoms do not get better or if they get worse. Visit your doctor or health care professional for regular checks on your progress. Because it may take several weeks to see the full effects of this medicine, it is important to continue your treatment as prescribed by your doctor. Patients and their families should watch out for new or worsening thoughts of suicide or depression. Also watch out for sudden changes in feelings such as feeling anxious, agitated, panicky, irritable, hostile, aggressive, impulsive, severely restless, overly excited and hyperactive, or not being able to sleep. If this happens, especially at the beginning of treatment or after a change in dose, call your health care professional. Dennis Bast may get drowsy or dizzy. Do not drive, use machinery, or do anything that needs mental alertness until you know how this medicine affects you. Do not stand or sit up  quickly, especially if you are an older patient. This reduces the risk of dizzy or fainting spells. Alcohol may interfere with the effect of this medicine. Avoid alcoholic drinks. This medicine can cause an increase in blood pressure. This medicine can also cause a sudden drop in your blood pressure, which may make you feel faint and increase the chance of a fall. These effects are most common when you first start the medicine or when the dose is increased, or during use of other medicines that can cause a sudden drop in blood pressure. Check with your doctor for instructions on monitoring your blood pressure while taking this medicine. Your mouth may get dry. Chewing sugarless gum or sucking hard candy, and drinking plenty of water may help. Contact your doctor if the problem does not go away or  is severe. What side effects may I notice from receiving this medicine? Side effects that you should report to your doctor or health care professional as soon as possible: -allergic reactions like skin rash, itching or hives, swelling of the face, lips, or tongue -anxious -breathing problems -confusion -changes in vision -chest pain -confusion -elevated mood, decreased need for sleep, racing thoughts, impulsive behavior -eye pain -fast, irregular heartbeat -feeling faint or lightheaded, falls -feeling agitated, angry, or irritable -hallucination, loss of contact with reality -high blood pressure -loss of balance or coordination -palpitations -redness, blistering, peeling or loosening of the skin, including inside the mouth -restlessness, pacing, inability to keep still -seizures -stiff muscles -suicidal thoughts or other mood changes -trouble passing urine or change in the amount of urine -trouble sleeping -unusual bleeding or bruising -unusually weak or tired -vomiting -yellowing of the eyes or skin Side effects that usually do not require medical attention (report to your doctor or health care professional if they continue or are bothersome): -change in sex drive or performance -change in appetite or weight -constipation -dizziness -dry mouth -headache -increased sweating -nausea -tired This list may not describe all possible side effects. Call your doctor for medical advice about side effects. You may report side effects to FDA at 1-800-FDA-1088. Where should I keep my medicine? Keep out of the reach of children. Store at room temperature between 20 and 25 degrees C (68 to 77 degrees F). Throw away any unused medicine after the expiration date. NOTE: This sheet is a summary. It may not cover all possible information. If you have questions about this medicine, talk to your doctor, pharmacist, or health care provider.  2018 Elsevier/Gold Standard (2016-03-22  18:16:03)   Back Pain, Adult Many adults have back pain from time to time. Common causes of back pain include:  A strained muscle or ligament.  Wear and tear (degeneration) of the spinal disks.  Arthritis.  A hit to the back.  Back pain can be short-lived (acute) or last a long time (chronic). A physical exam, lab tests, and imaging studies may be done to find the cause of your pain. Follow these instructions at home: Managing pain and stiffness  Take over-the-counter and prescription medicines only as told by your health care provider.  If directed, apply heat to the affected area as often as told by your health care provider. Use the heat source that your health care provider recommends, such as a moist heat pack or a heating pad. ? Place a towel between your skin and the heat source. ? Leave the heat on for 20-30 minutes. ? Remove the heat if your skin turns bright red. This is especially important  if you are unable to feel pain, heat, or cold. You have a greater risk of getting burned.  If directed, apply ice to the injured area: ? Put ice in a plastic bag. ? Place a towel between your skin and the bag. ? Leave the ice on for 20 minutes, 2-3 times a day for the first 2-3 days. Activity  Do not stay in bed. Resting more than 1-2 days can delay your recovery.  Take short walks on even surfaces as soon as you are able. Try to increase the length of time you walk each day.  Do not sit, drive, or stand in one place for more than 30 minutes at a time. Sitting or standing for long periods of time can put stress on your back.  Use proper lifting techniques. When you bend and lift, use positions that put less stress on your back: ? [place] your knees. ? Keep the load close to your body. ? Avoid twisting.  Exercise regularly as told by your health care provider. Exercising will help your back heal faster. This also helps prevent back injuries by keeping muscles strong and  flexible.  Your health care provider may recommend that you see a physical therapist. This person can help you come up with a safe exercise program. Do any exercises as told by your physical therapist. Lifestyle  Maintain a healthy weight. Extra weight puts stress on your back and makes it difficult to have good posture.  Avoid activities or situations that make you feel anxious or stressed. Learn ways to manage anxiety and stress. One way to manage stress is through exercise. Stress and anxiety increase muscle tension and can make back pain worse. General instructions  Sleep on a firm mattress in a comfortable position. Try lying on your side with your knees slightly bent. If you lie on your back, put a pillow under your knees.  Follow your treatment plan as told by your health care provider. This may include: ? Cognitive or behavioral therapy. ? Acupuncture or massage therapy. ? Meditation or yoga. Contact a health care provider if:  You have pain that is not relieved with rest or medicine.  You have increasing pain going down into your legs or buttocks.  Your pain does not improve in 2 weeks.  You have pain at night.  You lose weight.  You have a fever or chills. Get help right away if:  You develop new bowel or bladder control problems.  You have unusual weakness or numbness in your arms or legs.  You develop nausea or vomiting.  You develop abdominal pain.  You feel faint. Summary  Many adults have back pain from time to time. A physical exam, lab tests, and imaging studies may be done to find the cause of your pain.  Use proper lifting techniques. When you bend and lift, use positions that put less stress on your back.  Take over-the-counter and prescription medicines and apply heat or ice as directed by your health care provider. This information is not intended to replace advice given to you by your health care provider. Make sure you discuss any questions you  have with your health care provider. Document Released: 02-11-202006 Document Revised: 11/26/2016 Document Reviewed: 11/26/2016 Elsevier Interactive Patient Education  2018 [employer].   Sciatica Sciatica is pain, numbness, weakness, or tingling along the path of the sciatic nerve. The sciatic nerve starts in the lower back and runs down the back of each leg. The nerve controls  the muscles in the lower leg and in the back of the knee. It also provides feeling (sensation) to the back of the thigh, the lower leg, and the sole of the foot. Sciatica is a symptom of another medical condition that pinches or puts pressure on the sciatic nerve. Generally, sciatica only affects one side of the body. Sciatica usually goes away on its own or with treatment. In some cases, sciatica may keep coming back (recur). What are the causes? This condition is caused by pressure on the sciatic nerve, or pinching of the sciatic nerve. This may be the result of:  A disk in between the bones of the spine (vertebrae) bulging out too far (herniated disk).  Age-related changes in the spinal disks (degenerative disk disease).  A pain disorder that affects a muscle in the buttock (piriformis syndrome).  Extra bone growth (bone spur) near the sciatic nerve.  An injury or break (fracture) of the pelvis.  Pregnancy.  Tumor (rare).  What increases the risk? The following factors may make you more likely to develop this condition:  Playing sports that place pressure or stress on the spine, such as football or weight lifting.  Having poor strength and flexibility.  A history of back injury.  A history of back surgery.  Sitting for long periods of time.  Doing activities that involve repetitive bending or lifting.  Obesity.  What are the signs or symptoms? Symptoms can vary from mild to very severe, and they may include:  Any of these problems in the lower back, leg, hip, or buttock: ? Mild tingling or  dull aches. ? Burning sensations. ? Sharp pains.  Numbness in the back of the calf or the sole of the foot.  Leg weakness.  Severe back pain that makes movement difficult.  These symptoms may get worse when you cough, sneeze, or laugh, or when you sit or stand for long periods of time. Being overweight may also make symptoms worse. In some cases, symptoms may recur over time. How is this diagnosed? This condition may be diagnosed based on:  Your symptoms.  A physical exam. Your health care provider may ask you to do certain movements to check whether those movements trigger your symptoms.  You may have tests, including: ? Blood tests. ? X-rays. ? MRI. ? CT scan.  How is this treated? In many cases, this condition improves on its own, without any treatment. However, treatment may include:  Reducing or modifying physical activity during periods of pain.  Exercising and stretching to strengthen your abdomen and improve the flexibility of your spine.  Icing and applying heat to the affected area.  Medicines that help: ? To relieve pain and swelling. ? To relax your muscles.  Injections of medicines that help to relieve pain, irritation, and inflammation around the sciatic nerve (steroids).  Surgery.  Follow these instructions at home: Medicines  Take over-the-counter and prescription medicines only as told by your health care provider.  Do not drive or operate heavy machinery while taking prescription pain medicine. Managing pain  If directed, apply ice to the affected area. ? Put ice in a plastic bag. ? Place a towel between your skin and the bag. ? Leave the ice on for 20 minutes, 2-3 times a day.  After icing, apply heat to the affected area before you exercise or as often as told by your health care provider. Use the heat source that your health care provider recommends, such as a moist heat pack  or a heating pad. ? Place a towel between your skin and the heat  source. ? Leave the heat on for 20-30 minutes. ? Remove the heat if your skin turns bright red. This is especially important if you are unable to feel pain, heat, or cold. You may have a greater risk of getting burned. Activity  Return to your normal activities as told by your health care provider. Ask your health care provider what activities are safe for you. ? Avoid activities that make your symptoms worse.  Take brief periods of rest throughout the day. Resting in a lying or standing position is usually better than sitting to rest. ? When you rest for longer periods, mix in some mild activity or stretching between periods of rest. This will help to prevent stiffness and pain. ? Avoid sitting for long periods of time without moving. Get up and move around at least one time each hour.  Exercise and stretch regularly, as told by your health care provider.  Do not lift anything that is heavier than 10 lb (4.5 kg) while you have symptoms of sciatica. When you do not have symptoms, you should still avoid heavy lifting, especially repetitive heavy lifting.  When you lift objects, always use proper lifting technique, which includes: ? Bending your knees. ? Keeping the load close to your body. ? Avoiding twisting. General instructions  Use good posture. ? Avoid leaning forward while sitting. ? Avoid hunching over while standing.  Maintain a healthy weight. Excess weight puts extra stress on your back and makes it difficult to maintain good posture.  Wear supportive, comfortable shoes. Avoid wearing high heels.  Avoid sleeping on a mattress that is too soft or too hard. A mattress that is firm enough to support your back when you sleep may help to reduce your pain.  Keep all follow-up visits as told by your health care provider. This is important. Contact a health care provider if:  You have pain that wakes you up when you are sleeping.  You have pain that gets worse when you lie  down.  Your pain is worse than you have experienced in the past.  Your pain lasts longer than 4 weeks.  You experience unexplained weight loss. Get help right away if:  You lose control of your bowel or bladder (incontinence).  You have: ? Weakness in your lower back, pelvis, buttocks, or legs that gets worse. ? Redness or swelling of your back. ? A burning sensation when you urinate. This information is not intended to replace advice given to you by your health care provider. Make sure you discuss any questions you have with your health care provider. Document Released: 10/16/2001 Document Revised: 03/27/2016 Document Reviewed: 07/01/2015 Elsevier Interactive Patient Education  [employer].

## 2018-03-25 NOTE — Progress Notes (Signed)
Pre visit review using our clinic review tool, if applicable. No additional management support is needed unless otherwise documented below in the visit note. 

## 2018-03-25 NOTE — Progress Notes (Signed)
Chief Complaint  Patient presents with  . Sciatica    left side   Follow up  1. Sciatica pt thinks and she thinks she was misdiagnosed in the past. Going to PT currently.  C/o left lower back and hip pain, chronic Xrays 02/2018 with deg changes b/l hips, osteopenia and deg changes low back and anterolisthesis L4/5 previous CT ab/pelvis with severe OA low back reviewed 2014  She had a flare of her back this weekend and left hip she tried stretching but it made it worse pain 10/10. She could not move her left left and pain is worse with walking at times but now improved.  Pain was worse than childbirth. Her daughter gave her 4 Ibuprofen otc x 1 time   2. C/o depression due to death of husband 4 years ago and anniversary is upcoming and she has been on buspar, lexapro, zoloft in the past and has not tolerated but wants to try something else.   3. Osteopenia noted on Xrays with h/o osteoporosis   4. HTN BP controlled on micardis 40 mg qd     Review of Systems  Constitutional: Negative for weight loss.  HENT: Negative for hearing loss.   Eyes: Negative for blurred vision.  Respiratory: Negative for shortness of breath.   Cardiovascular: Negative for chest pain.  Musculoskeletal: Positive for back pain and joint pain.  Skin: Negative for rash.  Neurological: Negative for headaches.  Psychiatric/Behavioral: Positive for depression.   Past Medical History:  Diagnosis Date  . Arthritis   . Depression    since death of husband in 53   . Diverticulosis   . Essential hypertension   . Gallstones   . Gastritis and duodenitis    dounf by CT Nov 2012  . Hyperlipemia   . Pneumonia    hx of   Past Surgical History:  Procedure Laterality Date  . ABDOMINAL HYSTERECTOMY    . ACROMIO-CLAVICULAR JOINT REPAIR Right 07/04/2015   Procedure: ACROMIO-CLAVICULAR JOINT REPAIR;  Surgeon: Tania Ade, MD;  Location: [facility];  Service: Orthopedics;  Laterality: Right;  Right open  reduction internal fixation clavical with allograft, coracoclaviular reconstruction  . APPENDECTOMY    . CHOLECYSTECTOMY  2012  . CHOLECYSTECTOMY, LAPAROSCOPIC  08/06/2011   gallstones  . COLONOSCOPY  02/18/2006   2 mm rectal polyp, diverticulosis  . ORIF CLAVICULAR FRACTURE Right 07/04/2015   Procedure: OPEN REDUCTION INTERNAL FIXATION (ORIF) CLAVICULAR FRACTURE;  Surgeon: Tania Ade, MD;  Location: [facility];  Service: Orthopedics;  Laterality: Right;  . TONSILECTOMY, ADENOIDECTOMY, BILATERAL MYRINGOTOMY AND TUBES     as a child, does not remember date   Family History  Problem Relation Age of Onset  . Stroke Mother   . Prostate cancer Father   . Lung cancer Brother   . Prostate cancer Brother   . Colon cancer Neg Hx    Social History   Socioeconomic History  . Marital status: Married    Spouse name: Not on file  . Number of children: Not on file  . Years of education: Not on file  . Highest education level: Not on file  Occupational History  . 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 Financial resource strain: Not hard at all  . Food insecurity:    Worry: Never true    Inability: Never true  . Transportation needs:    Medical: No    Non-medical: No  Tobacco Use  . Smoking  status: Never Smoker  . Smokeless tobacco: Never Used  Substance and Sexual Activity  . Alcohol use: No    Comment: rare  . Drug use: No  . Sexual activity: Never  Lifestyle  . Physical activity:    Days per week: Not on file    Minutes per session: Not on file  . Stress: Not on file  Relationships  . Social connections:    Talks on phone: Not on file    Gets together: Not on file    Attends religious service: Not on file    Active member of club or organization: Not on file    Attends meetings of clubs or organizations: Not on file    Relationship status: Not on file  . Intimate partner violence:    Fear of current or ex partner: No    Emotionally  abused: No    Physically abused: No    Forced sexual activity: No  Other Topics Concern  . Not on file  Social History Narrative   Has children    Widower    [place]    Current Meds  Medication Sig  . telmisartan (MICARDIS) 40 MG tablet Take 1 tablet (40 mg total) by mouth daily.   Allergies  Allergen Reactions  . Lexapro [Escitalopram Oxalate] Itching   Recent Results (from the past 2160 hour(s))  Microalbumin / creatinine urine ratio     Status: Abnormal   Collection Time: 02/17/18  4:55 PM  Result Value Ref Range   Microalb, Ur 5.1 (H) 0.0 - 1.9 mg/dL   Creatinine,U 66.4 mg/dL   Microalb Creat Ratio 7.7 0.0 - 30.0 mg/g  Comprehensive metabolic panel     Status: None   Collection Time: 02/17/18  4:55 PM  Result Value Ref Range   Sodium 140 135 - 145 mEq/L   Potassium 4.4 3.5 - 5.1 mEq/L   Chloride 102 96 - 112 mEq/L   CO2 29 19 - 32 mEq/L   Glucose, Bld 74 70 - 99 mg/dL   BUN 17 6 - 23 mg/dL   Creatinine, Ser 0.66 0.40 - 1.20 mg/dL   Total Bilirubin 0.4 0.2 - 1.2 mg/dL   Alkaline Phosphatase 80 39 - 117 U/L   AST 18 0 - 37 U/L   ALT 12 0 - 35 U/L   Total Protein 7.3 6.0 - 8.3 g/dL   Albumin 4.3 3.5 - 5.2 g/dL   Calcium 9.8 8.4 - 10.5 mg/dL   GFR 91.39 >60.00 mL/min  Lipid panel     Status: Abnormal   Collection Time: 02/17/18  4:55 PM  Result Value Ref Range   Cholesterol 199 0 - 200 mg/dL    Comment: ATP III Classification       Desirable:  < 200 mg/dL               Borderline High:  200 - 239 mg/dL          High:  > = 240 mg/dL   Triglycerides 114.0 0.0 - 149.0 mg/dL    Comment: Normal:  <150 mg/dLBorderline High:  150 - 199 mg/dL   HDL 70.10 >39.00 mg/dL   VLDL 22.8 0.0 - 40.0 mg/dL   LDL Cholesterol 106 (H) 0 - 99 mg/dL   Total CHOL/HDL Ratio 3     Comment:                Men          Women1/2 Average Risk  3.4          3.3Average Risk          5.0          4.42X Average Risk          9.6          7.13X Average Risk          15.0          11.0                        NonHDL 129.01     Comment: NOTE:  Non-HDL goal should be 30 mg/dL higher than patient's LDL goal (i.e. LDL goal of < 70 mg/dL, would have non-HDL goal of < 100 mg/dL)  TSH     Status: None   Collection Time: 02/17/18  4:55 PM  Result Value Ref Range   TSH 2.40 0.35 - 4.50 uIU/mL  VITAMIN D 25 Hydroxy (Vit-D Deficiency, Fractures)     Status: None   Collection Time: 02/17/18  4:55 PM  Result Value Ref Range   VITD 41.96 30.00 - 100.00 ng/mL  Basic metabolic panel     Status: None   Collection Time: 02/24/18  2:58 PM  Result Value Ref Range   Sodium 139 135 - 145 mEq/L   Potassium 4.3 3.5 - 5.1 mEq/L   Chloride 101 96 - 112 mEq/L   CO2 29 19 - 32 mEq/L   Glucose, Bld 73 70 - 99 mg/dL   BUN 19 6 - 23 mg/dL   Creatinine, Ser 0.89 0.40 - 1.20 mg/dL   Calcium 9.4 8.4 - 10.5 mg/dL   GFR 64.72 >60.00 mL/min   Objective  Body mass index is 24.95 kg/m. Wt Readings from Last 3 Encounters:  03/25/18 154 lb 9.6 oz (70.1 kg)  02/12/18 154 lb (69.9 kg)  06/17/17 153 lb (69.4 kg)   Temp Readings from Last 3 Encounters:  03/25/18 (!) 97.5 F (36.4 C) (Oral)  02/17/18 98.1 F (36.7 C) (Oral)  02/12/18 98.3 F (36.8 C) (Oral)   BP Readings from Last 3 Encounters:  03/25/18 112/68  02/17/18 (!) 162/68  02/12/18 (!) 148/62   Pulse Readings from Last 3 Encounters:  03/25/18 88  02/17/18 67  02/12/18 76    Physical Exam  Constitutional: She is oriented to person, place, and time. Vital signs are normal. She appears well-developed and well-nourished. She is cooperative.  HENT:  Head: Normocephalic and atraumatic.  Mouth/Throat: Oropharynx is clear and moist and mucous membranes are normal.  Eyes: Pupils are equal, round, and reactive to light. Conjunctivae are normal.  Cardiovascular: Normal rate and regular rhythm.  Murmur heard. Pulmonary/Chest: Effort normal and breath sounds normal.  Musculoskeletal: She exhibits tenderness.       Left hip: She exhibits  tenderness.       Lumbar back: She exhibits tenderness.       Back:  Neurological: She is alert and oriented to person, place, and time. Gait normal.  Skin: Skin is warm, dry and intact.  Psychiatric: She has a normal mood and affect. Her speech is normal and behavior is normal. Judgment and thought content normal.  Nursing note and vitals reviewed.   Assessment   1.lumbar radiculopathy vs sciatica vs OA lumbar  2. Depression with h/o anxiety  3. Osteopenia/porosis DEXA 5.9/18 +osteoporosis  4. HTN  5. Cardiac murmur  6. HM Plan  1. Open MRI in [place]  Trial of  cymbalta 20 mg qd  Prn tramadol, tylenol, in PT currently  Rx valium prn prior to MRI  Disc tumeric vs glucosamine chondroitin  2. Trial of cymbalta Declines therapy for now  3.  Consider prolia in future  Check vitamin D level  Needs calcium  4. Cont meds  5. Will order echo  6.  utd flu, prevnar, pna 23, Tdap zoster  Disc shingrix in future  mammo 12/11/17 neg  Pap out of age window  dexa 03/2017 +osteoporosis  Colonoscopy 02/2006 polyps diverticulosis  Provider: Dr. Olivia Mackie McLean-Scocuzza-Internal Medicine

## 2018-03-28 ENCOUNTER — Other Ambulatory Visit: Payer: Self-pay | Admitting: Internal Medicine

## 2018-03-28 DIAGNOSIS — F329 Major depressive disorder, single episode, unspecified: Secondary | ICD-10-CM

## 2018-03-28 DIAGNOSIS — F419 Anxiety disorder, unspecified: Secondary | ICD-10-CM

## 2018-03-28 DIAGNOSIS — M5416 Radiculopathy, lumbar region: Secondary | ICD-10-CM

## 2018-03-28 DIAGNOSIS — I10 Essential (primary) hypertension: Secondary | ICD-10-CM

## 2018-03-28 MED ORDER — DULOXETINE HCL 20 MG PO CPEP: 20.0000 mg | ORAL_CAPSULE | Freq: Every day | ORAL | 0 refills | Status: DC

## 2018-03-28 MED ORDER — TELMISARTAN 40 MG PO TABS: 40.0000 mg | ORAL_TABLET | Freq: Every day | ORAL | 3 refills | Status: DC

## 2018-03-30 ENCOUNTER — Ambulatory Visit
Admission: RE | Admit: 2018-03-30 | Discharge: 2018-03-30 | Disposition: A | Discharge status: Home / Self Care | Payer: Medicare Other | Source: Ambulatory Visit | Attending: Internal Medicine | Admitting: Internal Medicine

## 2018-03-30 DIAGNOSIS — M5416 Radiculopathy, lumbar region: Secondary | ICD-10-CM

## 2018-03-30 DIAGNOSIS — M5136 Other intervertebral disc degeneration, lumbar region: Secondary | ICD-10-CM

## 2018-03-30 DIAGNOSIS — M47816 Spondylosis without myelopathy or radiculopathy, lumbar region: Secondary | ICD-10-CM

## 2018-03-30 DIAGNOSIS — M48061 Spinal stenosis, lumbar region without neurogenic claudication: Secondary | ICD-10-CM | POA: Diagnosis not present

## 2018-04-03 ENCOUNTER — Telehealth: Payer: Self-pay | Admitting: Internal Medicine

## 2018-04-03 NOTE — Telephone Encounter (Signed)
Fax MRI and order for PT to [facility] dept  Thanks [employer]

## 2018-04-04 ENCOUNTER — Encounter: Payer: Self-pay | Admitting: Internal Medicine

## 2018-04-09 ENCOUNTER — Telehealth: Payer: Self-pay

## 2018-04-09 NOTE — Telephone Encounter (Signed)
Copied from [place] [id number]. Topic: Inquiry >> Apr 09, 2018  3:55 PM Nils Flack wrote: Reason for CRM: pt returning rasheeda's call  [phone] Pt has NOT had an echo  Please call pt back

## 2018-04-10 DIAGNOSIS — M5416 Radiculopathy, lumbar region: Secondary | ICD-10-CM | POA: Diagnosis not present

## 2018-04-15 DIAGNOSIS — M5416 Radiculopathy, lumbar region: Secondary | ICD-10-CM | POA: Diagnosis not present

## 2018-04-16 ENCOUNTER — Encounter: Payer: Self-pay | Admitting: Internal Medicine

## 2018-04-16 ENCOUNTER — Telehealth: Payer: Self-pay

## 2018-04-16 NOTE — Telephone Encounter (Signed)
Copied from [place] [id number]. Topic: Inquiry >> Apr 09, 2018  3:55 PM Nils Flack wrote: Reason for CRM: pt returning rasheeda's call  [phone] Pt has NOT had an echo  Please call pt back   >> Apr 16, 2018 10:08 AM Percell Belt A wrote: Pt called in and stated that, ok to sch Eco anytime.  She is ok with anytime it needs to be sch'd.  She will make it work

## 2018-04-17 ENCOUNTER — Ambulatory Visit: Payer: Medicare Other | Admitting: Internal Medicine

## 2018-04-23 ENCOUNTER — Ambulatory Visit
Admission: RE | Admit: 2018-04-23 | Discharge: 2018-04-23 | Disposition: A | Discharge status: Home / Self Care | Payer: Medicare Other | Source: Ambulatory Visit | Attending: Internal Medicine | Admitting: Internal Medicine

## 2018-04-23 ENCOUNTER — Encounter: Payer: Self-pay | Admitting: Internal Medicine

## 2018-04-23 DIAGNOSIS — E785 Hyperlipidemia, unspecified: Secondary | ICD-10-CM | POA: Insufficient documentation

## 2018-04-23 DIAGNOSIS — I083 Combined rheumatic disorders of mitral, aortic and tricuspid valves: Secondary | ICD-10-CM | POA: Insufficient documentation

## 2018-04-23 DIAGNOSIS — R011 Cardiac murmur, unspecified: Secondary | ICD-10-CM

## 2018-04-23 NOTE — Progress Notes (Signed)
*  PRELIMINARY RESULTS* Echocardiogram 2D Echocardiogram has been performed.  Elaine Price 04/23/2018, 11:56 AM

## 2018-05-07 ENCOUNTER — Encounter: Payer: Self-pay | Admitting: *Deleted

## 2018-05-14 DIAGNOSIS — H35033 Hypertensive retinopathy, bilateral: Secondary | ICD-10-CM | POA: Diagnosis not present

## 2018-05-14 DIAGNOSIS — H25013 Cortical age-related cataract, bilateral: Secondary | ICD-10-CM | POA: Diagnosis not present

## 2018-05-14 DIAGNOSIS — I10 Essential (primary) hypertension: Secondary | ICD-10-CM | POA: Diagnosis not present

## 2018-05-14 DIAGNOSIS — H2513 Age-related nuclear cataract, bilateral: Secondary | ICD-10-CM | POA: Diagnosis not present

## 2018-05-14 DIAGNOSIS — H524 Presbyopia: Secondary | ICD-10-CM | POA: Diagnosis not present

## 2018-06-04 ENCOUNTER — Telehealth: Payer: Self-pay

## 2018-06-04 NOTE — Telephone Encounter (Signed)
Copied from [place] [id number]. Topic: General - Other >> Jun 04, 2018  3:03 PM Mcneil, Ja-Kwan wrote: Reason for CRM: Pt states she lives [place] and lots of people have shingles there. Pt requests shingle vaccination. Pt request call back. Cb# [phone]

## 2018-06-04 NOTE — Telephone Encounter (Signed)
Given rx shingrix x 2 doses  [place]

## 2018-08-05 DIAGNOSIS — Z23 Encounter for immunization: Secondary | ICD-10-CM | POA: Diagnosis not present

## 2018-12-28 HISTORY — PX: BREAST BIOPSY: SHX20

## 2019-02-18 ENCOUNTER — Ambulatory Visit: Payer: Medicare Other | Admitting: Internal Medicine

## 2019-03-03 ENCOUNTER — Other Ambulatory Visit: Payer: Self-pay

## 2019-03-03 ENCOUNTER — Ambulatory Visit (INDEPENDENT_AMBULATORY_CARE_PROVIDER_SITE_OTHER): Payer: Medicare Other | Admitting: Internal Medicine

## 2019-03-03 ENCOUNTER — Encounter: Payer: Self-pay | Admitting: Internal Medicine

## 2019-03-03 DIAGNOSIS — I10 Essential (primary) hypertension: Secondary | ICD-10-CM

## 2019-03-03 DIAGNOSIS — Z1389 Encounter for screening for other disorder: Secondary | ICD-10-CM

## 2019-03-03 DIAGNOSIS — M810 Age-related osteoporosis without current pathological fracture: Secondary | ICD-10-CM | POA: Diagnosis not present

## 2019-03-03 DIAGNOSIS — Z1231 Encounter for screening mammogram for malignant neoplasm of breast: Secondary | ICD-10-CM

## 2019-03-03 DIAGNOSIS — R011 Cardiac murmur, unspecified: Secondary | ICD-10-CM

## 2019-03-03 DIAGNOSIS — R7303 Prediabetes: Secondary | ICD-10-CM

## 2019-03-03 MED ORDER — TELMISARTAN 40 MG PO TABS: 40.0000 mg | ORAL_TABLET | Freq: Every day | ORAL | 3 refills | Status: DC

## 2019-03-03 NOTE — Progress Notes (Addendum)
Telephone Note  I connected with Elaine Price   on 03/03/19 at  8:40 AM EDT by telephone and verified that I am speaking with the correct person using two identifiers.  Location patient: home Location provider:work Persons participating in the virtual visit: patient, provider  I discussed the limitations of evaluation and management by telemedicine and the availability of in person appointments. The patient expressed understanding and agreed to proceed.   HPI: 1. HTN BP controlled 127/65 2. Doing well weight is 142 lbs  3. Wants to repeat DEXA for osteoporosis when COVID 19 over given # to call [facility] to schedule DEXA and mammo    ROS: See pertinent positives and negatives per HPI. Denies low back pain   Past Medical History:  Diagnosis Date  . Arthritis   . Depression    since death of husband in 35   . Diverticulosis   . Essential hypertension   . Gallstones   . Gastritis and duodenitis    dounf by CT Nov 2012  . Hyperlipemia   . Pneumonia    hx of    Past Surgical History:  Procedure Laterality Date  . ABDOMINAL HYSTERECTOMY    . ACROMIO-CLAVICULAR JOINT REPAIR Right 07/04/2015   Procedure: ACROMIO-CLAVICULAR JOINT REPAIR;  Surgeon: Tania Ade, MD;  Location: [facility];  Service: Orthopedics;  Laterality: Right;  Right open reduction internal fixation clavical with allograft, coracoclaviular reconstruction  . APPENDECTOMY    . CHOLECYSTECTOMY  2012  . CHOLECYSTECTOMY, LAPAROSCOPIC  08/06/2011   gallstones  . COLONOSCOPY  02/18/2006   2 mm rectal polyp, diverticulosis  . ORIF CLAVICULAR FRACTURE Right 07/04/2015   Procedure: OPEN REDUCTION INTERNAL FIXATION (ORIF) CLAVICULAR FRACTURE;  Surgeon: Tania Ade, MD;  Location: [facility];  Service: Orthopedics;  Laterality: Right;  . TONSILECTOMY, ADENOIDECTOMY, BILATERAL MYRINGOTOMY AND TUBES     as a child, does not remember date    Family History  Problem Relation Age of  Onset  . Stroke Mother   . Prostate cancer Father   . Lung cancer Brother   . Prostate cancer Brother   . Colon cancer Neg Hx     SOCIAL HX: [facility] with dog at [facility] Widowed    Current Outpatient Medications:  .  telmisartan (MICARDIS) 40 MG tablet, Take 1 tablet (40 mg total) by mouth daily., Disp: 90 tablet, Rfl: 3  EXAM:  VITALS per patient if applicable:  GENERAL: alert, oriented, appears well and in no acute distress  PSYCH/NEURO: pleasant and cooperative, no obvious depression or anxiety, speech and thought processing grossly intact  ASSESSMENT AND PLAN:  Discussed the following assessment and plan:  Essential hypertension - Plan: Comprehensive metabolic panel, CBC w/Diff, Lipid panel, TSH, Urinalysis, Routine w reflex microscopic, telmisartan (MICARDIS) 40 MG tablet qd no refill needed at this time  -sch fasting labs when covid 19 over   Prediabetes - Plan: Hemoglobin A1c  HM Pt to call and sch fasting labs when covid 19 is resolved orders in   utd flu 2019 at [facility], prevnar, pna 23, Tdap zoster  Disc shingrix in future  mammo 12/11/17 neg wants to order one at [facility] and will call to schedule  Pap out of age window  dexa 03/2017 +osteoporosis reordered [facility]  Colonoscopy 02/2006 polyps diverticulosis    Seen 06/09/19 Dr. 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 Barry Dienes CCS   H/o Dr. Mike Gip   I discussed the assessment and treatment plan with the patient. The patient was provided an opportunity to ask questions and all were answered. The patient agreed with the plan and demonstrated an understanding of the instructions.   The patient was advised to call back or seek an  in-person evaluation if the symptoms worsen or if the condition fails to improve as anticipated.  Time spent 15 minutes  Delorise Jackson, MD

## 2019-03-04 ENCOUNTER — Encounter: Payer: Self-pay | Admitting: Internal Medicine

## 2019-04-07 DIAGNOSIS — L500 Allergic urticaria: Secondary | ICD-10-CM | POA: Diagnosis not present

## 2019-04-07 DIAGNOSIS — L309 Dermatitis, unspecified: Secondary | ICD-10-CM | POA: Diagnosis not present

## 2019-04-07 DIAGNOSIS — L814 Other melanin hyperpigmentation: Secondary | ICD-10-CM | POA: Diagnosis not present

## 2019-04-07 DIAGNOSIS — L570 Actinic keratosis: Secondary | ICD-10-CM | POA: Diagnosis not present

## 2019-04-07 DIAGNOSIS — L821 Other seborrheic keratosis: Secondary | ICD-10-CM | POA: Diagnosis not present

## 2019-05-12 ENCOUNTER — Ambulatory Visit
Admission: RE | Admit: 2019-05-12 | Discharge: 2019-05-12 | Disposition: A | Discharge status: Home / Self Care | Payer: Medicare Other | Source: Ambulatory Visit | Attending: Internal Medicine | Admitting: Internal Medicine

## 2019-05-12 ENCOUNTER — Other Ambulatory Visit: Payer: Self-pay

## 2019-05-12 ENCOUNTER — Other Ambulatory Visit: Payer: Self-pay | Admitting: Internal Medicine

## 2019-05-12 DIAGNOSIS — R928 Other abnormal and inconclusive findings on diagnostic imaging of breast: Secondary | ICD-10-CM

## 2019-05-12 DIAGNOSIS — M810 Age-related osteoporosis without current pathological fracture: Secondary | ICD-10-CM

## 2019-05-12 DIAGNOSIS — Z1231 Encounter for screening mammogram for malignant neoplasm of breast: Secondary | ICD-10-CM | POA: Diagnosis not present

## 2019-05-12 DIAGNOSIS — Z780 Asymptomatic menopausal state: Secondary | ICD-10-CM | POA: Diagnosis not present

## 2019-05-12 DIAGNOSIS — R921 Mammographic calcification found on diagnostic imaging of breast: Secondary | ICD-10-CM

## 2019-05-13 ENCOUNTER — Telehealth: Payer: Self-pay | Admitting: *Deleted

## 2019-05-13 NOTE — Telephone Encounter (Signed)
-----   Message from Delorise Jackson, MD sent at 05/12/2019  4:57 PM EDT ----- Please get prolia approved   thanks Tobias

## 2019-05-13 NOTE — Telephone Encounter (Signed)
Insurance verification for Prolia filed on [employer] Portal. 

## 2019-05-18 ENCOUNTER — Other Ambulatory Visit: Payer: Self-pay | Admitting: Internal Medicine

## 2019-05-18 ENCOUNTER — Telehealth: Payer: Self-pay | Admitting: Internal Medicine

## 2019-05-18 DIAGNOSIS — I10 Essential (primary) hypertension: Secondary | ICD-10-CM

## 2019-05-18 MED ORDER — TELMISARTAN 40 MG PO TABS: 40.0000 mg | ORAL_TABLET | Freq: Every day | ORAL | 3 refills | Status: DC

## 2019-05-18 NOTE — Telephone Encounter (Signed)
Medication Refill - Medication: telmisartan (MICARDIS) 40 MG tablet  Has the patient contacted their pharmacy? yes (Agent: If no, request that the patient contact the pharmacy for the refill.) (Agent: If yes, when and what did the pharmacy advise?)  Preferred Pharmacy (with phone number or street name):  [facility] [place], [place] [phone] (Phone) [phone] (Fax)         Agent: Please be advised that RX refills may take up to 3 business days. We ask that you follow-up with your pharmacy.

## 2019-05-18 NOTE — Telephone Encounter (Signed)
Do you want this Rx refilled?    Patient has not scheduled an appt yet to get future labs that you ordered on 03/03/19.   Last OV:  03/03/19  Last Labs:  02/24/18

## 2019-05-21 ENCOUNTER — Ambulatory Visit
Admission: RE | Admit: 2019-05-21 | Discharge: 2019-05-21 | Disposition: A | Discharge status: Home / Self Care | Payer: Medicare Other | Source: Ambulatory Visit | Attending: Internal Medicine | Admitting: Internal Medicine

## 2019-05-21 ENCOUNTER — Other Ambulatory Visit: Payer: Self-pay

## 2019-05-21 DIAGNOSIS — R921 Mammographic calcification found on diagnostic imaging of breast: Secondary | ICD-10-CM | POA: Diagnosis not present

## 2019-05-21 DIAGNOSIS — R922 Inconclusive mammogram: Secondary | ICD-10-CM | POA: Diagnosis not present

## 2019-05-21 DIAGNOSIS — R928 Other abnormal and inconclusive findings on diagnostic imaging of breast: Secondary | ICD-10-CM

## 2019-05-21 DIAGNOSIS — N6321 Unspecified lump in the left breast, upper outer quadrant: Secondary | ICD-10-CM | POA: Diagnosis not present

## 2019-05-22 ENCOUNTER — Other Ambulatory Visit: Payer: Self-pay | Admitting: Internal Medicine

## 2019-05-22 DIAGNOSIS — R928 Other abnormal and inconclusive findings on diagnostic imaging of breast: Secondary | ICD-10-CM

## 2019-05-22 DIAGNOSIS — N6320 Unspecified lump in the left breast, unspecified quadrant: Secondary | ICD-10-CM

## 2019-05-22 DIAGNOSIS — R921 Mammographic calcification found on diagnostic imaging of breast: Secondary | ICD-10-CM

## 2019-05-26 NOTE — Telephone Encounter (Signed)
I was able to get Prolia approved but patient has refused at this time to have injection , patient said she may reconsider at a Later date.

## 2019-05-26 NOTE — Telephone Encounter (Signed)
Noted  

## 2019-05-28 ENCOUNTER — Other Ambulatory Visit: Payer: Self-pay

## 2019-05-28 ENCOUNTER — Ambulatory Visit
Admission: RE | Admit: 2019-05-28 | Discharge: 2019-05-28 | Disposition: A | Discharge status: Home / Self Care | Payer: Medicare Other | Source: Ambulatory Visit | Attending: Internal Medicine | Admitting: Internal Medicine

## 2019-05-28 ENCOUNTER — Other Ambulatory Visit: Payer: Self-pay | Admitting: Internal Medicine

## 2019-05-28 DIAGNOSIS — R928 Other abnormal and inconclusive findings on diagnostic imaging of breast: Secondary | ICD-10-CM

## 2019-05-28 DIAGNOSIS — N6489 Other specified disorders of breast: Secondary | ICD-10-CM

## 2019-05-28 DIAGNOSIS — N6320 Unspecified lump in the left breast, unspecified quadrant: Secondary | ICD-10-CM | POA: Insufficient documentation

## 2019-05-28 DIAGNOSIS — N6091 Unspecified benign mammary dysplasia of right breast: Secondary | ICD-10-CM | POA: Diagnosis not present

## 2019-05-28 DIAGNOSIS — R921 Mammographic calcification found on diagnostic imaging of breast: Secondary | ICD-10-CM

## 2019-05-28 DIAGNOSIS — C50912 Malignant neoplasm of unspecified site of left female breast: Secondary | ICD-10-CM | POA: Insufficient documentation

## 2019-05-28 DIAGNOSIS — C50412 Malignant neoplasm of upper-outer quadrant of left female breast: Secondary | ICD-10-CM | POA: Diagnosis not present

## 2019-05-28 HISTORY — PX: BREAST BIOPSY: SHX20

## 2019-05-29 ENCOUNTER — Other Ambulatory Visit: Payer: Self-pay | Admitting: Internal Medicine

## 2019-05-29 ENCOUNTER — Encounter: Payer: Self-pay | Admitting: Internal Medicine

## 2019-05-29 DIAGNOSIS — C50912 Malignant neoplasm of unspecified site of left female breast: Secondary | ICD-10-CM

## 2019-06-01 ENCOUNTER — Other Ambulatory Visit: Payer: Self-pay | Admitting: Anatomic Pathology & Clinical Pathology

## 2019-06-02 LAB — SURGICAL PATHOLOGY

## 2019-06-03 ENCOUNTER — Telehealth: Payer: Self-pay

## 2019-06-03 NOTE — Progress Notes (Signed)
  Oncology Nurse Navigator Documentation  Navigator Location: [facility] (06/03/19 1000) Referral Date to RadOnc/MedOnc: 06/05/19 (06/03/19 1000) )Navigator Encounter Type: Introductory Phone Call (06/03/19 1000)   Abnormal Finding Date: 05/21/19 (06/03/19 1000) Confirmed Diagnosis Date: 05/28/19 (06/03/19 1000)                 Treatment Phase: Pre-Tx/Tx Discussion (06/03/19 1000) Barriers/Navigation Needs: Coordination of Care;Education (06/03/19 1000) Education: Accessing Care/ Finding Providers;Coping with Diagnosis/ Prognosis;Newly Diagnosed Cancer Education (06/03/19 1000) Interventions: Coordination of Care;Education;Psycho-Social Support (06/03/19 1000)                      Time Spent with Patient: 30 (06/03/19 1000)  Phoned patient to introduce navigation service.  She has an appointment scheduled with Dr. Mike Gip 06/05/19 and she is waiting for appointment with Dr. Barry Dienes.  Phoned Sarah in  referral service at [facility] to let them know.  Will deliver Breast Cancer education/ hospital services Literature to [facility] prior to patient appointment.

## 2019-06-03 NOTE — Progress Notes (Signed)
[facility]  [address] [place], [place] [address] Phone: [phone]  Fax: [phone]   Clinic Day:  06/05/2019  Referring physician: McLean-Scocuzza, Olivia Mackie *  Chief Complaint: Elaine Price is a 84 y.o. female with left breast cancer who is referred in consultation with Dr McLean-Scocuzza for assessment and management.   HPI: The patient undergoes yearly screening mammograms (last 12/11/2017). Bilateral screening mammogram on 05/12/2019 revealed calcifications in the right breast and possible architectural distortion in the left breast. The patient did not palpate a lump prior to diagnosis. She has not had any previous breast biopsies.   Bilateral diagnostic mammogram on 05/21/2019 revealed persistent architectural distortion with an associated mass in the far posterior upper outer left breast. There were calcifications spanning 8 x 9 mm in the central right breast of indeterminate morphology. Ultrasound revealed an 8 x 7 x 7 mm irregular hypoechoic mass with posterior acoustic shadowing at the 1 o'clock position 9 cm from the nipple in the left breast. There was no left axillary adenopathy.   Right breast biopsy on 05/28/2019 revealed fibroadenomatous changes with stromal hyalinization and calcifications. There was focal atypical ductal hyperplasia. Left breast biopsy revealed a 6 mm grade I invasive mammary carcinoma. Tumor was ER + >90%, PR + >90%, HER-2/neu - (1+).   Bone density scan on 05/12/2019 revealed osteoporosis with a T-score of -3.4 at the forearm radius. Patient declined Prolia.   She has a family history of ovarian cancer in her grandmother. Menarche was at age 84. She has 3 children, and attempted breastfeeding but notes her "nipples don't pop out like they're supposed to" so she stopped. She was on birth control for about one year. Menopause began at about 84yo. She previously took hormone replacement for 6 months. She had a total  hysterectomy at about age 70. She does not perform monthly breast exams.   Symptomatically, she is feels "fine." She denies any fevers, sweats, or weight loss. She denies any hot flashes or night sweats. She denies any chest pain, shortness of breath, or cough. She denies any urinary symptoms, nausea, vomiting, or diarrhea. She denies any bone or joint pain.  She has an appointment in surgery with Dr. Barry Dienes on 06/15/2019.    Past Medical History:  Diagnosis Date   Arthritis    Depression    since death of husband in January 10, 2014    Diverticulosis    Essential hypertension    Gallstones    Gastritis and duodenitis    dounf by CT Nov 2012   Hyperlipemia    Pneumonia    hx of    Past Surgical History:  Procedure Laterality Date   ABDOMINAL HYSTERECTOMY     ACROMIO-CLAVICULAR JOINT REPAIR Right 07/04/2015   Procedure: ACROMIO-CLAVICULAR JOINT REPAIR;  Surgeon: Tania Ade, MD;  Location: [facility];  Service: Orthopedics;  Laterality: Right;  Right open reduction internal fixation clavical with allograft, coracoclaviular reconstruction   APPENDECTOMY     BREAST BIOPSY Right 12/28/2018   affirm bx-"x" clip-path pending   BREAST BIOPSY Left 05/28/2019   Affirm Biopsy- Coil clip- path pending   CHOLECYSTECTOMY  2011-01-11   CHOLECYSTECTOMY, LAPAROSCOPIC  08/06/2011   gallstones   COLONOSCOPY  02/18/2006   2 mm rectal polyp, diverticulosis   ORIF CLAVICULAR FRACTURE Right 07/04/2015   Procedure: OPEN REDUCTION INTERNAL FIXATION (ORIF) CLAVICULAR FRACTURE;  Surgeon: Tania Ade, MD;  Location: [facility];  Service: Orthopedics;  Laterality: Right;   TONSILECTOMY, ADENOIDECTOMY,  BILATERAL MYRINGOTOMY AND TUBES     as a child, does not remember date    Family History  Problem Relation Age of Onset   Stroke Mother    Heart attack Mother    Prostate cancer Father    Lung cancer Brother    Prostate cancer Brother    Hypertension  Brother    Colon cancer Neg Hx     Social History:  reports that she has never smoked. She has never used smokeless tobacco. She reports that she does not drink alcohol or use drugs.She denies any alcohol or tobacco use. She denies any known exposure to radiation or toxins. 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 She is originally from the [place], but lived in [place] for 16 years. She has lived in [place] for 26-27 years. She lives in [place] at the [facility]. Her husband died of cancer. She has 3 children, one who lives in [place], one in [place], and one in [place]. She has 7 grandchildren. The patient is alone today.  Allergies:  Allergies  Allergen Reactions   Lexapro [Escitalopram Oxalate] Itching    Current Medications: Current Outpatient Medications  Medication Sig Dispense Refill   telmisartan (MICARDIS) 40 MG tablet Take 1 tablet (40 mg total) by mouth daily. 90 tablet 3   No current facility-administered medications for this visit.     Review of Systems  Constitutional: Negative.  Negative for chills, diaphoresis, fever, malaise/fatigue and weight loss.       Feels "fine."  HENT: Negative.  Negative for congestion, hearing loss, sinus pain and sore throat.   Eyes: Negative.  Negative for blurred vision.  Respiratory: Negative.  Negative for cough, shortness of breath and wheezing.   Cardiovascular: Negative.  Negative for chest pain, palpitations, orthopnea, leg swelling and PND.  Gastrointestinal: Negative.  Negative for abdominal pain, blood in stool, constipation, diarrhea, melena, nausea and vomiting.  Genitourinary: Negative.  Negative for dysuria, frequency, hematuria and urgency.  Musculoskeletal: Negative.  Negative for back pain, joint pain and myalgias.  Skin: Negative.  Negative for rash.  Neurological: Negative.  Negative for dizziness, tingling, sensory change, weakness and  headaches.  Endo/Heme/Allergies: Negative.  Does not bruise/bleed easily.  Psychiatric/Behavioral: Negative.  Negative for depression, memory loss and substance abuse. The patient is not nervous/anxious and does not have insomnia.   All other systems reviewed and are negative.  Performance status (ECOG): 0  Vitals Blood pressure (!) 150/64, pulse 89, temperature (!) 97.4 F (36.3 C), temperature source Tympanic, resp. rate 16, height '5\' 6"'  (1.676 m), weight 147 lb 7.8 oz (66.9 kg), SpO2 100 %.   Physical Exam  Constitutional: She is oriented to person, place, and time. She appears well-developed and well-nourished. No distress.  HENT:  Head: Normocephalic and atraumatic.  Mouth/Throat: Oropharynx is clear and moist. No oropharyngeal exudate.  Short white hair. Mask.  Eyes: Pupils are equal, round, and reactive to light. Conjunctivae and EOM are normal. No scleral icterus.  Glasses.   Neck: Normal range of motion. Neck supple. No JVD present.  Cardiovascular: Normal rate, regular rhythm and normal heart sounds.  No murmur heard. Pulmonary/Chest: Effort normal and breath sounds normal. No respiratory distress. She has no wheezes. She has no rales. Right breast exhibits no inverted nipple, no mass, no nipple discharge, no skin change and no tenderness. Left breast exhibits no inverted nipple, no mass, no nipple discharge, no skin change  and no tenderness. Breasts are symmetrical (bilateral fibrocystic changes; s/p bilateral biopsy).  Abdominal: Soft. Bowel sounds are normal. She exhibits no distension. There is no abdominal tenderness. There is no rebound and no guarding.  Musculoskeletal: Normal range of motion.        General: No edema.  Lymphadenopathy:    She has no cervical adenopathy.    She has no axillary adenopathy.       Right: No supraclavicular adenopathy present.       Left: No supraclavicular adenopathy present.  Neurological: She is alert and oriented to person, place, and  time.  Skin: Skin is warm and dry. No rash noted. She is not diaphoretic. No erythema.  Psychiatric: She has a normal mood and affect. Her behavior is normal. Judgment and thought content normal.  Nursing note and vitals reviewed.   No visits with results within 3 Day(s) from this visit.  Latest known visit with results is:  Hospital Outpatient Visit on 05/28/2019  Component Date Value Ref Range Status   SURGICAL PATHOLOGY 05/28/2019    Final-Edited                   Value:Surgical Pathology THIS IS AN ADDENDUM REPORT CASE: [id number] PATIENT: [facility] Surgical Pathology Report Addendum  Reason for Addendum #1:  Breast Biomarker Results  SPECIMEN SUBMITTED: A. Breast, right, upper central; biopsy B. Breast, left, upper outer quadrant; biopsy  CLINICAL HISTORY: 1.  Indeterminate right breast calcifications, 8 x 9 mm.  2.  Suspicious mass/distortion in left breast 8 x 7 x 7 mm  PRE-OPERATIVE DIAGNOSIS: 1.  FA vs DCIS, favor FA. 2.  CA vs CSL  POST-OPERATIVE DIAGNOSIS: Post biopsy mammograms show X-shaped clip at site of calcifications upper central right breast. The coil shaped clip is appropriately positioned 4 mm from the inferior border of the targeted mass/distortion UOQ left breast.  DIAGNOSIS: A. BREAST, RIGHT, UPPER CENTRAL; STEREOTACTIC-GUIDED CORE BIOPSY: - FIBROADENOMATOUS CHANGES WITH STROMAL HYALINIZATION AND CALCIFICATIONS. - FOCAL ATYPICAL DUCTAL HYPERPLASIA.  Commen                         t: Additional deeper sections will be reviewed on block A2 for documentation of calcifications. Any contributory findings will be reported in an addendum.  B. BREAST, LEFT, UPPER OUTER QUADRANT; STEREOTACTIC-GUIDED CORE BIOPSY: - INVASIVE MAMMARY CARCINOMA, NO SPECIAL TYPE.  Size of invasive carcinoma: 6 mm in this sample Histologic grade of invasive carcinoma: Grade 1                      Glandular/tubular differentiation score: 1                       Nuclear pleomorphism score: 2                      Mitotic rate score: 1                      Total score: 4 Ductal carcinoma in situ: Not definitively identified but difficult to exclude because the invasive component has areas with cribriform features Lymphovascular invasion: Not identified  ER/PR/HER2: Immunohistochemistry will be performed on block B3, with reflex to [place] for HER2 2+. The results will be reported in an addendum.  Comment: The definitive grade will be assigned on the excisional specimen. These  findings were communicated to Stacie Acres, RN, in Dr. Reynolds Bowl office on 05/29/2019 via [place] [facility] [place].  GROSS DESCRIPTION: A. Labeled: #1 right breast stereo biopsy calcification upper central Received: in a formalin-filled Bre[place]a collection device Accompanying specimen radiograph: Yes, marked C, E, F, K Time/Date in fixative: Collection at 8:45 AM, put in formalin 8:50 AM 05/28/2019 Cold ischemic time: 5 minutes Total fixation time: 12 hours Core pieces: Multiple Measurement: 0.2 cm in width each and ranging from 0.5-1.5 cm in length Description / comments: Mostly fatty soft tissue cores Inked: Blue Entirely submitted in 6 cassette(s): A1- section C A2- section E A3- section F A4- section K A5-A6-entirely submitted breast cores  B. Labeled: Left breast stereo biopsy distortion #2 Received: in a formalin-filled Brevera collection device Accompanying specimen radiograph: No Time / date in fixative: Collection at 9:20 AM, put in formalin at 9:25 AM 05/28/2019                          Cold ischemic time: 5 minutes Total fixation time: 11 hours Core pieces: Multiple Measurement: 0.2 cm in width each and ranging from 0.5 to 1.5 cm in length Description / comments: Multiparity soft tissue cores Inked: Black Entirely submitted in 4 cassettes   Final Diagnosis performed by Bryan Lemma, MD.   Electronically signed 05/29/2019 5:30:46PM The  electronic signature indicates that the named Attending Pathologist has evaluated the specimen  Technical component performed at [facility], [address] [place], [place] [address] Lab: [phone] Dir: Rush Farmer, MD, MMM  Professional component performed at [facility], [facility], [place], [place], [place] [address] Lab: [phone] Dir: Dellia Nims. Rubinas, MD  ADDENDUM: BREAST BIOMARKER TESTS Estrogen Receptor (ER) Status: POSITIVE                      Percentage of cells with nuclear positivity: >90%                      Average intensity of staining: Strong  Progesterone Receptor (PgR                         ) Status: POSITIVE                      Percentage of cells with nuclear positivity: >90%                      Average intensity of staining: Strong  HER2 (by immunohistochemistry): NEGATIVE (Score 1)  Cold Ischemia and Fixation Times: Meet requirements specified in latest version of the ASCO/CAP guidelines Testing Performed on Block Number(s): B3  METHODS Fixative: Formalin Estrogen Receptor:  FDA cleared ([place]) Primary Antibody:  SP1 Progesterone Receptor: FDA cleared ([place]) Primary Antibody: 1E2 HER2 (by IHC): FDA cleared ([place]) Primary Antibody: 4B5 ([facility]) Immunohistochemistry controls worked appropriately. Slides were prepared by [facility], [place], [place], and interpreted by Dr. Dicie Beam.  This test was developed and its performance characteristics determined by [facility]. It has not been cleared or approved by the [place] Food and Drug Administration. The FDA does not require this test to go through premarket FDA review. This test is used for clini                         cal purposes. It should not be regarded as investigational or for research. This  laboratory is certified under the Clinical Laboratory Improvement Amendments (CLIA) as qualified to perform high complexity clinical laboratory testing.    Addendum #1  performed by Bryan Lemma, MD.   Electronically signed 06/02/2019 4:41:35PM The electronic signature indicates that the named Attending Pathologist has evaluated the specimen  Technical component performed at [facility], [address], [place], [place] [address] Lab: [phone] Dir: Rush Farmer, MD, MMM  Professional component performed at [facility], [facility], [place], [place], [place] [address] Lab: [phone] Dir: Dellia Nims. Reuel Derby, MD     Assessment:  Elaine Price is a 84 y.o. female with clinical stage I left breast cancer s/p biopsy on 05/28/2019.  Pathology revealed a 6 mm grade I invasive mammary carcinoma.  Tumor was ER + >90%, PR + >90%, HER-2/neu - (1+).   Bilateral diagnostic mammogram on 05/21/2019 revealed persistent architectural distortion with an associated mass in the far posterior upper outer left breast. Ultrasound revealed an 8 x 7 x 7 mm irregular hypoechoic mass with posterior acoustic shadowing at the 1 o'clock position 9 cm from the nipple in the left breast. There was no left axillary adenopathy.   Bilateral diagnostic mammogram on 05/21/2019 revealed calcifications spanning 8 x 9 mm in the central right breast of indeterminate morphology.  Right breast biopsy on 05/28/2019 revealed fibroadenomatous changes with stromal hyalinization and calcifications. There was focal atypical ductal hyperplasia.   Bone density on 05/12/2019 revealed osteoporosis with a T-score of -3.4 at the forearm radius.   Symptomatically, she feels well.  Exam reveals bilateral fibrocystic changes and recent biopsy changes.  Plan: 1.   Labs today: CBC with diff, CMP, CA27.29.  2.   Left breast cancer  Discuss surgical resection (lumpectomy vs mastectomy).   Given small size, anticipate lumpectomy and SLN biopsy  Discuss radiation if lumpectomy pursued.  Discuss plan for adjuvant endocrine therapy.   Information provided on tamoxifen and an aromatase inhibitor  (letrozole). 3.   Atypical ductal hyperplasia (ADH)  Discuss plan for excisional biopsy of ADH  Risk of upgrade in diagnosis to DCIS or invasive disease 10-30%.  Risk reduction strategies discussed 4.   Osteoporosis  Discuss calcium and vitamin D.  Discuss bisphosphonates vs RANK-ligand (Prolia) 5.   RTC 2 weeks after surgery.   I discussed the assessment and treatment plan with the patient.  The patient was provided an opportunity to ask questions and all were answered.  The patient agreed with the plan and demonstrated an understanding of the instructions.  The patient was advised to call back if the symptoms worsen or if the condition fails to improve as anticipated.  I provided 40 minutes of face-to-face time during this this encounter and > 50% was spent counseling as documented under my assessment and plan.    Merdith Boyd C. Mike Gip, MD, PhD    06/05/2019, 8:51 AM  I, Cloyde Reams Dorshimer, am acting as Education administrator for [employer]. Mike Gip, MD, PhD.  I, Ardath Lepak C. Mike Gip, MD, have reviewed the above documentation for accuracy and completeness, and I agree with the above.

## 2019-06-03 NOTE — Telephone Encounter (Signed)
Copied from [place] [id number]. Topic: Referral - Question >> Jun 03, 2019 11:25 AM Rayann Heman wrote: Reason for CRM: courtney calling with the cancer center states they received referral with no note. Please send notes  [phone]

## 2019-06-04 ENCOUNTER — Other Ambulatory Visit: Payer: Self-pay | Admitting: Internal Medicine

## 2019-06-04 ENCOUNTER — Other Ambulatory Visit: Payer: Self-pay

## 2019-06-04 DIAGNOSIS — N6091 Unspecified benign mammary dysplasia of right breast: Secondary | ICD-10-CM | POA: Insufficient documentation

## 2019-06-04 DIAGNOSIS — I10 Essential (primary) hypertension: Secondary | ICD-10-CM

## 2019-06-04 MED ORDER — TELMISARTAN 40 MG PO TABS: 40.0000 mg | ORAL_TABLET | Freq: Every day | ORAL | 3 refills | Status: DC

## 2019-06-04 NOTE — Telephone Encounter (Signed)
Elaine Price called in from [facility] stating she is needing some clarification in regards to notes and referral. Please advise and call back.

## 2019-06-04 NOTE — Telephone Encounter (Signed)
The referral was sent through Epic and they are part of [facility] they should have the note.

## 2019-06-05 ENCOUNTER — Encounter: Payer: Self-pay | Admitting: Hematology and Oncology

## 2019-06-05 ENCOUNTER — Inpatient Hospital Stay: Payer: Medicare Other | Attending: Hematology and Oncology | Admitting: Hematology and Oncology

## 2019-06-05 VITALS — BP 150/64 | HR 89 | Temp 97.4°F | Resp 16 | Ht 66.0 in | Wt 147.5 lb

## 2019-06-05 DIAGNOSIS — N6091 Unspecified benign mammary dysplasia of right breast: Secondary | ICD-10-CM | POA: Diagnosis not present

## 2019-06-05 DIAGNOSIS — M810 Age-related osteoporosis without current pathological fracture: Secondary | ICD-10-CM | POA: Insufficient documentation

## 2019-06-05 DIAGNOSIS — Z8042 Family history of malignant neoplasm of prostate: Secondary | ICD-10-CM

## 2019-06-05 DIAGNOSIS — C50412 Malignant neoplasm of upper-outer quadrant of left female breast: Secondary | ICD-10-CM | POA: Diagnosis not present

## 2019-06-05 DIAGNOSIS — Z8041 Family history of malignant neoplasm of ovary: Secondary | ICD-10-CM | POA: Diagnosis not present

## 2019-06-05 DIAGNOSIS — Z801 Family history of malignant neoplasm of trachea, bronchus and lung: Secondary | ICD-10-CM

## 2019-06-05 DIAGNOSIS — Z170 Estrogen receptor positive status [ER+]: Secondary | ICD-10-CM

## 2019-06-05 LAB — COMPREHENSIVE METABOLIC PANEL
ALT: 15 U/L (ref 0–44)
AST: 18 U/L (ref 15–41)
Albumin: 4.2 g/dL (ref 3.5–5.0)
Alkaline Phosphatase: 75 U/L (ref 38–126)
Anion gap: 8 (ref 5–15)
BUN: 21 mg/dL (ref 8–23)
CO2: 27 mmol/L (ref 22–32)
Calcium: 8.9 mg/dL (ref 8.9–10.3)
Chloride: 102 mmol/L (ref 98–111)
Creatinine, Ser: 0.69 mg/dL (ref 0.44–1.00)
GFR calc Af Amer: >60 mL/min (ref >60)
GFR calc non Af Amer: >60 mL/min (ref >60)
Glucose, Bld: 133 mg/dL — ABNORMAL HIGH (ref 70–99)
Potassium: 4.2 mmol/L (ref 3.5–5.1)
Sodium: 137 mmol/L (ref 135–145)
Total Bilirubin: 0.6 mg/dL (ref 0.3–1.2)
Total Protein: 7.1 g/dL (ref 6.5–8.1)

## 2019-06-05 LAB — CBC WITH DIFFERENTIAL/PLATELET
Abs Immature Granulocytes: 0.02 10*3/uL (ref 0.00–0.07)
Basophils Absolute: 0 10*3/uL (ref 0.0–0.1)
Basophils Relative: 1 %
Eosinophils Absolute: 0 10*3/uL (ref 0.0–0.5)
Eosinophils Relative: 0 %
HCT: 39.7 % (ref 36.0–46.0)
Hemoglobin: 12.8 g/dL (ref 12.0–15.0)
Immature Granulocytes: 0 %
Lymphocytes Relative: 18 %
Lymphs Abs: 1 10*3/uL (ref 0.7–4.0)
MCH: 30.4 pg (ref 26.0–34.0)
MCHC: 32.2 g/dL (ref 30.0–36.0)
MCV: 94.3 fL (ref 80.0–100.0)
Monocytes Absolute: 0.4 10*3/uL (ref 0.1–1.0)
Monocytes Relative: 7 %
Neutro Abs: 4.3 10*3/uL (ref 1.7–7.7)
Neutrophils Relative %: 74 %
Platelets: 259 10*3/uL (ref 150–400)
RBC: 4.21 MIL/uL (ref 3.87–5.11)
RDW: 13.2 % (ref 11.5–15.5)
WBC: 5.7 10*3/uL (ref 4.0–10.5)
nRBC: 0 % (ref 0.0–0.2)

## 2019-06-05 NOTE — Progress Notes (Signed)
Dr. Mclean-Scocuzza for left breast malignancy. Denies any concerns.

## 2019-06-05 NOTE — Patient Instructions (Signed)
Tamoxifen oral tablet What is this medicine? TAMOXIFEN (ta MOX i fen) blocks the effects of estrogen. It is commonly used to treat breast cancer. It is also used to decrease the chance of breast cancer coming back in women who have received treatment for the disease. It may also help prevent breast cancer in women who have a high risk of developing breast cancer. This medicine may be used for other purposes; ask your health care provider or pharmacist if you have questions. COMMON BRAND NAME(S): Nolvadex What should I tell my health care provider before I take this medicine? They need to know if you have any of these conditions:  blood clots  blood disease  cataracts or impaired eyesight  endometriosis  high calcium levels  high cholesterol  irregular menstrual cycles  liver disease  stroke  uterine fibroids  an unusual reaction to tamoxifen, other medicines, foods, dyes, or preservatives  pregnant or trying to get pregnant  breast-feeding How should I use this medicine? Take this medicine by mouth with a glass of water. Follow the directions on the prescription label. You can take it with or without food. Take your medicine at regular intervals. Do not take your medicine more often than directed. Do not stop taking except on your doctor's advice. A special MedGuide will be given to you by the pharmacist with each prescription and refill. Be sure to read this information carefully each time. Talk to your pediatrician regarding the use of this medicine in children. While this drug may be prescribed for selected conditions, precautions do apply. Overdosage: If you think you have taken too much of this medicine contact a poison control center or emergency room at once. NOTE: This medicine is only for you. Do not share this medicine with others. What if I miss a dose? If you miss a dose, take it as soon as you can. If it is almost time for your next dose, take only that dose. Do  not take double or extra doses. What may interact with this medicine? Do not take this medicine with any of the following medications:  cisapride  certain medicines for irregular heart beat like dronedarone, quinidine  certain medicines for fungal infection like fluconazole, posaconazole  pimozide  saquinavir  thioridazine This medicine may also interact with the following medications:  aminoglutethimide  anastrozole  bromocriptine  chemotherapy drugs  dofetilide  female hormones, like estrogens and birth control pills  letrozole  medroxyprogesterone  phenobarbital  rifampin  warfarin This list may not describe all possible interactions. Give your health care provider a list of all the medicines, herbs, non-prescription drugs, or dietary supplements you use. Also tell them if you smoke, drink alcohol, or use illegal drugs. Some items may interact with your medicine. What should I watch for while using this medicine? Visit your doctor or health care professional for regular checks on your progress. You will need regular pelvic exams, breast exams, and mammograms. If you are taking this medicine to reduce your risk of getting breast cancer, you should know that this medicine does not prevent all types of breast cancer. If breast cancer or other problems occur, there is no guarantee that it will be found at an early stage. Do not become pregnant while taking this medicine or for 2 months after stopping it. Women should inform their doctor if they wish to become pregnant or think they might be pregnant. There is a potential for serious side effects to an unborn child. Talk to your   health care professional or pharmacist for more information. Do not breast-feed an infant while taking this medicine or for 3 months after stopping it. This medicine may interfere with the ability to have a child. Talk with your doctor or health care professional if you are concerned about your  fertility. What side effects may I notice from receiving this medicine? Side effects that you should report to your doctor or health care professional as soon as possible:  allergic reactions like skin rash, itching or hives, swelling of the face, lips, or tongue  changes in vision  changes in your menstrual cycle  difficulty walking or talking  new breast lumps  numbness  pelvic pain or pressure  redness, blistering, peeling or loosening of the skin, including inside the mouth  signs and symptoms of a dangerous change in heartbeat or heart rhythm like chest pain, dizziness, fast or irregular heartbeat, palpitations, feeling faint or lightheaded, falls, breathing problems  sudden chest pain  swelling, pain or tenderness in your calf or leg  unusual bruising or bleeding  vaginal discharge that is bloody, brown, or rust  weakness  yellowing of the whites of the eyes or skin Side effects that usually do not require medical attention (report to your doctor or health care professional if they continue or are bothersome):  fatigue  hair loss, although uncommon and is usually mild  headache  hot flashes  impotence (in men)  nausea, vomiting (mild)  vaginal discharge (white or clear) This list may not describe all possible side effects. Call your doctor for medical advice about side effects. You may report side effects to FDA at 1-800-FDA-1088. Where should I keep my medicine? Keep out of the reach of children. Store at room temperature between 20 and 25 degrees C (68 and 77 degrees F). Protect from light. Keep container tightly closed. Throw away any unused medicine after the expiration date. NOTE: This sheet is a summary. It may not cover all possible information. If you have questions about this medicine, talk to your doctor, pharmacist, or health care provider.  2020 Elsevier/Gold Standard (2018-10-14 11:15:31)   Letrozole tablets What is this  medicine? LETROZOLE (LET roe zole) blocks the production of estrogen. It is used to treat breast cancer. This medicine may be used for other purposes; ask your health care provider or pharmacist if you have questions. COMMON BRAND NAME(S): Femara What should I tell my health care provider before I take this medicine? They need to know if you have any of these conditions:  high cholesterol  liver disease  osteoporosis (weak bones)  an unusual or allergic reaction to letrozole, other medicines, foods, dyes, or preservatives  pregnant or trying to get pregnant  breast-feeding How should I use this medicine? Take this medicine by mouth with a glass of water. You may take it with or without food. Follow the directions on the prescription label. Take your medicine at regular intervals. Do not take your medicine more often than directed. Do not stop taking except on your doctor's advice. Talk to your pediatrician regarding the use of this medicine in children. Special care may be needed. Overdosage: If you think you have taken too much of this medicine contact a poison control center or emergency room at once. NOTE: This medicine is only for you. Do not share this medicine with others. What if I miss a dose? If you miss a dose, take it as soon as you can. If it is almost time for your next   dose, take only that dose. Do not take double or extra doses. What may interact with this medicine? Do not take this medicine with any of the following medications:  estrogens, like hormone replacement therapy or birth control pills This medicine may also interact with the following medications:  dietary supplements such as androstenedione or DHEA  prasterone  tamoxifen This list may not describe all possible interactions. Give your health care provider a list of all the medicines, herbs, non-prescription drugs, or dietary supplements you use. Also tell them if you smoke, drink alcohol, or use illegal  drugs. Some items may interact with your medicine. What should I watch for while using this medicine? Tell your doctor or healthcare professional if your symptoms do not start to get better or if they get worse. Do not become pregnant while taking this medicine or for 3 weeks after stopping it. Women should inform their doctor if they wish to become pregnant or think they might be pregnant. There is a potential for serious side effects to an unborn child. Talk to your health care professional or pharmacist for more information. Do not breast-feed while taking this medicine or for 3 weeks after stopping it. This medicine may interfere with the ability to have a child. Talk with your doctor or health care professional if you are concerned about your fertility. Using this medicine for a long time may increase your risk of low bone mass. Talk to your doctor about bone health. You may get drowsy or dizzy. Do not drive, use machinery, or do anything that needs mental alertness until you know how this medicine affects you. Do not stand or sit up quickly, especially if you are an older patient. This reduces the risk of dizzy or fainting spells. You may need blood work done while you are taking this medicine. What side effects may I notice from receiving this medicine? Side effects that you should report to your doctor or health care professional as soon as possible:  allergic reactions like skin rash, itching, or hives  bone fracture  chest pain  signs and symptoms of a blood clot such as breathing problems; changes in vision; chest pain; severe, sudden headache; pain, swelling, warmth in the leg; trouble speaking; sudden numbness or weakness of the face, arm or leg  vaginal bleeding Side effects that usually do not require medical attention (report to your doctor or health care professional if they continue or are bothersome):  bone, back, joint, or muscle pain  dizziness  fatigue  fluid  retention  headache  hot flashes, night sweats  nausea  weight gain This list may not describe all possible side effects. Call your doctor for medical advice about side effects. You may report side effects to FDA at 1-800-FDA-1088. Where should I keep my medicine? Keep out of the reach of children. Store between 15 and 30 degrees C (59 and 86 degrees F). Throw away any unused medicine after the expiration date. NOTE: This sheet is a summary. It may not cover all possible information. If you have questions about this medicine, talk to your doctor, pharmacist, or health care provider.  2020 Elsevier/Gold Standard (2016-05-28 11:10:41)  

## 2019-06-06 LAB — CANCER ANTIGEN 27.29: CA 27.29: 20.7 U/mL (ref 0.0–38.6)

## 2019-06-07 ENCOUNTER — Other Ambulatory Visit: Payer: Self-pay

## 2019-06-07 NOTE — Progress Notes (Signed)
  Oncology Nurse Navigator Documentation  Navigator Location: [facility] (06/07/19 1300)   )    Abnormal Finding Date: 06/07/19 (06/07/19 1300)                 Patient Visit Type: Follow-up (06/07/19 1300) Treatment Phase: Pre-Tx/Tx Discussion (06/07/19 1300)                            Time Spent with Patient: 15 (06/07/19 1300)   Phoned patient to ensure she had surgical consult.  She is seeing Dr. Barry Dienes on 06/15/2019.  States she had a great visit with Dr. Mike Gip on 06/05/19.  Feels good about treatment, prognosis.

## 2019-06-08 ENCOUNTER — Telehealth: Payer: Self-pay | Admitting: Internal Medicine

## 2019-06-08 ENCOUNTER — Other Ambulatory Visit: Payer: Self-pay

## 2019-06-08 DIAGNOSIS — I10 Essential (primary) hypertension: Secondary | ICD-10-CM

## 2019-06-08 MED ORDER — TELMISARTAN 40 MG PO TABS: 40.0000 mg | ORAL_TABLET | Freq: Every day | ORAL | 3 refills | Status: AC

## 2019-06-08 NOTE — Telephone Encounter (Signed)
Does she want mail order or local pharmacy ? We keep switching back and forth and we need to stick with one or the other   Last sent to [facility] Tel[facility]   [place]

## 2019-06-09 ENCOUNTER — Other Ambulatory Visit: Payer: Self-pay | Admitting: General Surgery

## 2019-06-09 DIAGNOSIS — Z8042 Family history of malignant neoplasm of prostate: Secondary | ICD-10-CM | POA: Diagnosis not present

## 2019-06-09 DIAGNOSIS — Z170 Estrogen receptor positive status [ER+]: Secondary | ICD-10-CM | POA: Diagnosis not present

## 2019-06-09 DIAGNOSIS — C50412 Malignant neoplasm of upper-outer quadrant of left female breast: Secondary | ICD-10-CM

## 2019-06-09 DIAGNOSIS — R928 Other abnormal and inconclusive findings on diagnostic imaging of breast: Secondary | ICD-10-CM

## 2019-06-09 NOTE — Telephone Encounter (Signed)
We either send to [facility] or [facility] not both which would she like to be her pharmacy

## 2019-06-09 NOTE — Telephone Encounter (Signed)
Again, as mentioned below, pt states that she will keep it like it is listed & she will let us know if anything needs to be changed.

## 2019-06-09 NOTE — Telephone Encounter (Signed)
Pt states that she uses [facility] but she thinks we send it to [facility] and they send it to [facility]. So we will keep it like it is listed & she will let us know if anything needs to be changed.

## 2019-06-10 ENCOUNTER — Other Ambulatory Visit: Payer: Self-pay | Admitting: General Surgery

## 2019-06-10 DIAGNOSIS — C50412 Malignant neoplasm of upper-outer quadrant of left female breast: Secondary | ICD-10-CM

## 2019-06-10 DIAGNOSIS — Z170 Estrogen receptor positive status [ER+]: Secondary | ICD-10-CM

## 2019-06-12 ENCOUNTER — Other Ambulatory Visit: Payer: Self-pay | Admitting: General Surgery

## 2019-06-12 DIAGNOSIS — C50412 Malignant neoplasm of upper-outer quadrant of left female breast: Secondary | ICD-10-CM

## 2019-06-12 DIAGNOSIS — Z170 Estrogen receptor positive status [ER+]: Secondary | ICD-10-CM

## 2019-06-25 ENCOUNTER — Encounter (HOSPITAL_COMMUNITY): Payer: Self-pay

## 2019-06-25 ENCOUNTER — Encounter (HOSPITAL_COMMUNITY)
Admission: RE | Admit: 2019-06-25 | Discharge: 2019-06-25 | Disposition: A | Discharge status: Home / Self Care | Payer: Medicare Other | Source: Ambulatory Visit | Attending: General Surgery | Admitting: General Surgery

## 2019-06-25 ENCOUNTER — Other Ambulatory Visit: Payer: Self-pay

## 2019-06-25 DIAGNOSIS — I10 Essential (primary) hypertension: Secondary | ICD-10-CM | POA: Insufficient documentation

## 2019-06-25 DIAGNOSIS — Z01812 Encounter for preprocedural laboratory examination: Secondary | ICD-10-CM | POA: Diagnosis not present

## 2019-06-25 HISTORY — DX: Presence of spectacles and contact lenses: Z97.3

## 2019-06-25 HISTORY — DX: Anemia, unspecified: D64.9

## 2019-06-25 HISTORY — DX: Malignant (primary) neoplasm, unspecified: C80.1

## 2019-06-25 HISTORY — DX: Other seasonal allergic rhinitis: J30.2

## 2019-06-25 LAB — BASIC METABOLIC PANEL
Anion gap: 10 (ref 5–15)
BUN: 15 mg/dL (ref 8–23)
CO2: 26 mmol/L (ref 22–32)
Calcium: 9 mg/dL (ref 8.9–10.3)
Chloride: 102 mmol/L (ref 98–111)
Creatinine, Ser: 0.81 mg/dL (ref 0.44–1.00)
GFR calc Af Amer: >60 mL/min (ref >60)
GFR calc non Af Amer: >60 mL/min (ref >60)
Glucose, Bld: 108 mg/dL — ABNORMAL HIGH (ref 70–99)
Potassium: 4.3 mmol/L (ref 3.5–5.1)
Sodium: 138 mmol/L (ref 135–145)

## 2019-06-25 LAB — CBC
HCT: 41.4 % (ref 36.0–46.0)
Hemoglobin: 13.2 g/dL (ref 12.0–15.0)
MCH: 30.4 pg (ref 26.0–34.0)
MCHC: 31.9 g/dL (ref 30.0–36.0)
MCV: 95.4 fL (ref 80.0–100.0)
Platelets: 307 10*3/uL (ref 150–400)
RBC: 4.34 MIL/uL (ref 3.87–5.11)
RDW: 12.7 % (ref 11.5–15.5)
WBC: 5.5 10*3/uL (ref 4.0–10.5)
nRBC: 0 % (ref 0.0–0.2)

## 2019-06-25 NOTE — Pre-Procedure Instructions (Signed)
   [facility]  06/25/2019     [id number] - [place], [place] - [place] [address] [place] [address] Phone: [phone] Fax: [phone]   Your procedure is scheduled on Thursday, July 02, 2019  Report to [facility] Admitting at 11:30 A.M.  Call this number if you have problems the morning of surgery:  [phone]   Remember: Brush your teeth the morning of surgery with your regular toothpaste.   Do not eat after midnight Wednesday, July 01, 2019   You may drink clear liquids until  10:30 A.M. .  Clear liquids allowed are:                    Water, Juice (non-citric and without pulp), Carbonated beverages, Clear Tea, Black Coffee only, Plain Jell-O only, Gatorade and Plain Popsicles only  Take these medicines the morning of surgery with A SIP OF WATER : none Stop taking Aspirin (unless otherwise advised by surgeon), vitamins, fish oil and herbal medications (ALOE VERA, Ultranol otc bladder supplement,"blood flow - 7" otc circulation supplement )  . Do not take any NSAIDs ie: Ibuprofen, Advil, Naproxen (Aleve), Motrin, BC and Goody Powder; stop now.    Do not wear jewelry, make-up or nail polish.  Do not wear lotions, powders, or perfumes, or deodorant.  Do not shave 48 hours prior to surgery.    Do not bring valuables to the hospital.  [facility] is not responsible for any belongings or valuables.  Contacts, dentures or bridgework may not be worn into surgery.   For patients admitted to the hospital, discharge time will be determined by your treatment team. Patients discharged the day of surgery will not be allowed to drive home.  Special instructions: Shower the night before and morning of surgery with CHG. Please read over the following fact sheets that you were given. Pain Booklet, Coughing and Deep Breathing and Surgical Site Infection Prevention

## 2019-06-25 NOTE — Progress Notes (Signed)
Pt denies SOB, chest pain, and being under the care of a cardiologist. Pt denies having a cardiac cath but stated that a stress test was performed > 10 years ago. Pt denies having an EKG and chest x ray within the last year. Pt denies recent labs. Pt stated that Dr. Karlyn Agee- Scocuzza is her PCP. Pt verbalized understanding of all pre-op instructions. Pt chart forwarded to PA, Anesthesiology, for review.

## 2019-06-26 ENCOUNTER — Encounter: Payer: Self-pay | Admitting: Internal Medicine

## 2019-06-26 DIAGNOSIS — H6062 Unspecified chronic otitis externa, left ear: Secondary | ICD-10-CM | POA: Diagnosis not present

## 2019-06-26 DIAGNOSIS — H6123 Impacted cerumen, bilateral: Secondary | ICD-10-CM | POA: Diagnosis not present

## 2019-06-26 NOTE — Anesthesia Preprocedure Evaluation (Addendum)
Anesthesia Evaluation  Patient identified by MRN, date of birth, ID band Patient awake    Reviewed: Allergy & Precautions, NPO status , Patient's Chart, lab work & pertinent test results  Airway Mallampati: II  TM Distance: >3 FB Neck ROM: Full    Dental  (+) Teeth Intact, Dental Advisory Given, Caps   Pulmonary neg pulmonary ROS,    Pulmonary exam normal breath sounds clear to auscultation       Cardiovascular hypertension, Pt. on medications + Peripheral Vascular Disease  Normal cardiovascular exam Rhythm:Regular Rate:Normal     Neuro/Psych PSYCHIATRIC DISORDERS Anxiety Depression negative neurological ROS     GI/Hepatic negative GI ROS, Neg liver ROS,   Endo/Other  negative endocrine ROS  Renal/GU negative Renal ROS     Musculoskeletal  (+) Arthritis ,   Abdominal   Peds  Hematology  (+) Blood dyscrasia, anemia ,   Anesthesia Other Findings Day of surgery medications reviewed with the patient.  Left breast cancer   Reproductive/Obstetrics                           Anesthesia Physical Anesthesia Plan  ASA: III  Anesthesia Plan: General   Post-op Pain Management:  Regional for Post-op pain   Induction: Intravenous  PONV Risk Score and Plan: 3 and Dexamethasone and Ondansetron  Airway Management Planned: Oral ETT  Additional Equipment:   Intra-op Plan:   Post-operative Plan: Extubation in OR  Informed Consent: I have reviewed the patients History and Physical, chart, labs and discussed the procedure including the risks, benefits and alternatives for the proposed anesthesia with the patient or authorized representative who has indicated his/her understanding and acceptance.     Dental advisory given  Plan Discussed with: CRNA  Anesthesia Plan Comments: (Echo ordered 04/2018 by PCP to eval murmur. Essentially normal with murmur attributed to aortic sclerosis.   TTE  04/23/18: - Left ventricle: The cavity size was normal. Systolic function was   normal. The estimated ejection fraction was in the range of 60%   to 65%. Wall motion was normal; there were no regional wall   motion abnormalities. Doppler parameters are consistent with   abnormal left ventricular relaxation (grade 1 diastolic   dysfunction). - Aortic valve: There was mild regurgitation. - Mitral valve: There was mild regurgitation. - Left atrium: The atrium was normal in size. - Right ventricle: Systolic function was normal. - Pulmonary arteries: Systolic pressure was within the normal   range.  Impressions:  - Murmur likley secondary to aortic valve sclerosis without   significant stenosis.)     Anesthesia Quick Evaluation

## 2019-06-29 ENCOUNTER — Other Ambulatory Visit
Admission: RE | Admit: 2019-06-29 | Discharge: 2019-06-29 | Disposition: A | Discharge status: Home / Self Care | Payer: Medicare Other | Source: Ambulatory Visit | Attending: General Surgery | Admitting: General Surgery

## 2019-06-29 ENCOUNTER — Other Ambulatory Visit (HOSPITAL_COMMUNITY): Payer: Medicare Other

## 2019-06-29 ENCOUNTER — Other Ambulatory Visit: Payer: Self-pay

## 2019-06-29 DIAGNOSIS — Z01812 Encounter for preprocedural laboratory examination: Secondary | ICD-10-CM | POA: Insufficient documentation

## 2019-06-29 DIAGNOSIS — R928 Other abnormal and inconclusive findings on diagnostic imaging of breast: Secondary | ICD-10-CM | POA: Insufficient documentation

## 2019-06-29 DIAGNOSIS — Z20828 Contact with and (suspected) exposure to other viral communicable diseases: Secondary | ICD-10-CM | POA: Insufficient documentation

## 2019-06-29 LAB — SARS CORONAVIRUS 2 (TAT 6-24 HRS): SARS Coronavirus 2: NEGATIVE

## 2019-07-02 ENCOUNTER — Ambulatory Visit (HOSPITAL_COMMUNITY): Payer: Medicare Other | Admitting: Anesthesiology

## 2019-07-02 ENCOUNTER — Ambulatory Visit (HOSPITAL_COMMUNITY): Payer: Medicare Other | Admitting: Physician Assistant

## 2019-07-02 ENCOUNTER — Ambulatory Visit (HOSPITAL_COMMUNITY): Payer: Medicare Other

## 2019-07-02 ENCOUNTER — Ambulatory Visit
Admission: RE | Admit: 2019-07-02 | Discharge: 2019-07-02 | Disposition: A | Discharge status: Home / Self Care | Payer: Medicare Other | Source: Ambulatory Visit | Attending: General Surgery | Admitting: General Surgery

## 2019-07-02 ENCOUNTER — Encounter (HOSPITAL_COMMUNITY)
Admission: RE | Admit: 2019-07-02 | Discharge: 2019-07-02 | Disposition: A | Discharge status: Home / Self Care | Payer: Medicare Other | Source: Ambulatory Visit | Attending: General Surgery | Admitting: General Surgery

## 2019-07-02 ENCOUNTER — Ambulatory Visit (HOSPITAL_COMMUNITY)
Admission: RE | Admit: 2019-07-02 | Discharge: 2019-07-02 | Disposition: A | Discharge status: Home / Self Care | Payer: Medicare Other | Attending: General Surgery | Admitting: General Surgery

## 2019-07-02 ENCOUNTER — Other Ambulatory Visit: Payer: Self-pay

## 2019-07-02 ENCOUNTER — Encounter (HOSPITAL_COMMUNITY)
Admission: RE | Disposition: A | Discharge status: Home / Self Care | Payer: Self-pay | Source: Home / Self Care | Attending: General Surgery

## 2019-07-02 DIAGNOSIS — Z170 Estrogen receptor positive status [ER+]: Secondary | ICD-10-CM

## 2019-07-02 DIAGNOSIS — C50412 Malignant neoplasm of upper-outer quadrant of left female breast: Secondary | ICD-10-CM

## 2019-07-02 DIAGNOSIS — C50912 Malignant neoplasm of unspecified site of left female breast: Secondary | ICD-10-CM | POA: Diagnosis not present

## 2019-07-02 DIAGNOSIS — D0511 Intraductal carcinoma in situ of right breast: Secondary | ICD-10-CM | POA: Insufficient documentation

## 2019-07-02 DIAGNOSIS — Z79899 Other long term (current) drug therapy: Secondary | ICD-10-CM | POA: Diagnosis not present

## 2019-07-02 DIAGNOSIS — Z90710 Acquired absence of both cervix and uterus: Secondary | ICD-10-CM | POA: Insufficient documentation

## 2019-07-02 DIAGNOSIS — I739 Peripheral vascular disease, unspecified: Secondary | ICD-10-CM | POA: Diagnosis not present

## 2019-07-02 DIAGNOSIS — I10 Essential (primary) hypertension: Secondary | ICD-10-CM | POA: Insufficient documentation

## 2019-07-02 DIAGNOSIS — R928 Other abnormal and inconclusive findings on diagnostic imaging of breast: Secondary | ICD-10-CM | POA: Diagnosis not present

## 2019-07-02 DIAGNOSIS — N6092 Unspecified benign mammary dysplasia of left breast: Secondary | ICD-10-CM | POA: Diagnosis not present

## 2019-07-02 DIAGNOSIS — Z8042 Family history of malignant neoplasm of prostate: Secondary | ICD-10-CM | POA: Insufficient documentation

## 2019-07-02 DIAGNOSIS — G8918 Other acute postprocedural pain: Secondary | ICD-10-CM | POA: Diagnosis not present

## 2019-07-02 DIAGNOSIS — R921 Mammographic calcification found on diagnostic imaging of breast: Secondary | ICD-10-CM | POA: Diagnosis not present

## 2019-07-02 HISTORY — PX: BREAST LUMPECTOMY WITH RADIOACTIVE SEED AND SENTINEL LYMPH NODE BIOPSY: SHX6550

## 2019-07-02 HISTORY — PX: RADIOACTIVE SEED GUIDED EXCISIONAL BREAST BIOPSY: SHX6490

## 2019-07-02 SURGERY — BREAST LUMPECTOMY WITH RADIOACTIVE SEED AND SENTINEL LYMPH NODE BIOPSY
Anesthesia: Regional | Site: Breast | Laterality: Right

## 2019-07-02 MED ORDER — CEFAZOLIN SODIUM-DEXTROSE 2-4 GM/100ML-% IV SOLN
INTRAVENOUS | Status: AC
  Filled 2019-07-02: qty 100

## 2019-07-02 MED ORDER — PROPOFOL 10 MG/ML IV BOLUS
INTRAVENOUS | Status: DC | PRN
  Administered 2019-07-02: 100 mg via INTRAVENOUS
  Administered 2019-07-02: 30 mg via INTRAVENOUS

## 2019-07-02 MED ORDER — TECHNETIUM TC 99M SULFUR COLLOID FILTERED
1.0000 | Freq: Once | INTRAVENOUS | Status: AC | PRN
  Administered 2019-07-02: 1 via INTRADERMAL

## 2019-07-02 MED ORDER — FENTANYL CITRATE (PF) 250 MCG/5ML IJ SOLN
INTRAMUSCULAR | Status: AC
  Filled 2019-07-02: qty 5

## 2019-07-02 MED ORDER — SODIUM CHLORIDE (PF) 0.9 % IJ SOLN
INTRAMUSCULAR | Status: AC
  Filled 2019-07-02: qty 10

## 2019-07-02 MED ORDER — SODIUM CHLORIDE 0.9 % IV SOLN
INTRAVENOUS | Status: DC | PRN
  Administered 2019-07-02: 12:00:00 40 ug/min via INTRAVENOUS

## 2019-07-02 MED ORDER — SUGAMMADEX SODIUM 200 MG/2ML IV SOLN
INTRAVENOUS | Status: DC | PRN
  Administered 2019-07-02: 200 mg via INTRAVENOUS

## 2019-07-02 MED ORDER — LACTATED RINGERS IV SOLN
INTRAVENOUS | Status: DC | PRN
  Administered 2019-07-02 (×2): via INTRAVENOUS

## 2019-07-02 MED ORDER — EPHEDRINE 5 MG/ML INJ
INTRAVENOUS | Status: AC
  Filled 2019-07-02: qty 10

## 2019-07-02 MED ORDER — FENTANYL CITRATE (PF) 100 MCG/2ML IJ SOLN
INTRAMUSCULAR | Status: AC
  Administered 2019-07-02: 12:00:00 50 ug
  Filled 2019-07-02: qty 2

## 2019-07-02 MED ORDER — 0.9 % SODIUM CHLORIDE (POUR BTL) OPTIME
TOPICAL | Status: DC | PRN
  Administered 2019-07-02: 12:00:00 1000 mL

## 2019-07-02 MED ORDER — DEXAMETHASONE SODIUM PHOSPHATE 10 MG/ML IJ SOLN
INTRAMUSCULAR | Status: DC | PRN
  Administered 2019-07-02: 8 mg via INTRAVENOUS

## 2019-07-02 MED ORDER — PHENYLEPHRINE 40 MCG/ML (10ML) SYRINGE FOR IV PUSH (FOR BLOOD PRESSURE SUPPORT)
PREFILLED_SYRINGE | INTRAVENOUS | Status: AC
  Filled 2019-07-02: qty 10

## 2019-07-02 MED ORDER — LACTATED RINGERS IV SOLN: INTRAVENOUS | Status: DC

## 2019-07-02 MED ORDER — OXYCODONE HCL 5 MG PO TABS: 5.0000 mg | ORAL_TABLET | Freq: Four times a day (QID) | ORAL | 0 refills | Status: DC | PRN

## 2019-07-02 MED ORDER — PHENYLEPHRINE 40 MCG/ML (10ML) SYRINGE FOR IV PUSH (FOR BLOOD PRESSURE SUPPORT)
PREFILLED_SYRINGE | INTRAVENOUS | Status: DC | PRN
  Administered 2019-07-02 (×2): 80 ug via INTRAVENOUS

## 2019-07-02 MED ORDER — ACETAMINOPHEN 500 MG PO TABS
ORAL_TABLET | ORAL | Status: AC
  Filled 2019-07-02: qty 2

## 2019-07-02 MED ORDER — EPHEDRINE SULFATE-NACL 50-0.9 MG/10ML-% IV SOSY
PREFILLED_SYRINGE | INTRAVENOUS | Status: DC | PRN
  Administered 2019-07-02 (×2): 5 mg via INTRAVENOUS
  Administered 2019-07-02: 10 mg via INTRAVENOUS

## 2019-07-02 MED ORDER — LIDOCAINE HCL (PF) 1 % IJ SOLN
INTRAMUSCULAR | Status: AC
  Filled 2019-07-02: qty 30

## 2019-07-02 MED ORDER — ACETAMINOPHEN 500 MG PO TABS
1000.0000 mg | ORAL_TABLET | ORAL | Status: AC
  Administered 2019-07-02: 1000 mg via ORAL

## 2019-07-02 MED ORDER — LIDOCAINE HCL 1 % IJ SOLN
INTRAMUSCULAR | Status: DC | PRN
  Administered 2019-07-02 (×2): 20 mL via INTRAMUSCULAR

## 2019-07-02 MED ORDER — GABAPENTIN 100 MG PO CAPS
ORAL_CAPSULE | ORAL | Status: AC
  Administered 2019-07-02: 11:00:00 100 mg
  Filled 2019-07-02: qty 1

## 2019-07-02 MED ORDER — BUPIVACAINE-EPINEPHRINE (PF) 0.25% -1:200000 IJ SOLN
INTRAMUSCULAR | Status: AC
  Filled 2019-07-02: qty 30

## 2019-07-02 MED ORDER — ONDANSETRON HCL 4 MG/2ML IJ SOLN
INTRAMUSCULAR | Status: DC | PRN
  Administered 2019-07-02: 4 mg via INTRAVENOUS

## 2019-07-02 MED ORDER — ROCURONIUM BROMIDE 50 MG/5ML IV SOSY
PREFILLED_SYRINGE | INTRAVENOUS | Status: DC | PRN
  Administered 2019-07-02: 40 mg via INTRAVENOUS

## 2019-07-02 MED ORDER — METHYLENE BLUE 0.5 % INJ SOLN
INTRAVENOUS | Status: AC
  Filled 2019-07-02: qty 10

## 2019-07-02 MED ORDER — CEFAZOLIN SODIUM-DEXTROSE 2-4 GM/100ML-% IV SOLN
2.0000 g | INTRAVENOUS | Status: AC
  Administered 2019-07-02: 12:00:00 2 g via INTRAVENOUS

## 2019-07-02 MED ORDER — MIDAZOLAM HCL 2 MG/2ML IJ SOLN
INTRAMUSCULAR | Status: AC
  Filled 2019-07-02: qty 2

## 2019-07-02 MED ORDER — GABAPENTIN 100 MG PO CAPS: 100.0000 mg | ORAL_CAPSULE | ORAL | Status: DC

## 2019-07-02 MED ORDER — ONDANSETRON HCL 4 MG/2ML IJ SOLN
INTRAMUSCULAR | Status: AC
  Filled 2019-07-02: qty 2

## 2019-07-02 MED ORDER — LIDOCAINE 2% (20 MG/ML) 5 ML SYRINGE
INTRAMUSCULAR | Status: DC | PRN
  Administered 2019-07-02: 60 mg via INTRAVENOUS

## 2019-07-02 MED ORDER — DEXAMETHASONE SODIUM PHOSPHATE 10 MG/ML IJ SOLN
INTRAMUSCULAR | Status: AC
  Filled 2019-07-02: qty 1

## 2019-07-02 MED ORDER — LIDOCAINE 2% (20 MG/ML) 5 ML SYRINGE
INTRAMUSCULAR | Status: AC
  Filled 2019-07-02: qty 5

## 2019-07-02 MED ORDER — PROPOFOL 10 MG/ML IV BOLUS
INTRAVENOUS | Status: AC
  Filled 2019-07-02: qty 20

## 2019-07-02 MED ORDER — FENTANYL CITRATE (PF) 100 MCG/2ML IJ SOLN
INTRAMUSCULAR | Status: DC | PRN
  Administered 2019-07-02 (×2): 50 ug via INTRAVENOUS

## 2019-07-02 MED ORDER — CHLORHEXIDINE GLUCONATE CLOTH 2 % EX PADS: 6.0000 | MEDICATED_PAD | Freq: Once | CUTANEOUS | Status: DC

## 2019-07-02 MED ORDER — BUPIVACAINE-EPINEPHRINE (PF) 0.5% -1:200000 IJ SOLN
INTRAMUSCULAR | Status: DC | PRN
  Administered 2019-07-02: 30 mL via PERINEURAL

## 2019-07-02 MED ORDER — ROCURONIUM BROMIDE 10 MG/ML (PF) SYRINGE
PREFILLED_SYRINGE | INTRAVENOUS | Status: AC
  Filled 2019-07-02: qty 10

## 2019-07-02 SURGICAL SUPPLY — 57 items
APPLIER CLIP 9.375 MED OPEN (MISCELLANEOUS)
BINDER BREAST LRG (GAUZE/BANDAGES/DRESSINGS) IMPLANT
BINDER BREAST XLRG (GAUZE/BANDAGES/DRESSINGS) ×2 IMPLANT
BLADE SURG 10 STRL SS (BLADE) ×4 IMPLANT
BNDG COHESIVE 4X5 TAN STRL (GAUZE/BANDAGES/DRESSINGS) ×4 IMPLANT
CANISTER SUCT 3000ML PPV (MISCELLANEOUS) ×4 IMPLANT
CHLORAPREP W/TINT 26 (MISCELLANEOUS) ×4 IMPLANT
CLIP APPLIE 9.375 MED OPEN (MISCELLANEOUS) IMPLANT
CLIP VESOCCLUDE LG 6/CT (CLIP) ×4 IMPLANT
CLIP VESOCCLUDE MED 6/CT (CLIP) ×6 IMPLANT
CLIP VESOCCLUDE SM WIDE 6/CT (CLIP) ×4 IMPLANT
CLOSURE WOUND 1/2 X4 (GAUZE/BANDAGES/DRESSINGS) ×1
CONT SPEC 4OZ CLIKSEAL STRL BL (MISCELLANEOUS) IMPLANT
COVER PROBE W GEL 5X96 (DRAPES) ×4 IMPLANT
COVER SURGICAL LIGHT HANDLE (MISCELLANEOUS) ×4 IMPLANT
COVER WAND RF STERILE (DRAPES) ×4 IMPLANT
DERMABOND ADVANCED (GAUZE/BANDAGES/DRESSINGS) ×2
DERMABOND ADVANCED .7 DNX12 (GAUZE/BANDAGES/DRESSINGS) ×2 IMPLANT
DEVICE DUBIN SPECIMEN MAMMOGRA (MISCELLANEOUS) ×6 IMPLANT
DRAPE CHEST BREAST 15X10 FENES (DRAPES) ×4 IMPLANT
DRSG PAD ABDOMINAL 8X10 ST (GAUZE/BANDAGES/DRESSINGS) ×4 IMPLANT
ELECT CAUTERY BLADE 6.4 (BLADE) ×4 IMPLANT
ELECT COATED BLADE 2.86 ST (ELECTRODE) ×4 IMPLANT
ELECT NDL BLADE 2-5/6 (NEEDLE) ×2 IMPLANT
ELECT NEEDLE BLADE 2-5/6 (NEEDLE) ×4 IMPLANT
ELECT REM PT RETURN 9FT ADLT (ELECTROSURGICAL) ×4
ELECTRODE REM PT RTRN 9FT ADLT (ELECTROSURGICAL) ×2 IMPLANT
GLOVE BIO SURGEON STRL SZ 6 (GLOVE) ×4 IMPLANT
GLOVE INDICATOR 6.5 STRL GRN (GLOVE) ×4 IMPLANT
GOWN STRL REUS W/ TWL LRG LVL3 (GOWN DISPOSABLE) ×2 IMPLANT
GOWN STRL REUS W/TWL 2XL LVL3 (GOWN DISPOSABLE) ×4 IMPLANT
GOWN STRL REUS W/TWL LRG LVL3 (GOWN DISPOSABLE) ×2
ILLUMINATOR WAVEGUIDE N/F (MISCELLANEOUS) IMPLANT
KIT BASIN OR (CUSTOM PROCEDURE TRAY) ×4 IMPLANT
KIT MARKER MARGIN INK (KITS) ×4 IMPLANT
LIGHT WAVEGUIDE WIDE FLAT (MISCELLANEOUS) ×2 IMPLANT
NDL 18GX1X1/2 (RX/OR ONLY) (NEEDLE) IMPLANT
NDL FILTER BLUNT 18X1 1/2 (NEEDLE) IMPLANT
NDL HYPO 25GX1X1/2 BEV (NEEDLE) ×2 IMPLANT
NEEDLE 18GX1X1/2 (RX/OR ONLY) (NEEDLE) IMPLANT
NEEDLE FILTER BLUNT 18X 1/2SAF (NEEDLE)
NEEDLE FILTER BLUNT 18X1 1/2 (NEEDLE) IMPLANT
NEEDLE HYPO 25GX1X1/2 BEV (NEEDLE) ×4 IMPLANT
NS IRRIG 1000ML POUR BTL (IV SOLUTION) ×4 IMPLANT
PACK GENERAL/GYN (CUSTOM PROCEDURE TRAY) ×4 IMPLANT
PACK UNIVERSAL I (CUSTOM PROCEDURE TRAY) ×4 IMPLANT
STOCKINETTE IMPERVIOUS 9X36 MD (GAUZE/BANDAGES/DRESSINGS) ×4 IMPLANT
STRIP CLOSURE SKIN 1/2X4 (GAUZE/BANDAGES/DRESSINGS) ×3 IMPLANT
SUT MNCRL AB 4-0 PS2 18 (SUTURE) ×4 IMPLANT
SUT VIC AB 2-0 SH 27 (SUTURE) ×2
SUT VIC AB 2-0 SH 27XBRD (SUTURE) ×2 IMPLANT
SUT VIC AB 3-0 SH 27 (SUTURE) ×2
SUT VIC AB 3-0 SH 27X BRD (SUTURE) ×2 IMPLANT
SUT VIC AB 3-0 SH 8-18 (SUTURE) ×4 IMPLANT
SYR CONTROL 10ML LL (SYRINGE) ×4 IMPLANT
TOWEL GREEN STERILE (TOWEL DISPOSABLE) ×4 IMPLANT
TOWEL GREEN STERILE FF (TOWEL DISPOSABLE) ×4 IMPLANT

## 2019-07-02 NOTE — Interval H&P Note (Signed)
History and Physical Interval Note:  07/02/2019 10:15 AM  Elaine Price  has presented today for surgery, with the diagnosis of RIGHT ABNORMAL MAMMORGRAM, LEFT BREAST CANCER.  The various methods of treatment have been discussed with the patient and family. After consideration of risks, benefits and other options for treatment, the patient has consented to  Procedure(s): LEFT BREAST LUMPECTOMY WITH RADIOACTIVE SEED AND LEFT SENTINEL LYMPH NODE BIOPSY (Left) RIGHT BREAST  RADIOACTIVE SEED LOCALIZATION EXCISIONAL BIOPSY (Right) as a surgical intervention.  The patient's history has been reviewed, patient examined, no change in status, stable for surgery.  I have reviewed the patient's chart and labs.  Questions were answered to the patient's satisfaction.     [name]

## 2019-07-02 NOTE — Progress Notes (Signed)
Patient d/c'd back to [facility] with their transportation team Richardson Landry).  Spoke with health admissions coordinator, Seth Bake, who states patient has ready access to nursing care via pullstring in her room.  They will also implement hourly rounding checks on patient this evening.

## 2019-07-02 NOTE — H&P (Signed)
Elaine Price Documented: 06/09/2019 1:52 PM Location: [facility] Patient #: [phone] DOB: Apr 18, 1935 Married / Language: Elaine Price / Race: White Female   History of Present Illness Elaine Klein MD; 06/09/2019 2:56 PM) The patient is a 84 year old female who presents with breast cancer. Pt is an 84 yo F who presents 06/2019 with screening detected left breast cancer. She was seen to have architectural distortion and calcifications in her breast. These did not resolve on dx mammogram. She was found to have a 8 mm mass at 1 o'clock 9 cm from the nipple on the LEFT. She was seen to have 9 mm of calcifications in the central RIGHT breast. These were both sampled by core needle biopsy. The mass on the left was invasive mammary carcinoma, +/+/-. The right sided calcifications were found to be fibroadenomatous change with ADH.   She denies any prior history of breast issues. She has not had any previous breast biopsies. She has no family history of breast or ovarian cancer. She has had a brother and father with prostate cancer, but both were diagnosed in their 34s. One brother had lung cancer, but he was a heavy smoker. Menarche was age 76. Menopause was late 29s with hysterectomy. She is a G3P3 and used OCPs for a short while.   dx mammogram/us 05/21/2019 CLINICAL DATA: 84 year old female recalled from screening mammogram dated 05/12/2019 for possible left breast distortion in right breast calcifications.  EXAM: DIGITAL DIAGNOSTIC BILATERAL MAMMOGRAM WITH CAD AND TOMO  ULTRASOUND LEFT BREAST  COMPARISON: Previous exam(s).  ACR Breast Density Category c: The breast tissue is heterogeneously dense, which may obscure small masses.  FINDINGS: There is persistent architectural distortion with an associated central mass in the far posterior upper outer left breast.  Coarse heterogeneous calcifications spanning approximately 8 x 9 mm are noted in the central right breast.  These have an indeterminate morphology.  Mammographic images were processed with CAD.  Targeted ultrasound is performed, showing an irregular hypoechoic mass with posterior acoustic shadowing at the 1 o'clock position 9 cm from the nipple. It measures 8 x 7 x 7 mm. Note is made of associated feeding vascularity. This correlates well with the mammographic finding.  Evaluation of the left axilla demonstrates no suspicious lymphadenopathy.  IMPRESSION: 1. Suspicious left breast mass corresponding with the screening mammographic findings. Recommendation is for ultrasound-guided biopsy. 2. Indeterminate right breast calcifications. Recommendation is for stereotactic biopsy. 3. No suspicious left axillary lymphadenopathy.  RECOMMENDATION: Ultrasound-guided biopsy of the left breast and stereotactic biopsy of the right breast.  I have discussed the findings and recommendations with the patient. Results were also provided in writing at the conclusion of the visit. If applicable, a reminder letter will be sent to the patient regarding the next appointment.  BI-RADS CATEGORY 4: Suspicious.  pathology 05/28/2019 Reason for Addendum #1: Breast Biomarker Results  SPECIMEN SUBMITTED: A. Breast, right, upper central; biopsy B. Breast, left, upper outer quadrant; biopsy  CLINICAL HISTORY: 1. Indeterminate right breast calcifications, 8 x 9 mm. 2. Suspicious mass/distortion in left breast 8 x 7 x 7 mm  PRE-OPERATIVE DIAGNOSIS: 1. FA vs DCIS, favor FA. 2. CA vs CSL  POST-OPERATIVE DIAGNOSIS: Post biopsy mammograms show X-shaped clip at site of calcifications upper central right breast. The coil shaped clip is appropriately positioned 4 mm from the inferior border of the targeted mass/distortion UOQ left breast.    DIAGNOSIS: A. BREAST, RIGHT, UPPER CENTRAL; STEREOTACTIC-GUIDED CORE BIOPSY: - FIBROADENOMATOUS CHANGES WITH STROMAL HYALINIZATION AND CALCIFICATIONS. -  FOCAL  ATYPICAL DUCTAL HYPERPLASIA.  Comment: Additional deeper sections will be reviewed on block A2 for documentation of calcifications. Any contributory findings will be reported in an addendum.  B. BREAST, LEFT, UPPER OUTER QUADRANT; STEREOTACTIC-GUIDED CORE BIOPSY: - INVASIVE MAMMARY CARCINOMA, NO SPECIAL TYPE.  Size of invasive carcinoma: 6 mm in this sample Histologic grade of invasive carcinoma: Grade 1 Glandular/tubular differentiation score: 1 Nuclear pleomorphism score: 2 Mitotic rate score: 1 Total score: 4 Ductal carcinoma in situ: Not definitively identified but difficult to exclude because the invasive component has areas with cribriform features Lymphovascular invasion: Not identified  ER/PR/HER2: Immunohistochemistry will be performed on block B3, with reflex to [place] for HER2 2+. The results will be reported in an addendum.  Comment: The definitive grade will be assigned on the excisional specimen. These findings were communicated to Elaine Acres, RN, in Dr. Reynolds Bowl office on 05/29/2019 via [place] [facility] [place].  Surgical Pathology * THIS IS AN ADDENDUM REPORT * CASE: [id number] PATIENT: Elaine Price Surgical Pathology Report   Reason for Addendum #1: Breast Biomarker Results  SPECIMEN SUBMITTED: A. Breast, right, upper central; biopsy B. Breast, left, upper outer quadrant; biopsy  CLINICAL HISTORY: 1. Indeterminate right breast calcifications, 8 x 9 mm. 2. Suspicious mass/distortion in left breast 8 x 7 x 7 mm  PRE-OPERATIVE DIAGNOSIS: 1. FA vs DCIS, favor FA. 2. CA vs CSL  POST-OPERATIVE DIAGNOSIS: Post biopsy mammograms show X-shaped clip at site of calcifications upper central right breast. The coil shaped clip is appropriately positioned 4 mm from the inferior border of the targeted mass/distortion UOQ left breast.    DIAGNOSIS: A. BREAST, RIGHT, UPPER CENTRAL;  STEREOTACTIC-GUIDED CORE BIOPSY: - FIBROADENOMATOUS CHANGES WITH STROMAL HYALINIZATION AND CALCIFICATIONS. - FOCAL ATYPICAL DUCTAL HYPERPLASIA.  Comment: Additional deeper sections will be reviewed on block A2 for documentation of calcifications. Any contributory findings will be reported in an addendum.  B. BREAST, LEFT, UPPER OUTER QUADRANT; STEREOTACTIC-GUIDED CORE BIOPSY: - INVASIVE MAMMARY CARCINOMA, NO SPECIAL TYPE.  Size of invasive carcinoma: 6 mm in this sample Histologic grade of invasive carcinoma: Grade 1 Glandular/tubular differentiation score: 1 Nuclear pleomorphism score: 2 Mitotic rate score: 1 Total score: 4 Ductal carcinoma in situ: Not definitively identified but difficult to exclude because the invasive component has areas with cribriform features Lymphovascular invasion: Not identified  ER/PR/HER2: Immunohistochemistry will be performed on block B3, with reflex to [place] for HER2 2+. The results will be reported in an addendum.  Comment: The definitive grade will be assigned on the excisional specimen. These findings were communicated to Elaine Acres, RN, in Dr. Reynolds Bowl office on 05/29/2019 via [place] [facility] [place].  GROSS DESCRIPTION: A. Labeled: #1 right breast stereo biopsy calcification upper central Received: in a formalin-filled [place] collection device Accompanying specimen radiograph: Yes, marked C, E, F, K Time/Date in fixative: Collection at 8:45 AM, put in formalin 8:50 AM 05/28/2019 Cold ischemic time: 5 minutes Total fixation time: 12 hours Core pieces: Multiple Measurement: 0.2 cm in width each and ranging from 0.5-1.5 cm in length Description / comments: Mostly fatty soft tissue cores Inked: Blue Entirely submitted in 6 cassette(s): A1- section C A2- section E A3- section F A4- section K A5-A6-entirely submitted breast cores  B. Labeled: Left breast stereo  biopsy distortion #2 Received: in a formalin-filled [place] collection device Accompanying specimen radiograph: No Time / date in fixative: Collection at 9:20 AM, put in formalin at 9:25 AM 05/28/2019 Cold ischemic time: 5 minutes Total fixation time: 11 hours Core pieces: Multiple Measurement:  0.2 cm in width each and ranging from 0.5 to 1.5 cm in length Description / comments: Multiparity soft tissue cores Inked: Black Entirely submitted in 4 cassettes   Final Diagnosis performed by Bryan Lemma, MD. Electronically signed 05/29/2019 5:30:46PM The electronic signature indicates that the named Attending Pathologist has evaluated the specimen Technical component performed at [facility], [address], [place], [place] [address] Lab: [phone] Dir: Rush Farmer, MD, MMM Professional component performed at [facility], [facility], [place], [place], [place] [address] Lab: [phone] Dir: Dellia Nims. Rubinas, MD  ADDENDUM: BREAST BIOMARKER TESTS Estrogen Receptor (ER) Status: POSITIVE Percentage of cells with nuclear positivity: >90% Average intensity of staining: Strong  Progesterone Receptor (PgR) Status: POSITIVE Percentage of cells with nuclear positivity: >90% Average intensity of staining: Strong  HER2 (by immunohistochemistry): NEGATIVE (Score 1)  Cold Ischemia and Fixation Times: Meet requirements specified in latest version of the ASCO/CAP guidelines Testing Performed on Block Number(s): B3   Past Surgical History Elaine Klein, MD; 06/09/2019 2:15 PM) Appendectomy  Breast Biopsy  Bilateral. Gallbladder Surgery - Open  Hysterectomy (not due to cancer) - Complete  Shoulder Surgery  Right. Tonsillectomy   Diagnostic Studies History Elaine Klein, MD; 06/09/2019 2:15 PM) Colonoscopy  5-10 years ago Mammogram  within last year  Allergies (April Staton, [place];  06/09/2019 1:53 PM) No Known Drug Allergies  [06/09/2019]:  Medication History (April Staton, CMA; 06/09/2019 1:57 PM) Telmisartan (40MG Tablet, Oral) Active. Alga-K (Oral) Active. Ultranol Bladder Active. TrueVision Active. Nitric Oxide Blood Flow - 7 Active. Medications Reconciled  Social History Elaine Klein, MD; 06/09/2019 2:15 PM) Alcohol use  Occasional alcohol use. Caffeine use  Carbonated beverages, Coffee. No drug use  Tobacco use  Never smoker.  Family History Elaine Klein, MD; 06/09/2019 2:15 PM) Alcohol Abuse  Father. Cancer  Brother. Cerebrovascular Accident  Mother. Depression  Brother. Heart Disease  Mother. Heart disease in female family member before age 82  Heart disease in female family member before age 33  Hypertension  Brother. Prostate Cancer  Brother, Father. Respiratory Condition  Brother.  Pregnancy / Birth History Elaine Klein, MD; 06/09/2019 2:15 PM) Age at menarche  52 years. Age of menopause  73-50 Contraceptive History  Oral contraceptives. Gravida  3 Maternal age  75-30 Para  55  Other Problems Elaine Klein, MD; 06/09/2019 2:15 PM) Anxiety Disorder  Breast Cancer  High blood pressure  Lump In Breast     Review of Systems Elaine Klein MD; 06/09/2019 2:15 PM) General Not Present- Appetite Loss, Chills, Fatigue, Fever, Night Sweats, Weight Gain and Weight Loss. Skin Not Present- Change in Wart/Mole, Dryness, Hives, Jaundice, New Lesions, Non-Healing Wounds, Rash and Ulcer. HEENT Present- Seasonal Allergies and Wears glasses/contact lenses. Not Present- Earache, Hearing Loss, Hoarseness, Nose Bleed, Oral Ulcers, Ringing in the Ears, Sinus Pain, Sore Throat, Visual Disturbances and Yellow Eyes. Respiratory Not Present- Bloody sputum, Chronic Cough, Difficulty Breathing, Snoring and Wheezing. Breast Present- Breast Mass. Not Present- Breast Pain, Nipple Discharge and Skin Changes. Cardiovascular Not Present- Chest Pain,  Difficulty Breathing Lying Down, Leg Cramps, Palpitations, Rapid Heart Rate, Shortness of Breath and Swelling of Extremities. Gastrointestinal Not Present- Abdominal Pain, Bloating, Bloody Stool, Change in Bowel Habits, Chronic diarrhea, Constipation, Difficulty Swallowing, Excessive gas, Gets full quickly at meals, Hemorrhoids, Indigestion, Nausea, Rectal Pain and Vomiting. Female Genitourinary Not Present- Frequency, Nocturia, Painful Urination, Pelvic Pain and Urgency. Musculoskeletal Not Present- Back Pain, Joint Pain, Joint Stiffness, Muscle Pain, Muscle Weakness and Swelling of Extremities. Neurological Not Present-  Decreased Memory, Fainting, Headaches, Numbness, Seizures, Tingling, Tremor, Trouble walking and Weakness. Psychiatric Present- Anxiety and Fearful. Not Present- Bipolar, Change in Sleep Pattern, Depression and Frequent crying. Endocrine Not Present- Cold Intolerance, Excessive Hunger, Hair Changes, Heat Intolerance, Hot flashes and New Diabetes. Hematology Not Present- Blood Thinners, Easy Bruising, Excessive bleeding, Gland problems, HIV and Persistent Infections.  Vitals (April Staton CMA; 06/09/2019 1:58 PM) 06/09/2019 1:57 PM Weight: 149.5 lb Height: 66in Body Surface Area: 1.77 m Body Mass Index: 24.13 kg/m  Temp.: 98.40F (Oral)  Pulse: 102 (Regular)  P.OX: 96% (Room air) BP: 146/80(Sitting, Left Arm, Standard)       Physical Exam Elaine Klein MD; 06/09/2019 3:01 PM) General Mental Status-Alert. General Appearance-Consistent with stated age. Hydration-Well hydrated. Voice-Normal.  Head and Neck Head-normocephalic, atraumatic with no lesions or palpable masses. Trachea-midline. Thyroid Gland Characteristics - normal size and consistency.  Eye Eyeball - Bilateral-Extraocular movements intact. Sclera/Conjunctiva - Bilateral-No scleral icterus.  Chest and Lung Exam Chest and lung exam reveals -quiet, even and easy respiratory  effort with no use of accessory muscles and on auscultation, normal breath sounds, no adventitious sounds and normal vocal resonance. Inspection Chest Wall - Normal. Back - normal.  Breast Note: breasts wtih marked ptosis bilaterally. symmetric. no palpable masses. no nipple retraction or nipple discharge. no skin dimpling. small scab UOQ left breast in axillary tail. Small scab UIQ right bresat. no LAD.   Cardiovascular Cardiovascular examination reveals -normal heart sounds, regular rate and rhythm with no murmurs and normal pedal pulses bilaterally.  Abdomen Inspection Inspection of the abdomen reveals - No Hernias. Palpation/Percussion Palpation and Percussion of the abdomen reveal - Soft, Non Tender, No Rebound tenderness, No Rigidity (guarding) and No hepatosplenomegaly. Auscultation Auscultation of the abdomen reveals - Bowel sounds normal.  Neurologic Neurologic evaluation reveals -alert and oriented x 3 with no impairment of recent or remote memory. Mental Status-Normal.  Musculoskeletal Global Assessment -Note: no gross deformities.  Normal Exam - Left-Upper Extremity Strength Normal and Lower Extremity Strength Normal. Normal Exam - Right-Upper Extremity Strength Normal and Lower Extremity Strength Normal.  Lymphatic Head & Neck  General Head & Neck Lymphatics: Bilateral - Description - Normal. Axillary  General Axillary Region: Bilateral - Description - Normal. Tenderness - Non Tender. Femoral & Inguinal  Generalized Femoral & Inguinal Lymphatics: Bilateral - Description - No Generalized lymphadenopathy.    Assessment & Plan Elaine Klein MD; 06/09/2019 3:05 PM)  MALIGNANT NEOPLASM OF UPPER-OUTER QUADRANT OF LEFT BREAST IN FEMALE, ESTROGEN RECEPTOR POSITIVE (C50.412) Impression: Pt will need seed or wire localized lumpectomy. I will discuss SLN wtih oncology. Given age and small size of tumor, not sure that this information would change our  management.  In any event, she is a good candidate for lumpectomy. I discussed the procedure and risks with the patient. I discussed location/seed/wire to depend on timing and what is available. Will try for first mutually possible time.  The surgical procedure was described to the patient. I discussed the incision type and location and that we would need radiology involved on with a wire or seed marker and/or sentinel node.  The risks and benefits of the procedure were described to the patient and she wishes to proceed.  We discussed the risks bleeding, infection, damage to other structures, need for further procedures/surgeries. We discussed the risk of seroma. The patient was advised if the area in the breast in cancer, we may need to go back to surgery for additional tissue to obtain negative margins or  for a lymph node biopsy. The patient was advised that these are the most common complications, but that others can occur as well. They were advised against taking aspirin or other anti-inflammatory agents/blood thinners the week before surgery.   ABNORMAL MAMMOGRAM OF RIGHT BREAST (R92.8)  Current Plans You are being scheduled for surgery- Our schedulers will call you.  You should hear from our office's scheduling department within 5 working days about the location, date, and time of surgery. We try to make accommodations for patient's preferences in scheduling surgery, but sometimes the OR schedule or the surgeon's schedule prevents [place] from making those accommodations.  If you have not heard from our office [phone]) in 5 working days, call the office and ask for your surgeon's nurse.  If you have other questions about your diagnosis, plan, or surgery, call the office and ask for your surgeon's nurse.  Pt Education - flb breast cancer surgery: discussed with patient and provided information.  FAMILY HISTORY OF PROSTATE CANCER IN FATHER [phone]) Impression: Father and brother with  prostate CA. Will refer to genetics.  Current Plans Referred to Genetic Counseling, for evaluation and follow up (Medical Genetics). Routine.   Signed by Elaine Klein, MD (06/09/2019 3:06 PM)

## 2019-07-02 NOTE — Op Note (Signed)
Right seed localized excisional biopsy, left Breast Radioactive seed localized lumpectomy and sentinel lymph node biopsy  Indications: This patient presents with history of abnormal right mammogram and left breast invasive mammary carcinoma, upper outer quadrant, +/+/-  Pre-operative Diagnosis: left breast cancer, abnormal right mammogram  Post-operative Diagnosis: Same  Surgeon: [name]   Assist:  Judyann Munson, RNFA  Anesthesia: General endotracheal anesthesia  ASA Class: 3  Procedure Details  The patient was seen in the Holding Room. The risks, benefits, complications, treatment options, and expected outcomes were discussed with the patient. The possibilities of bleeding, infection, the need for additional procedures, failure to diagnose a condition, and creating a complication requiring transfusion or operation were discussed with the patient. The patient concurred with the proposed plan, giving informed consent.  The site of surgery properly noted/marked. The patient was taken to Operating Room # 12, identified, and the procedure verified as Right breast seed localized excisional biopsy, left Breast seed localized Lumpectomy with sentinel lymph node biopsy. A Time Out was held and the above information confirmed.  The left arm, breast, and chest as well as right breast/chest were prepped and draped in standard fashion.  The right breast was addressed first.  The lumpectomy was performed by creating a superior circumareolar incision near the previously placed radioactive seed.  Dissection was carried down to around the point of maximum signal intensity. The cautery was used to perform the dissection.  Hemostasis was achieved with cautery.   The specimen was inked with the margin marker paint kit.    Specimen radiography confirmed inclusion of the mammographic lesion, the clip, and the seed.  The background signal in the breast was zero.  The wound was irrigated and closed with 3-0  vicryl in layers and 4-0 monocryl subcuticular suture.    The left breast was addressed similarly.  The left seed was able to be reached via the axillary incision.  So an additional incision was not needed on the breast.    Using a hand-held gamma probe, left axillary sentinel nodes were identified through the axillary incision.  Dissection was carried through the clavipectoral fascia.  Three deep level two axillary sentinel nodes were removed.  Counts per second were 250, 95, and 30.    The background count was 10 cps.  The wound was irrigated.  Hemostasis was achieved with cautery.  The axillary incision was closed with a 3-0 vicryl deep dermal interrupted sutures and a 4-0 monocryl subcuticular closure.    Sterile dressings were applied. At the end of the operation, all sponge, instrument, and needle counts were correct.  Findings: grossly clear surgical margins and no palpable adenopathy. Anterior margins is skin, posterior margin is pectoralis.  Estimated Blood Loss:  min         Specimens: right breast tissue with seed, left breast lumpectomy with seed and three left axillary sentinel lymph nodes.             Complications:  None; patient tolerated the procedure well.         Disposition: PACU - hemodynamically stable.         Condition: stable

## 2019-07-02 NOTE — Transfer of Care (Signed)
Immediate Anesthesia Transfer of Care Note  Patient: Elaine Price  Procedure(s) Performed: LEFT BREAST LUMPECTOMY WITH RADIOACTIVE SEED AND LEFT SENTINEL LYMPH NODE BIOPSY (Left Axilla) RIGHT BREAST  RADIOACTIVE SEED LOCALIZATION EXCISIONAL BIOPSY (Right Breast)  Patient Location: PACU  Anesthesia Type:General and Regional  Level of Consciousness: drowsy  Airway & Oxygen Therapy: Patient Spontanous Breathing and Patient connected to face mask oxygen  Post-op Assessment: Report given to RN and Post -op Vital signs reviewed and stable  Post vital signs: Reviewed and stable  Last Vitals:  Vitals Value Taken Time  BP 142/51 07/02/19 1330  Temp    Pulse 80 07/02/19 1332  Resp 18 07/02/19 1332  SpO2 98 % 07/02/19 1332  Vitals shown include unvalidated device data.  Last Pain:  Vitals:   07/02/19 1036  TempSrc: Oral         Complications: No apparent anesthesia complications

## 2019-07-02 NOTE — Anesthesia Procedure Notes (Signed)
Procedure Name: Intubation Date/Time: 07/02/2019 12:03 PM Performed by: Orlie Dakin, CRNA Pre-anesthesia Checklist: Patient identified, Emergency Drugs available, Suction available and Patient being monitored Patient Re-evaluated:Patient Re-evaluated prior to induction Oxygen Delivery Method: Circle system utilized Preoxygenation: Pre-oxygenation with 100% oxygen Induction Type: IV induction Ventilation: Mask ventilation without difficulty Laryngoscope Size: Miller and 3 Grade View: Grade II Tube type: Oral Tube size: 7.5 mm Number of attempts: 1 Airway Equipment and Method: Stylet Placement Confirmation: ETT inserted through vocal cords under direct vision,  positive ETCO2 and breath sounds checked- equal and bilateral Secured at: 24 cm Tube secured with: Tape Dental Injury: Teeth and Oropharynx as per pre-operative assessment  Comments: 4x4s bite block used.

## 2019-07-02 NOTE — Anesthesia Procedure Notes (Signed)
Anesthesia Regional Block: Pectoralis block   Pre-Anesthetic Checklist: ,, timeout performed, Correct Patient, Correct Site, Correct Laterality, Correct Procedure, Correct Position, site marked, Risks and benefits discussed,  Surgical consent,  Pre-op evaluation,  At surgeon's request and post-op pain management  Laterality: Left  Prep: chloraprep       Needles:  Injection technique: Single-shot  Needle Type: Echogenic Needle     Needle Length: 9cm  Needle Gauge: 21     Additional Needles:   Procedures:,,,, ultrasound used (permanent image in chart),,,,  Narrative:  Start time: 07/02/2019 11:15 AM End time: 07/02/2019 11:25 AM Injection made incrementally with aspirations every 5 mL.  Performed by: Personally  Anesthesiologist: Catalina Gravel, MD  Additional Notes: No pain on injection. No increased resistance to injection. Injection made in 5cc increments.  Good needle visualization.  Patient tolerated procedure well.

## 2019-07-02 NOTE — Discharge Instructions (Addendum)
[facility] °Office Phone Number [phone] ° °BREAST BIOPSY/ PARTIAL MASTECTOMY: POST OP INSTRUCTIONS ° °Always review your discharge instruction sheet given to you by the facility where your surgery was performed. ° °IF YOU HAVE DISABILITY OR FAMILY LEAVE FORMS, YOU MUST BRING THEM TO THE OFFICE FOR PROCESSING.  DO NOT GIVE THEM TO YOUR DOCTOR. ° °1. A prescription for pain medication may be given to you upon discharge.  Take your pain medication as prescribed, if needed.  If narcotic pain medicine is not needed, then you may take acetaminophen (Tylenol) or ibuprofen (Advil) as needed. °2. Take your usually prescribed medications unless otherwise directed °3. If you need a refill on your pain medication, please contact your pharmacy.  They will contact our office to request authorization.  Prescriptions will not be filled after 5pm or on week-ends. °4. You should eat very light the first 24 hours after surgery, such as soup, crackers, pudding, etc.  Resume your normal diet the day after surgery. °5. Most patients will experience some swelling and bruising in the breast.  Ice packs and a good support bra will help.  Swelling and bruising can take several days to resolve.  °6. It is common to experience some constipation if taking pain medication after surgery.  Increasing fluid intake and taking a stool softener will usually help or prevent this problem from occurring.  A mild laxative (Milk of Magnesia or Miralax) should be taken according to package directions if there are no bowel movements after 48 hours. °7. Unless discharge instructions indicate otherwise, you may remove your bandages 48 hours after surgery, and you may shower at that time.  You may have steri-strips (small skin tapes) in place directly over the incision.  These strips should be left on the skin for 7-10 days.   Any sutures or staples will be removed at the office during your follow-up visit. °8. ACTIVITIES:  You may resume  regular daily activities (gradually increasing) beginning the next day.  Wearing a good support bra or sports bra (or the breast binder) minimizes pain and swelling.  You may have sexual intercourse when it is comfortable. °a. You may drive when you no longer are taking prescription pain medication, you can comfortably wear a seatbelt, and you can safely maneuver your car and apply brakes. °b. RETURN TO WORK:  __________1 week_______________ °9. You should see your doctor in the office for a follow-up appointment approximately two weeks after your surgery.  Your doctor’s nurse will typically make your follow-up appointment when she calls you with your pathology report.  Expect your pathology report 2-3 business days after your surgery.  You may call to check if you do not hear from us after three days. ° ° °WHEN TO CALL YOUR DOCTOR: °1. Fever over 101.0 °2. Nausea and/or vomiting. °3. Extreme swelling or bruising. °4. Continued bleeding from incision. °5. Increased pain, redness, or drainage from the incision. ° °The clinic staff is available to answer your questions during regular business hours.  Please don’t hesitate to call and ask to speak to one of the nurses for clinical concerns.  If you have a medical emergency, go to the nearest emergency room or call 911.  A surgeon from [facility] is always on call at the hospital. ° °For further questions, please visit [email or web address]  ° °

## 2019-07-03 ENCOUNTER — Encounter (HOSPITAL_COMMUNITY): Payer: Self-pay | Admitting: General Surgery

## 2019-07-03 NOTE — Anesthesia Postprocedure Evaluation (Signed)
Anesthesia Post Note  Patient: Elaine Price  Procedure(s) Performed: LEFT BREAST LUMPECTOMY WITH RADIOACTIVE SEED AND LEFT SENTINEL LYMPH NODE BIOPSY (Left Axilla) RIGHT BREAST  RADIOACTIVE SEED LOCALIZATION EXCISIONAL BIOPSY (Right Breast)     Patient location during evaluation: PACU Anesthesia Type: Regional and General Level of consciousness: awake and alert, awake and oriented Pain management: pain level controlled Vital Signs Assessment: post-procedure vital signs reviewed and stable Respiratory status: spontaneous breathing, nonlabored ventilation, respiratory function stable and patient connected to nasal cannula oxygen Cardiovascular status: blood pressure returned to baseline and stable Postop Assessment: no apparent nausea or vomiting Anesthetic complications: no    Last Vitals:  Vitals:   07/02/19 1415 07/02/19 1445  BP: (!) 170/77 (!) 165/72  Pulse: 71 70  Resp: 18 17  Temp:    SpO2: 100% 100%    Last Pain:  Vitals:   07/02/19 1415  TempSrc:   PainSc: 0-No pain                 Catalina Gravel

## 2019-07-08 NOTE — Progress Notes (Signed)
I am not sure either.  She is a patient of Dr. Mike Gip, and I sent her team a note to schedule follow-up.  Thank you!!!

## 2019-07-08 NOTE — Progress Notes (Signed)
  Oncology Nurse Navigator Documentation  Navigator Location: [facility] (07/08/19 0900)   )    Abnormal Finding Date: 07/08/19 (07/08/19 0900)   Surgery Date: 07/02/19 (07/08/19 0900)             Patient Visit Type: Follow-up (07/08/19 0900)   Barriers/Navigation Needs: Coordination of Care;Education (07/08/19 0900)   Interventions: Coordination of Care;Psycho-Social Support (07/08/19 0900)   Coordination of Care: Appts (07/08/19 0900)                  Time Spent with Patient: 30 (07/08/19 0900)   Phoned patient post-op.  Doing well.  Waiting for follow-up appointments with Dr. Barry Dienes, and Dr. Mike Gip.  Message sent to Dr. Kem Parkinson team to schedule appointment.

## 2019-07-09 NOTE — Progress Notes (Signed)
Please let patient know margins and nodes are negative.

## 2019-07-10 ENCOUNTER — Other Ambulatory Visit: Payer: Self-pay | Admitting: General Surgery

## 2019-07-10 ENCOUNTER — Encounter: Payer: Self-pay | Admitting: Internal Medicine

## 2019-07-19 NOTE — Progress Notes (Signed)
[facility]  [address], [address] [place], [place] [address] Phone: [phone]  Fax: [phone]   Clinic Day:  07/21/2019  Referring physician: McLean-Scocuzza, Olivia Mackie *  Chief Complaint: Elaine Price is a 84 y.o. female with stage IA left breast cancer and right breast atypical ductal hyperplasia who is seen for assessment after interval surgery and discussion regarding direction of therapy.   HPI: The patient was last seen in the medical oncology clinic on 06/05/2019. At that time, she felt well.  Exam revealed bilateral fibrocystic changes and recent biopsy changes. We discussed surgical resection and radiation if lumpectomy was pursued. CBC was normal. CA 27.29 was 20.7.   She underwent a left breast lumpectomy and sentinel lymph node biopsy by Dr. [name] on 07/02/2019.  Pathology revealed 1.0 cm intermediate grade DCIS with calcifications associated with carcinoma.  Margins were uninvolved with carcinoma (< 1 mm; superior and lateral).  There was no residual carcinoma (prior biopsy 6 mm).  Three sentinel lymph nodes were negative.  Tumor was ER+ (90%), PR+ (90%), and Her2/neu - (1+).  Pathologc stage for DCIS was pTis pN0.  Pathologic stage of the prior invasive carcinoma was pT1b pN0.  Right breast lumpectomy revealed lobular neoplasia [facility]) with previous biopsy changes.   During the interim, the patient felt "very good". She had some fatigue after surgery. She has edema around her incision.  She is not interested in Oncotype DX testing as she would decline chemotherapy. She declines Prolia. She would like to be on tamoxifen.    Past Medical History:  Diagnosis Date  . Anemia    PMH  . Arthritis   . Cancer [facility])    left breast cancer and right abnormal mammogram  . Depression    since death of husband in 2014/01/24   . Diverticulosis   . Essential hypertension   . Gallstones   . Gastritis and duodenitis    dounf by CT Nov 2012  .  Hyperlipemia   . Pneumonia    hx of  . Seasonal allergies   . Wears glasses     Past Surgical History:  Procedure Laterality Date  . ABDOMINAL HYSTERECTOMY    . ACROMIO-CLAVICULAR JOINT REPAIR Right 07/04/2015   Procedure: ACROMIO-CLAVICULAR JOINT REPAIR;  Surgeon: Tania Ade, MD;  Location: [place];  Service: Orthopedics;  Laterality: Right;  Right open reduction internal fixation clavical with allograft, coracoclaviular reconstruction  . APPENDECTOMY    . BREAST BIOPSY Right 12/28/2018   affirm bx-"x" clip-path pending  . BREAST BIOPSY Left 05/28/2019   Affirm Biopsy- Coil clip- path pending  . BREAST LUMPECTOMY WITH RADIOACTIVE SEED AND SENTINEL LYMPH NODE BIOPSY Left 07/02/2019   Procedure: LEFT BREAST LUMPECTOMY WITH RADIOACTIVE SEED AND LEFT SENTINEL LYMPH NODE BIOPSY;  Surgeon: [name], MD;  Location: [place];  Service: General;  Laterality: Left;  . CHOLECYSTECTOMY  2011/01/25  . CHOLECYSTECTOMY, LAPAROSCOPIC  08/06/2011   gallstones  . COLONOSCOPY  02/18/2006   2 mm rectal polyp, diverticulosis  . ORIF CLAVICULAR FRACTURE Right 07/04/2015   Procedure: OPEN REDUCTION INTERNAL FIXATION (ORIF) CLAVICULAR FRACTURE;  Surgeon: Tania Ade, MD;  Location: [place];  Service: Orthopedics;  Laterality: Right;  . RADIOACTIVE SEED GUIDED EXCISIONAL BREAST BIOPSY Right 07/02/2019   Procedure: RIGHT BREAST  RADIOACTIVE SEED LOCALIZATION EXCISIONAL BIOPSY;  Surgeon: [name], MD;  Location: [place];  Service: General;  Laterality: Right;  . TONSILECTOMY, ADENOIDECTOMY, BILATERAL MYRINGOTOMY AND TUBES  as a child, does not remember date    Family History  Problem Relation Age of Onset  . Stroke Mother   . Heart attack Mother   . Prostate cancer Father   . Lung cancer Brother   . Prostate cancer Brother   . Hypertension Brother   . Colon cancer Neg Hx     Social History:  reports that she has never smoked. She has never used smokeless  tobacco. She reports current alcohol use. She reports that she does not use drugs. She denies any known exposure to radiation or toxins. 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 She is originally from the [place], but lived in [place] for 16 years. She has lived in [place] for 26-27 years. She lives in [place] at the [facility] retirement community.  Her husband died of cancer 5 years ago. She has 3 children, one who lives in [place], one in [place], and one in [place]. She has 7 grandchildren.  She likes to swim.  The patient is alone today.  Allergies:  Allergies  Allergen Reactions  . Lexapro [Escitalopram Oxalate] Itching    Current Medications: Current Outpatient Medications  Medication Sig Dispense Refill  . ALOE VERA PO Take 1 Dose by mouth daily as needed (indigestion).    . Multiple Vitamins-Minerals (EYE VITAMINS) CAPS Take 1 capsule by mouth daily. "true vision"    . OVER THE COUNTER MEDICATION Take 1 tablet by mouth daily. Ultranol otc bladder supplement    . OVER THE COUNTER MEDICATION Take 3 tablets by mouth daily. "blood flow - 7" otc circulation supplement    . telmisartan (MICARDIS) 40 MG tablet Take 1 tablet (40 mg total) by mouth daily. 90 tablet 3  . oxyCODONE (OXY IR/ROXICODONE) 5 MG immediate release tablet Take 1 tablet (5 mg total) by mouth every 6 (six) hours as needed for severe pain. 15 tablet 0   No current facility-administered medications for this visit.     Review of Systems  Constitutional: Positive for malaise/fatigue (after surgery). Negative for chills, diaphoresis, fever and weight loss (up 3 pounds).       Feels "good".  Energy level up and down since surgery.  HENT: Negative.  Negative for congestion, hearing loss, sinus pain and sore throat.   Eyes: Negative.  Negative for blurred vision and double vision.  Respiratory: Negative.  Negative for cough, shortness of breath and wheezing.    Cardiovascular: Negative.  Negative for chest pain, palpitations, orthopnea, leg swelling and PND.  Gastrointestinal: Negative.  Negative for abdominal pain, blood in stool, constipation, diarrhea, melena, nausea and vomiting.  Genitourinary: Negative.  Negative for dysuria, frequency, hematuria and urgency.  Musculoskeletal: Negative.  Negative for back pain, joint pain and myalgias.  Skin: Negative.  Negative for rash.  Neurological: Negative.  Negative for dizziness, tingling, sensory change, weakness and headaches.  Endo/Heme/Allergies: Negative.  Does not bruise/bleed easily.  Psychiatric/Behavioral: Negative.  Negative for depression, memory loss and substance abuse. The patient is not nervous/anxious and does not have insomnia.   All other systems reviewed and are negative.  Performance status (ECOG): 0  Vitals Blood pressure 129/69, pulse 80, temperature 98.2 F (36.8 C), temperature source Tympanic, resp. rate 18, height '5\' 6"'  (1.676 m), weight 150 lb 11 oz (68.3 kg), SpO2 100 %.  Physical Exam  Constitutional: She is oriented to person, place, and time. She appears well-developed and well-nourished. No distress.  HENT:  Head:  Normocephalic and atraumatic.  Mouth/Throat: Oropharynx is clear and moist. No oropharyngeal exudate.  Short gray hair. Mask.  Eyes: Pupils are equal, round, and reactive to light. Conjunctivae and EOM are normal. No scleral icterus.  Glasses.  Blue eyes.  Neck: Normal range of motion. Neck supple. No JVD present.  Cardiovascular: Normal rate, regular rhythm and normal heart sounds.  No murmur heard. Pulmonary/Chest: Effort normal and breath sounds normal. No respiratory distress. She has no wheezes. She has no rales. Right breast exhibits no inverted nipple, no mass, no nipple discharge, no skin change and no tenderness. Left breast exhibits no inverted nipple, no mass, no nipple discharge, no skin change and no tenderness. Breasts are symmetrical (right  supra-areolar incision; left axillary incision with hematoma).  Abdominal: Soft. Bowel sounds are normal. She exhibits no distension. There is no abdominal tenderness. There is no rebound and no guarding.  Musculoskeletal: Normal range of motion.        General: No edema.  Lymphadenopathy:    She has no cervical adenopathy.    She has no axillary adenopathy.       Right: No supraclavicular adenopathy present.       Left: No supraclavicular adenopathy present.  Neurological: She is alert and oriented to person, place, and time.  Skin: Skin is warm and dry. No rash noted. She is not diaphoretic. No erythema.  Psychiatric: She has a normal mood and affect. Her behavior is normal. Judgment and thought content normal.  Nursing note and vitals reviewed.   No visits with results within 3 Day(s) from this visit.  Latest known visit with results is:  Hospital Outpatient Visit on 06/29/2019  Component Date Value Ref Range Status  . SARS Coronavirus 2 06/29/2019 NEGATIVE  NEGATIVE Final   Comment: (NOTE) SARS-CoV-2 target nucleic acids are NOT DETECTED. The SARS-CoV-2 RNA is generally detectable in upper and lower respiratory specimens during the acute phase of infection. Negative results do not preclude SARS-CoV-2 infection, do not rule out co-infections with other pathogens, and should not be used as the sole basis for treatment or other patient management decisions. Negative results must be combined with clinical observations, patient history, and epidemiological information. The expected result is Negative. Fact Sheet for Patients: SugarRoll.be Fact Sheet for Healthcare Providers: https://www.woods-mathews.com/ This test is not yet approved or cleared by the [place] FDA and  has been authorized for detection and/or diagnosis of SARS-CoV-2 by FDA under an Emergency Use Authorization (EUA). This EUA will remain  in effect (meaning this test  can be used) for the duration of the COVID-19 declaration under Section 56                          4(b)(1) of the Act, 21 U.S.C. section 360bbb-3(b)(1), unless the authorization is terminated or revoked sooner. Performed at [facility], [place] [address] [place], [place] [address]     Assessment:  Tu Shimmel is a 84 y.o. female with stage IA left breast cancer s/p lumpectomy and sentinel lymph node biopsy on 07/02/2019.  Left breast pathology revealed no residual carcinoma (prior biopsy 6 mm).  Three sentinel lymph nodes were negative.  Tumor was ER+ (90%), PR+ (90%), and Her2/neu - (1+).  Pathologic stage was pT1b pN0.  Right breast lumpectomy on 07/02/2019 revealed 1.0 cm intermediate grade DCIS with calcifications associated with carcinoma.  Margins were uninvolved with carcinoma (< 1 mm; superior and lateral).  Pathologic stage was  pTis.   Bilateral diagnostic mammogram on 05/21/2019 revealed persistent architectural distortion with an associated mass in the far posterior upper outer left breast. Ultrasound revealed an 8 x 7 x 7 mm irregular hypoechoic mass with posterior acoustic shadowing at the 1 o'clock position 9 cm from the nipple in the left breast. There was no left axillary adenopathy.   Bilateral diagnostic mammogram on 05/21/2019 revealed calcifications spanning 8 x 9 mm in the central right breast of indeterminate morphology.  Right breast biopsy on 05/28/2019 revealed fibroadenomatous changes with stromal hyalinization and calcifications. There was focal atypical ductal hyperplasia.   Bone density on 05/12/2019 revealed osteoporosis with a T-score of -3.4 at the forearm radius.   Symptomatically, she is doing well.  Exam reveals mild post-operative changes.  Plan: 1.   Stage IA left breast cancer             Review interval pathology.  Per report, no residual carcinoma.  Three sentinel lymph nodes were negative.  Discuss patient's thoughts about  Oncotype DX testing.  Discuss plan for endocrine therapy alone.   Discuss aromatase inhibitors versus tamoxifen.   Review prior bone density confirming osteoporosis.   Patient would like to pursue tamoxifen after radiation.  Discuss plan for radiation followed by 5 years of tamoxifen. 2.   DCIS right breast  Original pathology revealed atypical ductal hyperplasia (ADH).             Risk of upgrade in diagnosis to DCIS or invasive disease 10-30%.             Confirm pathology with Dr. Thressa Sheller secondary to SLN referral in pathology (only performed on left side).  Review close margins of DCIS (additional excision versus radiation boost). 3.   Osteoporosis             Review plan for calcium and vitamin D.             Patient declines Prolia and bisphosphonates. 4.   Radiation oncology consult. 5.   RTC after radiation for MD assessment, labs (CBC with diff, CMP), and initiation of tamoxifen. Patient to call.  I discussed the assessment and treatment plan with the patient.  The patient was provided an opportunity to ask questions and all were answered.  The patient agreed with the plan and demonstrated an understanding of the instructions.  The patient was advised to call back if the symptoms worsen or if the condition fails to improve as anticipated.   Lequita Asal, MD, PhD    07/21/2019, 10:55 AM  I, Selena Batten, am acting as scribe for [employer]. Mike Gip, MD, PhD.  I,  C. Mike Gip, MD, have reviewed the above documentation for accuracy and completeness, and I agree with the above.

## 2019-07-21 ENCOUNTER — Other Ambulatory Visit: Payer: Self-pay

## 2019-07-21 ENCOUNTER — Encounter: Payer: Self-pay | Admitting: Hematology and Oncology

## 2019-07-21 ENCOUNTER — Inpatient Hospital Stay: Payer: Medicare Other | Attending: Hematology and Oncology | Admitting: Hematology and Oncology

## 2019-07-21 VITALS — BP 129/69 | HR 80 | Temp 98.2°F | Resp 18 | Ht 66.0 in | Wt 150.7 lb

## 2019-07-21 DIAGNOSIS — Z170 Estrogen receptor positive status [ER+]: Secondary | ICD-10-CM | POA: Insufficient documentation

## 2019-07-21 DIAGNOSIS — D0511 Intraductal carcinoma in situ of right breast: Secondary | ICD-10-CM | POA: Diagnosis not present

## 2019-07-21 DIAGNOSIS — Z7189 Other specified counseling: Secondary | ICD-10-CM | POA: Insufficient documentation

## 2019-07-21 DIAGNOSIS — M810 Age-related osteoporosis without current pathological fracture: Secondary | ICD-10-CM | POA: Insufficient documentation

## 2019-07-21 DIAGNOSIS — C50412 Malignant neoplasm of upper-outer quadrant of left female breast: Secondary | ICD-10-CM | POA: Insufficient documentation

## 2019-07-21 NOTE — Patient Instructions (Signed)
Tamoxifen oral tablet What is this medicine? TAMOXIFEN (ta MOX i fen) blocks the effects of estrogen. It is commonly used to treat breast cancer. It is also used to decrease the chance of breast cancer coming back in women who have received treatment for the disease. It may also help prevent breast cancer in women who have a high risk of developing breast cancer. This medicine may be used for other purposes; ask your health care provider or pharmacist if you have questions. COMMON BRAND NAME(S): Nolvadex What should I tell my health care provider before I take this medicine? They need to know if you have any of these conditions:  blood clots  blood disease  cataracts or impaired eyesight  endometriosis  high calcium levels  high cholesterol  irregular menstrual cycles  liver disease  stroke  uterine fibroids  an unusual reaction to tamoxifen, other medicines, foods, dyes, or preservatives  pregnant or trying to get pregnant  breast-feeding How should I use this medicine? Take this medicine by mouth with a glass of water. Follow the directions on the prescription label. You can take it with or without food. Take your medicine at regular intervals. Do not take your medicine more often than directed. Do not stop taking except on your doctor's advice. A special MedGuide will be given to you by the pharmacist with each prescription and refill. Be sure to read this information carefully each time. Talk to your pediatrician regarding the use of this medicine in children. While this drug may be prescribed for selected conditions, precautions do apply. Overdosage: If you think you have taken too much of this medicine contact a poison control center or emergency room at once. NOTE: This medicine is only for you. Do not share this medicine with others. What if I miss a dose? If you miss a dose, take it as soon as you can. If it is almost time for your next dose, take only that dose. Do  not take double or extra doses. What may interact with this medicine? Do not take this medicine with any of the following medications:  cisapride  certain medicines for irregular heart beat like dronedarone, quinidine  certain medicines for fungal infection like fluconazole, posaconazole  pimozide  saquinavir  thioridazine This medicine may also interact with the following medications:  aminoglutethimide  anastrozole  bromocriptine  chemotherapy drugs  dofetilide  female hormones, like estrogens and birth control pills  letrozole  medroxyprogesterone  phenobarbital  rifampin  warfarin This list may not describe all possible interactions. Give your health care provider a list of all the medicines, herbs, non-prescription drugs, or dietary supplements you use. Also tell them if you smoke, drink alcohol, or use illegal drugs. Some items may interact with your medicine. What should I watch for while using this medicine? Visit your doctor or health care professional for regular checks on your progress. You will need regular pelvic exams, breast exams, and mammograms. If you are taking this medicine to reduce your risk of getting breast cancer, you should know that this medicine does not prevent all types of breast cancer. If breast cancer or other problems occur, there is no guarantee that it will be found at an early stage. Do not become pregnant while taking this medicine or for 2 months after stopping it. Women should inform their doctor if they wish to become pregnant or think they might be pregnant. There is a potential for serious side effects to an unborn child. Talk to your  health care professional or pharmacist for more information. Do not breast-feed an infant while taking this medicine or for 3 months after stopping it. This medicine may interfere with the ability to have a child. Talk with your doctor or health care professional if you are concerned about your  fertility. What side effects may I notice from receiving this medicine? Side effects that you should report to your doctor or health care professional as soon as possible:  allergic reactions like skin rash, itching or hives, swelling of the face, lips, or tongue  changes in vision  changes in your menstrual cycle  difficulty walking or talking  new breast lumps  numbness  pelvic pain or pressure  redness, blistering, peeling or loosening of the skin, including inside the mouth  signs and symptoms of a dangerous change in heartbeat or heart rhythm like chest pain, dizziness, fast or irregular heartbeat, palpitations, feeling faint or lightheaded, falls, breathing problems  sudden chest pain  swelling, pain or tenderness in your calf or leg  unusual bruising or bleeding  vaginal discharge that is bloody, brown, or rust  weakness  yellowing of the whites of the eyes or skin Side effects that usually do not require medical attention (report to your doctor or health care professional if they continue or are bothersome):  fatigue  hair loss, although uncommon and is usually mild  headache  hot flashes  impotence (in men)  nausea, vomiting (mild)  vaginal discharge (white or clear) This list may not describe all possible side effects. Call your doctor for medical advice about side effects. You may report side effects to FDA at 1-800-FDA-1088. Where should I keep my medicine? Keep out of the reach of children. Store at room temperature between 20 and 25 degrees C (68 and 77 degrees F). Protect from light. Keep container tightly closed. Throw away any unused medicine after the expiration date. NOTE: This sheet is a summary. It may not cover all possible information. If you have questions about this medicine, talk to your doctor, pharmacist, or health care provider.  2020 Elsevier/Gold Standard (2018-10-14 11:15:31)   Ductal Carcinoma In Situ  Ductal carcinoma in  situ is the presence of abnormal cells in the breast. It is the earliest form of breast cancer. The abnormal cells are located only in the tubes that carry milk to the nipple (milk ducts) and have not spread to other areas. What are the causes? The exact cause of ductal carcinoma in situ is not known. What increases the risk? The following factors increase the risk of developing ductal carcinoma in situ:  Being older than 82 years of age.  Being female.  Having a family history of breast cancer.  Current or past hormone use, such as: ? Using birth control. ? Taking hormone therapy after menopause.  Starting menopause after age 60.  A personal history of: ? Breast cancer. ? Dense breasts. ? Radiation treatments to the breasts or chest area. ? Having the BRCA1 and BRCA2 genes.  Drinking more than 1 alcoholic beverage a day.  Starting your menstrual periods before age 68.  Having never been pregnant or having your first child after age 77.  Having never breastfed.  Having an inactive (sedentary) lifestyle.  Exposure to the drug DES, which was given to pregnant women from the 1940s to the 1970s. What are the signs or symptoms? Ductal carcinoma in situ does not cause any symptoms. How is this diagnosed? Ductal carcinoma in situ is usually discovered during  a routine X-ray of the breasts to check for abnormal changes (mammogram). To diagnose the condition, your health care provider may do an ultrasound and remove a tissue sample from your breast so it can be examined under a microscope (breast biopsy). Your health care provider may also remove one or more lymph nodes from under your arm to check if the abnormal cells have spread to your lymph nodes (sentinel lymph node biopsy). Lymph nodes are part of the body's disease-fighting (immune) system. They are located throughout the body. The lymph nodes under the arms are usually the first place where abnormal cells spread. How is this  treated? Ductal carcinoma in situ treatment may include:  A lumpectomy. This is surgery to remove the area of abnormal cells, along with a ring of normal tissue. This may also be called breast-conserving surgery.  Simple mastectomy. This is surgery to remove breast tissue, the nipple, and the circle of colored tissue around the nipple (areola). Sometimes, one or more lymph nodes from under the arm are also removed and tested for cancer cells.  Preventive mastectomy. This is the removal of both breasts. This is usually done only if you have a very high risk of developing breast cancer.  Radiation. This is the use of high-energy rays to kill cancer cells.  Medicines (hormone therapy) to keep the abnormal cells from spreading. Follow these instructions at home:  Take over-the-counter and prescription medicines only as told by your health care provider.  Eat a healthy diet. A healthy diet includes lots of fruits and vegetables, low-fat dairy products, lean meats, and fiber. ? Make sure half your plate is filled with fruits or vegetables. ? Choose high-fiber foods such as whole-grain breads and cereals.  Limit alcohol intake to no more than 1 drink a day for women (no drinks if you are pregnant) and 2 drinks a day for men. One drink equals 12 oz of beer, 5 oz of wine, or 1 oz of hard liquor.  Do not use any products that contain nicotine or tobacco, such as cigarettes and e-cigarettes. If you need help quitting, ask your health care provider.  Keep all follow-up visits as told by your health care provider. This is important. Where to find more information  American Cancer Society: www.cancer.[place]: www.cancer.gov Contact a health care provider if:  You have a fever.  You notice a new lump in either breast or under your arm.  You have any symptoms or changes that concern you. Get help right away if:  You have chest pain or trouble breathing. Summary   Ductal carcinoma in situ is the presence of abnormal cells in the breast. It is the earliest form of breast cancer.  The exact cause of ductal carcinoma in situ is not known. The risk increases with age and with current or past hormone use.  To diagnose the condition, your health care provider will remove a tissue sample from your breast so it can be examined under a microscope (breast biopsy). This information is not intended to replace advice given to you by your health care provider. Make sure you discuss any questions you have with your health care provider. Document Released: 05/19/2014 Document Revised: 10/04/2017 Document Reviewed: 07/30/2017 Elsevier Patient Education  2020 [employer].

## 2019-07-21 NOTE — Progress Notes (Signed)
No new changes noted today 

## 2019-07-22 ENCOUNTER — Ambulatory Visit (INDEPENDENT_AMBULATORY_CARE_PROVIDER_SITE_OTHER): Payer: Medicare Other

## 2019-07-22 ENCOUNTER — Other Ambulatory Visit: Payer: Self-pay

## 2019-07-22 DIAGNOSIS — Z23 Encounter for immunization: Secondary | ICD-10-CM | POA: Diagnosis not present

## 2019-07-28 ENCOUNTER — Other Ambulatory Visit: Payer: Self-pay

## 2019-07-29 ENCOUNTER — Ambulatory Visit
Admission: RE | Admit: 2019-07-29 | Discharge: 2019-07-29 | Disposition: A | Discharge status: Home / Self Care | Payer: Medicare Other | Source: Ambulatory Visit | Attending: Radiation Oncology | Admitting: Radiation Oncology

## 2019-07-29 ENCOUNTER — Other Ambulatory Visit: Payer: Self-pay

## 2019-07-29 ENCOUNTER — Encounter: Payer: Self-pay | Admitting: Radiation Oncology

## 2019-07-29 VITALS — BP 135/78 | HR 71 | Temp 98.2°F | Resp 16 | Wt 148.7 lb

## 2019-07-29 DIAGNOSIS — Z170 Estrogen receptor positive status [ER+]: Secondary | ICD-10-CM | POA: Diagnosis not present

## 2019-07-29 DIAGNOSIS — Z79899 Other long term (current) drug therapy: Secondary | ICD-10-CM | POA: Insufficient documentation

## 2019-07-29 DIAGNOSIS — E785 Hyperlipidemia, unspecified: Secondary | ICD-10-CM | POA: Insufficient documentation

## 2019-07-29 DIAGNOSIS — F329 Major depressive disorder, single episode, unspecified: Secondary | ICD-10-CM | POA: Diagnosis not present

## 2019-07-29 DIAGNOSIS — M1990 Unspecified osteoarthritis, unspecified site: Secondary | ICD-10-CM | POA: Diagnosis not present

## 2019-07-29 DIAGNOSIS — Z8719 Personal history of other diseases of the digestive system: Secondary | ICD-10-CM | POA: Diagnosis not present

## 2019-07-29 DIAGNOSIS — D0511 Intraductal carcinoma in situ of right breast: Secondary | ICD-10-CM

## 2019-07-29 DIAGNOSIS — D649 Anemia, unspecified: Secondary | ICD-10-CM | POA: Diagnosis not present

## 2019-07-29 DIAGNOSIS — I10 Essential (primary) hypertension: Secondary | ICD-10-CM | POA: Insufficient documentation

## 2019-07-29 DIAGNOSIS — C50412 Malignant neoplasm of upper-outer quadrant of left female breast: Secondary | ICD-10-CM | POA: Diagnosis not present

## 2019-07-29 NOTE — Consult Note (Signed)
NEW PATIENT EVALUATION  Name: Elaine Price  MRN: [id number]  Date:   07/29/2019     DOB: 05-20-1935   This 84 y.o. female patient presents to the clinic for initial evaluation of stage I (T1b N0 M0) invasive mammary carcinoma of the left breast ER PR positive HER-2/neu negative status post wide local excision and sentinel node biopsy.  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 Breast Cancer    Initial consult    DIAGNOSIS: The primary encounter diagnosis was Malignant neoplasm of upper-outer quadrant of left breast in female, estrogen receptor positive ([place]). A diagnosis of Ductal carcinoma in situ of right breast was also pertinent to this visit.   PREVIOUS INVESTIGATIONS:  Mammogram and ultrasound reviewed Clinical notes reviewed Pathology report reviewed  HPI: Patient is a 84 year old female who presented with an abnormal mammogram of both her right and left breasts.  In the left mass there was a suspicious area of architectural distortion for which ultrasound-guided biopsy was recommended.  In the right breast is indeterminate breast calcifications also biopsy recommended.  No evidence of lymphadenopathy was noted.  She underwent a biopsy of her left breast which showed a fall small focus of invasive mammary carcinoma less than 1 cm.  She underwent bilateral breast biopsies.  Right breast biopsy was performed showing fibroadenomatous changes with stromal hyalinization and focal atypical ductal hyperplasia no evidence of malignancy.  Left breast in the upper outer quadrant showed invasive mammary carcinoma 6 mm in the sample with nuclear pleomorphism score of 2.  She then underwent a lumpectomy showing ductal carcinoma in situ intermediate grade with calcifications associated with the carcinoma  margins were clear but at less than 1 mm superior and lateral.  No invasive component was seen.  Left breast wide local excision showed no residual carcinoma identified.  3 sentinel lymph nodes were negative.  In the right breast the extent of DCIS was 1 cm was grade 2 overall.  3 lymph nodes were also examined showing no evidence of metastatic disease.  In the left breast there was 6 mm of residual invasive carcinoma overall grade 1.  Patient is seen today for consideration of treatment.  She specifically denies breast tenderness cough or bone pain.  PLANNED TREATMENT REGIMEN: Bilateral breast radiation  PAST MEDICAL HISTORY:  has a past medical history of Anemia, Arthritis, Cancer ([place]), Depression, Diverticulosis, Essential hypertension, Gallstones, Gastritis and duodenitis, Hyperlipemia, Pneumonia, Seasonal allergies, and Wears glasses.    PAST SURGICAL HISTORY:  Past Surgical History:  Procedure Laterality Date  . ABDOMINAL HYSTERECTOMY    . ACROMIO-CLAVICULAR JOINT REPAIR Right 07/04/2015   Procedure: ACROMIO-CLAVICULAR JOINT REPAIR;  Surgeon: Tania Ade, MD;  Location: [facility];  Service: Orthopedics;  Laterality: Right;  Right open reduction internal fixation clavical with allograft, coracoclaviular reconstruction  . APPENDECTOMY    . BREAST BIOPSY Right 12/28/2018   affirm bx-"x" clip-path pending  . BREAST BIOPSY Left 05/28/2019   Affirm Biopsy- Coil clip- path pending  . BREAST LUMPECTOMY WITH RADIOACTIVE SEED AND SENTINEL LYMPH NODE BIOPSY Left 07/02/2019   Procedure: LEFT BREAST LUMPECTOMY WITH RADIOACTIVE SEED AND LEFT SENTINEL LYMPH NODE BIOPSY;  Surgeon: [name], MD;  Location: [facility];  Service: General;  Laterality: Left;  . CHOLECYSTECTOMY  2012  . CHOLECYSTECTOMY, LAPAROSCOPIC  08/06/2011   gallstones  . COLONOSCOPY  02/18/2006   2 mm rectal polyp, diverticulosis  . ORIF CLAVICULAR FRACTURE Right 07/04/2015   Procedure: OPEN REDUCTION INTERNAL  FIXATION (ORIF) CLAVICULAR FRACTURE;  Surgeon: Tania Ade, MD;  Location: [facility];  Service: Orthopedics;  Laterality: Right;  . RADIOACTIVE SEED GUIDED EXCISIONAL BREAST BIOPSY Right 07/02/2019   Procedure: RIGHT BREAST  RADIOACTIVE SEED LOCALIZATION EXCISIONAL BIOPSY;  Surgeon: [name], MD;  Location: [facility];  Service: General;  Laterality: Right;  . TONSILECTOMY, ADENOIDECTOMY, BILATERAL MYRINGOTOMY AND TUBES     as a child, does not remember date    FAMILY HISTORY: family history includes Heart attack in her mother; Hypertension in her brother; Lung cancer in her brother; Prostate cancer in her brother and father; Stroke in her mother.  SOCIAL HISTORY:  reports that she has never smoked. She has never used smokeless tobacco. She reports current alcohol use. She reports that she does not use drugs.  ALLERGIES: Lexapro [escitalopram oxalate]  MEDICATIONS:  Current Outpatient Medications  Medication Sig Dispense Refill  . ALOE VERA PO Take 1 Dose by mouth daily as needed (indigestion).    . Multiple Vitamins-Minerals (EYE VITAMINS) CAPS Take 1 capsule by mouth daily. "true vision"    . OVER THE COUNTER MEDICATION Take 1 tablet by mouth daily. Ultranol otc bladder supplement    . OVER THE COUNTER MEDICATION Take 3 tablets by mouth daily. "blood flow - 7" otc circulation supplement    . telmisartan (MICARDIS) 40 MG tablet Take 1 tablet (40 mg total) by mouth daily. 90 tablet 3  . oxyCODONE (OXY IR/ROXICODONE) 5 MG immediate release tablet Take 1 tablet (5 mg total) by mouth every 6 (six) hours as needed for severe pain. (Patient not taking: Reported on 07/29/2019) 15 tablet 0   No current facility-administered medications for this encounter.     ECOG PERFORMANCE STATUS:  0 - Asymptomatic  REVIEW OF SYSTEMS: Patient denies any weight loss, fatigue, weakness, fever, chills or night sweats. Patient denies any loss of vision, blurred vision. Patient denies any  ringing  of the ears or hearing loss. No irregular heartbeat. Patient denies heart murmur or history of fainting. Patient denies any chest pain or pain radiating to her upper extremities. Patient denies any shortness of breath, difficulty breathing at night, cough or hemoptysis. Patient denies any swelling in the lower legs. Patient denies any nausea vomiting, vomiting of blood, or coffee ground material in the vomitus. Patient denies any stomach pain. Patient states has had normal bowel movements no significant constipation or diarrhea. Patient denies any dysuria, hematuria or significant nocturia. Patient denies any problems walking, swelling in the joints or loss of balance. Patient denies any skin changes, loss of hair or loss of weight. Patient denies any excessive worrying or anxiety or significant depression. Patient denies any problems with insomnia. Patient denies excessive thirst, polyuria, polydipsia. Patient denies any swollen glands, patient denies easy bruising or easy bleeding. Patient denies any recent infections, allergies or URI. Patient "s visual fields have not changed significantly in recent time.   PHYSICAL EXAM: BP 135/78 (BP Location: Left Arm, Patient Position: Sitting)   Pulse 71   Temp 98.2 F (36.8 C) (Tympanic)   Resp 16   Wt 148 lb 11.2 oz (67.4 kg)   BMI 24.00 kg/m  Patient is status post bilateral wide local excisions.  The right breast has a incision around the nipple areole or complex which is well-healed.  Left breast  is more lateral incision in the upper outer quadrant.  There is no dominant mass or nodularity noted in either breast in 2 positions examined.  No axillary or supraclavicular adenopathy is identified.  Well-developed well-nourished patient in NAD. HEENT reveals PERLA, EOMI, discs not visualized.  Oral cavity is clear. No oral mucosal lesions are identified. Neck is clear without evidence of cervical or supraclavicular adenopathy. Lungs are clear to A&P.  Cardiac examination is essentially unremarkable with regular rate and rhythm without murmur rub or thrill. Abdomen is benign with no organomegaly or masses noted. Motor sensory and DTR levels are equal and symmetric in the upper and lower extremities. Cranial nerves II through XII are grossly intact. Proprioception is intact. No peripheral adenopathy or edema is identified. No motor or sensory levels are noted. Crude visual fields are within normal range.  LABORATORY DATA: Pathology report reviewed    RADIOLOGY RESULTS: Mammogram and ultrasound reviewed   IMPRESSION: Right breast ductal carcinoma in situ as well as left breast stage Ia invasive mammary carcinoma both status post wide local excision in 84 year old female  PLAN: At this time I would recommend bilateral breast radiation.  I can treat both breasts with a hypofractionated course of treatment over 3 weeks.  I would boost her right breast based on the close less than 1 mm ductal carcinoma in situ margin with a boost of 1600 and an 8 fractions.  Based on low volume of disease in the left breast did not think a boost is indicated.  Risks and benefits of treatment including skin reaction fatigue alteration of blood counts possible occlusion of superficial lung all were discussed in detail with the patient.  Patient seems to comprehend my treatment plan well.  I have personally set up and ordered CT simulation for later this week.  Patient also will be a candidate for antiestrogen therapy after completion of radiation.  I would like to take this opportunity to thank you for allowing me to participate in the care of your patient.Noreene Filbert, MD

## 2019-07-30 ENCOUNTER — Ambulatory Visit
Admission: RE | Admit: 2019-07-30 | Discharge: 2019-07-30 | Disposition: A | Discharge status: Home / Self Care | Payer: Medicare Other | Source: Ambulatory Visit | Attending: Radiation Oncology | Admitting: Radiation Oncology

## 2019-07-30 ENCOUNTER — Other Ambulatory Visit: Payer: Self-pay

## 2019-07-30 DIAGNOSIS — D0511 Intraductal carcinoma in situ of right breast: Secondary | ICD-10-CM | POA: Diagnosis not present

## 2019-07-30 DIAGNOSIS — Z170 Estrogen receptor positive status [ER+]: Secondary | ICD-10-CM | POA: Insufficient documentation

## 2019-07-30 DIAGNOSIS — C50412 Malignant neoplasm of upper-outer quadrant of left female breast: Secondary | ICD-10-CM | POA: Diagnosis not present

## 2019-07-30 DIAGNOSIS — Z510 Encounter for antineoplastic radiation therapy: Secondary | ICD-10-CM | POA: Diagnosis not present

## 2019-07-31 ENCOUNTER — Other Ambulatory Visit: Payer: Self-pay | Admitting: *Deleted

## 2019-07-31 DIAGNOSIS — D0511 Intraductal carcinoma in situ of right breast: Secondary | ICD-10-CM | POA: Diagnosis not present

## 2019-07-31 DIAGNOSIS — Z510 Encounter for antineoplastic radiation therapy: Secondary | ICD-10-CM | POA: Diagnosis not present

## 2019-07-31 DIAGNOSIS — Z170 Estrogen receptor positive status [ER+]: Secondary | ICD-10-CM | POA: Diagnosis not present

## 2019-07-31 DIAGNOSIS — C50412 Malignant neoplasm of upper-outer quadrant of left female breast: Secondary | ICD-10-CM

## 2019-08-06 ENCOUNTER — Ambulatory Visit
Admission: RE | Admit: 2019-08-06 | Discharge: 2019-08-06 | Disposition: A | Discharge status: Home / Self Care | Payer: Medicare Other | Source: Ambulatory Visit | Attending: Radiation Oncology | Admitting: Radiation Oncology

## 2019-08-06 ENCOUNTER — Other Ambulatory Visit: Payer: Self-pay

## 2019-08-06 DIAGNOSIS — Z510 Encounter for antineoplastic radiation therapy: Secondary | ICD-10-CM | POA: Insufficient documentation

## 2019-08-06 DIAGNOSIS — C50412 Malignant neoplasm of upper-outer quadrant of left female breast: Secondary | ICD-10-CM | POA: Insufficient documentation

## 2019-08-06 DIAGNOSIS — D0511 Intraductal carcinoma in situ of right breast: Secondary | ICD-10-CM | POA: Diagnosis not present

## 2019-08-06 DIAGNOSIS — Z170 Estrogen receptor positive status [ER+]: Secondary | ICD-10-CM | POA: Diagnosis not present

## 2019-08-10 ENCOUNTER — Ambulatory Visit
Admission: RE | Admit: 2019-08-10 | Discharge: 2019-08-10 | Disposition: A | Discharge status: Home / Self Care | Payer: Medicare Other | Source: Ambulatory Visit | Attending: Radiation Oncology | Admitting: Radiation Oncology

## 2019-08-10 ENCOUNTER — Other Ambulatory Visit: Payer: Self-pay

## 2019-08-10 DIAGNOSIS — D0511 Intraductal carcinoma in situ of right breast: Secondary | ICD-10-CM | POA: Diagnosis not present

## 2019-08-10 DIAGNOSIS — C50412 Malignant neoplasm of upper-outer quadrant of left female breast: Secondary | ICD-10-CM | POA: Diagnosis not present

## 2019-08-10 DIAGNOSIS — Z510 Encounter for antineoplastic radiation therapy: Secondary | ICD-10-CM | POA: Diagnosis not present

## 2019-08-10 DIAGNOSIS — Z170 Estrogen receptor positive status [ER+]: Secondary | ICD-10-CM | POA: Diagnosis not present

## 2019-08-11 ENCOUNTER — Ambulatory Visit
Admission: RE | Admit: 2019-08-11 | Discharge: 2019-08-11 | Disposition: A | Discharge status: Home / Self Care | Payer: Medicare Other | Source: Ambulatory Visit | Attending: Radiation Oncology | Admitting: Radiation Oncology

## 2019-08-11 ENCOUNTER — Other Ambulatory Visit: Payer: Self-pay

## 2019-08-11 DIAGNOSIS — Z510 Encounter for antineoplastic radiation therapy: Secondary | ICD-10-CM | POA: Diagnosis not present

## 2019-08-11 DIAGNOSIS — C50412 Malignant neoplasm of upper-outer quadrant of left female breast: Secondary | ICD-10-CM | POA: Diagnosis not present

## 2019-08-11 DIAGNOSIS — Z170 Estrogen receptor positive status [ER+]: Secondary | ICD-10-CM | POA: Diagnosis not present

## 2019-08-11 DIAGNOSIS — D0511 Intraductal carcinoma in situ of right breast: Secondary | ICD-10-CM | POA: Diagnosis not present

## 2019-08-12 ENCOUNTER — Ambulatory Visit
Admission: RE | Admit: 2019-08-12 | Discharge: 2019-08-12 | Disposition: A | Discharge status: Home / Self Care | Payer: Medicare Other | Source: Ambulatory Visit | Attending: Radiation Oncology | Admitting: Radiation Oncology

## 2019-08-12 ENCOUNTER — Other Ambulatory Visit: Payer: Self-pay

## 2019-08-12 DIAGNOSIS — D0511 Intraductal carcinoma in situ of right breast: Secondary | ICD-10-CM | POA: Diagnosis not present

## 2019-08-12 DIAGNOSIS — C50412 Malignant neoplasm of upper-outer quadrant of left female breast: Secondary | ICD-10-CM | POA: Diagnosis not present

## 2019-08-12 DIAGNOSIS — Z170 Estrogen receptor positive status [ER+]: Secondary | ICD-10-CM | POA: Diagnosis not present

## 2019-08-12 DIAGNOSIS — Z510 Encounter for antineoplastic radiation therapy: Secondary | ICD-10-CM | POA: Diagnosis not present

## 2019-08-13 ENCOUNTER — Other Ambulatory Visit: Payer: Self-pay

## 2019-08-13 ENCOUNTER — Ambulatory Visit
Admission: RE | Admit: 2019-08-13 | Discharge: 2019-08-13 | Disposition: A | Discharge status: Home / Self Care | Payer: Medicare Other | Source: Ambulatory Visit | Attending: Radiation Oncology | Admitting: Radiation Oncology

## 2019-08-13 DIAGNOSIS — Z510 Encounter for antineoplastic radiation therapy: Secondary | ICD-10-CM | POA: Diagnosis not present

## 2019-08-13 DIAGNOSIS — D0511 Intraductal carcinoma in situ of right breast: Secondary | ICD-10-CM | POA: Diagnosis not present

## 2019-08-13 DIAGNOSIS — Z170 Estrogen receptor positive status [ER+]: Secondary | ICD-10-CM | POA: Diagnosis not present

## 2019-08-13 DIAGNOSIS — C50412 Malignant neoplasm of upper-outer quadrant of left female breast: Secondary | ICD-10-CM | POA: Diagnosis not present

## 2019-08-14 ENCOUNTER — Other Ambulatory Visit: Payer: Self-pay

## 2019-08-14 ENCOUNTER — Ambulatory Visit
Admission: RE | Admit: 2019-08-14 | Discharge: 2019-08-14 | Disposition: A | Discharge status: Home / Self Care | Payer: Medicare Other | Source: Ambulatory Visit | Attending: Radiation Oncology | Admitting: Radiation Oncology

## 2019-08-14 DIAGNOSIS — C50412 Malignant neoplasm of upper-outer quadrant of left female breast: Secondary | ICD-10-CM | POA: Diagnosis not present

## 2019-08-14 DIAGNOSIS — Z170 Estrogen receptor positive status [ER+]: Secondary | ICD-10-CM | POA: Diagnosis not present

## 2019-08-14 DIAGNOSIS — Z510 Encounter for antineoplastic radiation therapy: Secondary | ICD-10-CM | POA: Diagnosis not present

## 2019-08-14 DIAGNOSIS — D0511 Intraductal carcinoma in situ of right breast: Secondary | ICD-10-CM | POA: Diagnosis not present

## 2019-08-17 ENCOUNTER — Ambulatory Visit
Admission: RE | Admit: 2019-08-17 | Discharge: 2019-08-17 | Disposition: A | Discharge status: Home / Self Care | Payer: Medicare Other | Source: Ambulatory Visit | Attending: Radiation Oncology | Admitting: Radiation Oncology

## 2019-08-17 ENCOUNTER — Other Ambulatory Visit: Payer: Self-pay

## 2019-08-17 DIAGNOSIS — D0511 Intraductal carcinoma in situ of right breast: Secondary | ICD-10-CM | POA: Diagnosis not present

## 2019-08-17 DIAGNOSIS — Z510 Encounter for antineoplastic radiation therapy: Secondary | ICD-10-CM | POA: Diagnosis not present

## 2019-08-17 DIAGNOSIS — Z170 Estrogen receptor positive status [ER+]: Secondary | ICD-10-CM | POA: Diagnosis not present

## 2019-08-17 DIAGNOSIS — C50412 Malignant neoplasm of upper-outer quadrant of left female breast: Secondary | ICD-10-CM | POA: Diagnosis not present

## 2019-08-18 ENCOUNTER — Other Ambulatory Visit: Payer: Self-pay

## 2019-08-18 ENCOUNTER — Ambulatory Visit
Admission: RE | Admit: 2019-08-18 | Discharge: 2019-08-18 | Disposition: A | Discharge status: Home / Self Care | Payer: Medicare Other | Source: Ambulatory Visit | Attending: Radiation Oncology | Admitting: Radiation Oncology

## 2019-08-18 DIAGNOSIS — D0511 Intraductal carcinoma in situ of right breast: Secondary | ICD-10-CM | POA: Diagnosis not present

## 2019-08-18 DIAGNOSIS — Z170 Estrogen receptor positive status [ER+]: Secondary | ICD-10-CM | POA: Diagnosis not present

## 2019-08-18 DIAGNOSIS — Z510 Encounter for antineoplastic radiation therapy: Secondary | ICD-10-CM | POA: Diagnosis not present

## 2019-08-18 DIAGNOSIS — C50412 Malignant neoplasm of upper-outer quadrant of left female breast: Secondary | ICD-10-CM | POA: Diagnosis not present

## 2019-08-19 ENCOUNTER — Inpatient Hospital Stay: Payer: Medicare Other | Attending: Radiation Oncology

## 2019-08-19 ENCOUNTER — Other Ambulatory Visit: Payer: Self-pay

## 2019-08-19 ENCOUNTER — Ambulatory Visit
Admission: RE | Admit: 2019-08-19 | Discharge: 2019-08-19 | Disposition: A | Discharge status: Home / Self Care | Payer: Medicare Other | Source: Ambulatory Visit | Attending: Radiation Oncology | Admitting: Radiation Oncology

## 2019-08-19 DIAGNOSIS — Z170 Estrogen receptor positive status [ER+]: Secondary | ICD-10-CM | POA: Diagnosis not present

## 2019-08-19 DIAGNOSIS — C50412 Malignant neoplasm of upper-outer quadrant of left female breast: Secondary | ICD-10-CM | POA: Diagnosis not present

## 2019-08-19 DIAGNOSIS — D0511 Intraductal carcinoma in situ of right breast: Secondary | ICD-10-CM | POA: Diagnosis not present

## 2019-08-19 DIAGNOSIS — Z510 Encounter for antineoplastic radiation therapy: Secondary | ICD-10-CM | POA: Diagnosis not present

## 2019-08-19 LAB — CBC
HCT: 38.3 % (ref 36.0–46.0)
Hemoglobin: 12.4 g/dL (ref 12.0–15.0)
MCH: 29.7 pg (ref 26.0–34.0)
MCHC: 32.4 g/dL (ref 30.0–36.0)
MCV: 91.8 fL (ref 80.0–100.0)
Platelets: 254 10*3/uL (ref 150–400)
RBC: 4.17 MIL/uL (ref 3.87–5.11)
RDW: 12.5 % (ref 11.5–15.5)
WBC: 5.7 10*3/uL (ref 4.0–10.5)
nRBC: 0 % (ref 0.0–0.2)

## 2019-08-20 ENCOUNTER — Ambulatory Visit
Admission: RE | Admit: 2019-08-20 | Discharge: 2019-08-20 | Disposition: A | Discharge status: Home / Self Care | Payer: Medicare Other | Source: Ambulatory Visit | Attending: Radiation Oncology | Admitting: Radiation Oncology

## 2019-08-20 ENCOUNTER — Other Ambulatory Visit: Payer: Self-pay

## 2019-08-20 DIAGNOSIS — Z510 Encounter for antineoplastic radiation therapy: Secondary | ICD-10-CM | POA: Diagnosis not present

## 2019-08-20 DIAGNOSIS — D0511 Intraductal carcinoma in situ of right breast: Secondary | ICD-10-CM | POA: Diagnosis not present

## 2019-08-20 DIAGNOSIS — C50412 Malignant neoplasm of upper-outer quadrant of left female breast: Secondary | ICD-10-CM | POA: Diagnosis not present

## 2019-08-20 DIAGNOSIS — Z170 Estrogen receptor positive status [ER+]: Secondary | ICD-10-CM | POA: Diagnosis not present

## 2019-08-21 ENCOUNTER — Ambulatory Visit
Admission: RE | Admit: 2019-08-21 | Discharge: 2019-08-21 | Disposition: A | Discharge status: Home / Self Care | Payer: Medicare Other | Source: Ambulatory Visit | Attending: Radiation Oncology | Admitting: Radiation Oncology

## 2019-08-21 ENCOUNTER — Other Ambulatory Visit: Payer: Self-pay

## 2019-08-21 DIAGNOSIS — D0511 Intraductal carcinoma in situ of right breast: Secondary | ICD-10-CM | POA: Diagnosis not present

## 2019-08-21 DIAGNOSIS — Z170 Estrogen receptor positive status [ER+]: Secondary | ICD-10-CM | POA: Diagnosis not present

## 2019-08-21 DIAGNOSIS — C50412 Malignant neoplasm of upper-outer quadrant of left female breast: Secondary | ICD-10-CM | POA: Diagnosis not present

## 2019-08-21 DIAGNOSIS — Z510 Encounter for antineoplastic radiation therapy: Secondary | ICD-10-CM | POA: Diagnosis not present

## 2019-08-24 ENCOUNTER — Ambulatory Visit
Admission: RE | Admit: 2019-08-24 | Discharge: 2019-08-24 | Disposition: A | Discharge status: Home / Self Care | Payer: Medicare Other | Source: Ambulatory Visit | Attending: Radiation Oncology | Admitting: Radiation Oncology

## 2019-08-24 ENCOUNTER — Other Ambulatory Visit: Payer: Self-pay

## 2019-08-24 DIAGNOSIS — Z170 Estrogen receptor positive status [ER+]: Secondary | ICD-10-CM | POA: Diagnosis not present

## 2019-08-24 DIAGNOSIS — Z510 Encounter for antineoplastic radiation therapy: Secondary | ICD-10-CM | POA: Diagnosis not present

## 2019-08-24 DIAGNOSIS — C50412 Malignant neoplasm of upper-outer quadrant of left female breast: Secondary | ICD-10-CM | POA: Diagnosis not present

## 2019-08-24 DIAGNOSIS — D0511 Intraductal carcinoma in situ of right breast: Secondary | ICD-10-CM | POA: Diagnosis not present

## 2019-08-25 ENCOUNTER — Ambulatory Visit
Admission: RE | Admit: 2019-08-25 | Discharge: 2019-08-25 | Disposition: A | Discharge status: Home / Self Care | Payer: Medicare Other | Source: Ambulatory Visit | Attending: Radiation Oncology | Admitting: Radiation Oncology

## 2019-08-25 ENCOUNTER — Other Ambulatory Visit: Payer: Self-pay

## 2019-08-25 DIAGNOSIS — Z510 Encounter for antineoplastic radiation therapy: Secondary | ICD-10-CM | POA: Diagnosis not present

## 2019-08-25 DIAGNOSIS — Z170 Estrogen receptor positive status [ER+]: Secondary | ICD-10-CM | POA: Diagnosis not present

## 2019-08-25 DIAGNOSIS — C50412 Malignant neoplasm of upper-outer quadrant of left female breast: Secondary | ICD-10-CM | POA: Diagnosis not present

## 2019-08-25 DIAGNOSIS — D0511 Intraductal carcinoma in situ of right breast: Secondary | ICD-10-CM | POA: Diagnosis not present

## 2019-08-26 ENCOUNTER — Other Ambulatory Visit: Payer: Self-pay

## 2019-08-26 ENCOUNTER — Ambulatory Visit
Admission: RE | Admit: 2019-08-26 | Discharge: 2019-08-26 | Disposition: A | Discharge status: Home / Self Care | Payer: Medicare Other | Source: Ambulatory Visit | Attending: Radiation Oncology | Admitting: Radiation Oncology

## 2019-08-26 DIAGNOSIS — D0511 Intraductal carcinoma in situ of right breast: Secondary | ICD-10-CM | POA: Diagnosis not present

## 2019-08-26 DIAGNOSIS — Z170 Estrogen receptor positive status [ER+]: Secondary | ICD-10-CM | POA: Diagnosis not present

## 2019-08-26 DIAGNOSIS — C50412 Malignant neoplasm of upper-outer quadrant of left female breast: Secondary | ICD-10-CM | POA: Diagnosis not present

## 2019-08-26 DIAGNOSIS — Z510 Encounter for antineoplastic radiation therapy: Secondary | ICD-10-CM | POA: Diagnosis not present

## 2019-08-27 ENCOUNTER — Other Ambulatory Visit: Payer: Self-pay

## 2019-08-27 ENCOUNTER — Ambulatory Visit
Admission: RE | Admit: 2019-08-27 | Discharge: 2019-08-27 | Disposition: A | Discharge status: Home / Self Care | Payer: Medicare Other | Source: Ambulatory Visit | Attending: Radiation Oncology | Admitting: Radiation Oncology

## 2019-08-27 DIAGNOSIS — Z170 Estrogen receptor positive status [ER+]: Secondary | ICD-10-CM | POA: Diagnosis not present

## 2019-08-27 DIAGNOSIS — D0511 Intraductal carcinoma in situ of right breast: Secondary | ICD-10-CM | POA: Diagnosis not present

## 2019-08-27 DIAGNOSIS — Z510 Encounter for antineoplastic radiation therapy: Secondary | ICD-10-CM | POA: Diagnosis not present

## 2019-08-27 DIAGNOSIS — C50412 Malignant neoplasm of upper-outer quadrant of left female breast: Secondary | ICD-10-CM | POA: Diagnosis not present

## 2019-08-28 ENCOUNTER — Other Ambulatory Visit: Payer: Self-pay

## 2019-08-28 ENCOUNTER — Ambulatory Visit
Admission: RE | Admit: 2019-08-28 | Discharge: 2019-08-28 | Disposition: A | Discharge status: Home / Self Care | Payer: Medicare Other | Source: Ambulatory Visit | Attending: Radiation Oncology | Admitting: Radiation Oncology

## 2019-08-28 ENCOUNTER — Other Ambulatory Visit: Payer: Self-pay | Admitting: Internal Medicine

## 2019-08-28 DIAGNOSIS — C50412 Malignant neoplasm of upper-outer quadrant of left female breast: Secondary | ICD-10-CM | POA: Diagnosis not present

## 2019-08-28 DIAGNOSIS — Z170 Estrogen receptor positive status [ER+]: Secondary | ICD-10-CM | POA: Diagnosis not present

## 2019-08-28 DIAGNOSIS — Z510 Encounter for antineoplastic radiation therapy: Secondary | ICD-10-CM | POA: Diagnosis not present

## 2019-08-28 DIAGNOSIS — I10 Essential (primary) hypertension: Secondary | ICD-10-CM

## 2019-08-28 DIAGNOSIS — D0511 Intraductal carcinoma in situ of right breast: Secondary | ICD-10-CM | POA: Diagnosis not present

## 2019-08-28 NOTE — Telephone Encounter (Signed)
Medication Refill - Medication: telmisartan (MICARDIS) 40 MG tablet 30 day supply to get there through to her appt on 09/15/19  Has the patient contacted their pharmacy? Yes.   (Agent: If no, request that the patient contact the pharmacy for the refill.) (Agent: If yes, when and what did the pharmacy advise?)  Preferred Pharmacy (with phone number or street name): [id number] - [place] [address], [place] - [place] [phone] (Phone) [phone] (Fax)     Agent: Please be advised that RX refills may take up to 3 business days. We ask that you follow-up with your pharmacy.

## 2019-08-28 NOTE — Telephone Encounter (Signed)
Request no not needed as pharmacy is getting script ready for pick up

## 2019-08-31 ENCOUNTER — Ambulatory Visit
Admission: RE | Admit: 2019-08-31 | Discharge: 2019-08-31 | Disposition: A | Discharge status: Home / Self Care | Payer: Medicare Other | Source: Ambulatory Visit | Attending: Radiation Oncology | Admitting: Radiation Oncology

## 2019-08-31 ENCOUNTER — Other Ambulatory Visit: Payer: Self-pay

## 2019-08-31 DIAGNOSIS — D0511 Intraductal carcinoma in situ of right breast: Secondary | ICD-10-CM | POA: Diagnosis not present

## 2019-08-31 DIAGNOSIS — Z510 Encounter for antineoplastic radiation therapy: Secondary | ICD-10-CM | POA: Diagnosis not present

## 2019-08-31 DIAGNOSIS — C50412 Malignant neoplasm of upper-outer quadrant of left female breast: Secondary | ICD-10-CM | POA: Diagnosis not present

## 2019-08-31 DIAGNOSIS — Z170 Estrogen receptor positive status [ER+]: Secondary | ICD-10-CM | POA: Diagnosis not present

## 2019-09-01 ENCOUNTER — Ambulatory Visit
Admission: RE | Admit: 2019-09-01 | Discharge: 2019-09-01 | Disposition: A | Discharge status: Home / Self Care | Payer: Medicare Other | Source: Ambulatory Visit | Attending: Radiation Oncology | Admitting: Radiation Oncology

## 2019-09-01 ENCOUNTER — Other Ambulatory Visit: Payer: Self-pay

## 2019-09-01 DIAGNOSIS — Z170 Estrogen receptor positive status [ER+]: Secondary | ICD-10-CM | POA: Diagnosis not present

## 2019-09-01 DIAGNOSIS — C50412 Malignant neoplasm of upper-outer quadrant of left female breast: Secondary | ICD-10-CM | POA: Diagnosis not present

## 2019-09-01 DIAGNOSIS — D0511 Intraductal carcinoma in situ of right breast: Secondary | ICD-10-CM | POA: Diagnosis not present

## 2019-09-01 DIAGNOSIS — Z510 Encounter for antineoplastic radiation therapy: Secondary | ICD-10-CM | POA: Diagnosis not present

## 2019-09-02 ENCOUNTER — Inpatient Hospital Stay: Payer: Medicare Other

## 2019-09-02 ENCOUNTER — Ambulatory Visit
Admission: RE | Admit: 2019-09-02 | Discharge: 2019-09-02 | Disposition: A | Discharge status: Home / Self Care | Payer: Medicare Other | Source: Ambulatory Visit | Attending: Radiation Oncology | Admitting: Radiation Oncology

## 2019-09-02 ENCOUNTER — Other Ambulatory Visit: Payer: Self-pay

## 2019-09-02 DIAGNOSIS — Z170 Estrogen receptor positive status [ER+]: Secondary | ICD-10-CM | POA: Diagnosis not present

## 2019-09-02 DIAGNOSIS — D0511 Intraductal carcinoma in situ of right breast: Secondary | ICD-10-CM | POA: Diagnosis not present

## 2019-09-02 DIAGNOSIS — C50412 Malignant neoplasm of upper-outer quadrant of left female breast: Secondary | ICD-10-CM | POA: Diagnosis not present

## 2019-09-02 DIAGNOSIS — Z510 Encounter for antineoplastic radiation therapy: Secondary | ICD-10-CM | POA: Diagnosis not present

## 2019-09-02 LAB — CBC WITH DIFFERENTIAL/PLATELET
Abs Immature Granulocytes: 0.02 10*3/uL (ref 0.00–0.07)
Basophils Absolute: 0 10*3/uL (ref 0.0–0.1)
Basophils Relative: 0 %
Eosinophils Absolute: 0.2 10*3/uL (ref 0.0–0.5)
Eosinophils Relative: 4 %
HCT: 38.2 % (ref 36.0–46.0)
Hemoglobin: 12.2 g/dL (ref 12.0–15.0)
Immature Granulocytes: 0 %
Lymphocytes Relative: 12 %
Lymphs Abs: 0.6 10*3/uL — ABNORMAL LOW (ref 0.7–4.0)
MCH: 29.5 pg (ref 26.0–34.0)
MCHC: 31.9 g/dL (ref 30.0–36.0)
MCV: 92.3 fL (ref 80.0–100.0)
Monocytes Absolute: 0.6 10*3/uL (ref 0.1–1.0)
Monocytes Relative: 11 %
Neutro Abs: 3.9 10*3/uL (ref 1.7–7.7)
Neutrophils Relative %: 73 %
Platelets: 214 10*3/uL (ref 150–400)
RBC: 4.14 MIL/uL (ref 3.87–5.11)
RDW: 12.6 % (ref 11.5–15.5)
WBC: 5.4 10*3/uL (ref 4.0–10.5)
nRBC: 0 % (ref 0.0–0.2)

## 2019-09-02 LAB — COMPREHENSIVE METABOLIC PANEL
ALT: 13 U/L (ref 0–44)
AST: 18 U/L (ref 15–41)
Albumin: 4 g/dL (ref 3.5–5.0)
Alkaline Phosphatase: 79 U/L (ref 38–126)
Anion gap: 10 (ref 5–15)
BUN: 24 mg/dL — ABNORMAL HIGH (ref 8–23)
CO2: 27 mmol/L (ref 22–32)
Calcium: 8.9 mg/dL (ref 8.9–10.3)
Chloride: 104 mmol/L (ref 98–111)
Creatinine, Ser: 0.83 mg/dL (ref 0.44–1.00)
GFR calc Af Amer: >60 mL/min (ref >60)
GFR calc non Af Amer: >60 mL/min (ref >60)
Glucose, Bld: 114 mg/dL — ABNORMAL HIGH (ref 70–99)
Potassium: 3.8 mmol/L (ref 3.5–5.1)
Sodium: 141 mmol/L (ref 135–145)
Total Bilirubin: 0.3 mg/dL (ref 0.3–1.2)
Total Protein: 6.9 g/dL (ref 6.5–8.1)

## 2019-09-03 ENCOUNTER — Ambulatory Visit
Admission: RE | Admit: 2019-09-03 | Discharge: 2019-09-03 | Disposition: A | Discharge status: Home / Self Care | Payer: Medicare Other | Source: Ambulatory Visit | Attending: Radiation Oncology | Admitting: Radiation Oncology

## 2019-09-03 ENCOUNTER — Other Ambulatory Visit: Payer: Self-pay

## 2019-09-03 DIAGNOSIS — Z170 Estrogen receptor positive status [ER+]: Secondary | ICD-10-CM | POA: Diagnosis not present

## 2019-09-03 DIAGNOSIS — Z510 Encounter for antineoplastic radiation therapy: Secondary | ICD-10-CM | POA: Diagnosis not present

## 2019-09-03 DIAGNOSIS — C50412 Malignant neoplasm of upper-outer quadrant of left female breast: Secondary | ICD-10-CM | POA: Diagnosis not present

## 2019-09-03 DIAGNOSIS — D0511 Intraductal carcinoma in situ of right breast: Secondary | ICD-10-CM | POA: Diagnosis not present

## 2019-09-04 ENCOUNTER — Other Ambulatory Visit: Payer: Self-pay

## 2019-09-04 ENCOUNTER — Ambulatory Visit
Admission: RE | Admit: 2019-09-04 | Discharge: 2019-09-04 | Disposition: A | Discharge status: Home / Self Care | Payer: Medicare Other | Source: Ambulatory Visit | Attending: Radiation Oncology | Admitting: Radiation Oncology

## 2019-09-04 DIAGNOSIS — C50412 Malignant neoplasm of upper-outer quadrant of left female breast: Secondary | ICD-10-CM | POA: Diagnosis not present

## 2019-09-04 DIAGNOSIS — Z510 Encounter for antineoplastic radiation therapy: Secondary | ICD-10-CM | POA: Diagnosis not present

## 2019-09-04 DIAGNOSIS — D0511 Intraductal carcinoma in situ of right breast: Secondary | ICD-10-CM | POA: Diagnosis not present

## 2019-09-04 DIAGNOSIS — Z170 Estrogen receptor positive status [ER+]: Secondary | ICD-10-CM | POA: Diagnosis not present

## 2019-09-07 ENCOUNTER — Ambulatory Visit
Admission: RE | Admit: 2019-09-07 | Discharge: 2019-09-07 | Disposition: A | Discharge status: Home / Self Care | Payer: Medicare Other | Source: Ambulatory Visit | Attending: Radiation Oncology | Admitting: Radiation Oncology

## 2019-09-07 ENCOUNTER — Other Ambulatory Visit: Payer: Self-pay

## 2019-09-07 DIAGNOSIS — C50412 Malignant neoplasm of upper-outer quadrant of left female breast: Secondary | ICD-10-CM | POA: Insufficient documentation

## 2019-09-07 DIAGNOSIS — Z510 Encounter for antineoplastic radiation therapy: Secondary | ICD-10-CM | POA: Diagnosis not present

## 2019-09-07 DIAGNOSIS — Z170 Estrogen receptor positive status [ER+]: Secondary | ICD-10-CM | POA: Diagnosis not present

## 2019-09-07 DIAGNOSIS — D0511 Intraductal carcinoma in situ of right breast: Secondary | ICD-10-CM | POA: Diagnosis not present

## 2019-09-08 ENCOUNTER — Other Ambulatory Visit: Payer: Self-pay

## 2019-09-08 ENCOUNTER — Ambulatory Visit
Admission: RE | Admit: 2019-09-08 | Discharge: 2019-09-08 | Disposition: A | Discharge status: Home / Self Care | Payer: Medicare Other | Source: Ambulatory Visit | Attending: Radiation Oncology | Admitting: Radiation Oncology

## 2019-09-08 DIAGNOSIS — C50412 Malignant neoplasm of upper-outer quadrant of left female breast: Secondary | ICD-10-CM

## 2019-09-08 DIAGNOSIS — Z170 Estrogen receptor positive status [ER+]: Secondary | ICD-10-CM | POA: Diagnosis not present

## 2019-09-08 DIAGNOSIS — Z510 Encounter for antineoplastic radiation therapy: Secondary | ICD-10-CM | POA: Diagnosis not present

## 2019-09-08 DIAGNOSIS — D0511 Intraductal carcinoma in situ of right breast: Secondary | ICD-10-CM | POA: Diagnosis not present

## 2019-09-09 ENCOUNTER — Other Ambulatory Visit: Payer: Self-pay

## 2019-09-09 ENCOUNTER — Telehealth: Payer: Self-pay | Admitting: Internal Medicine

## 2019-09-09 ENCOUNTER — Ambulatory Visit
Admission: RE | Admit: 2019-09-09 | Discharge: 2019-09-09 | Disposition: A | Discharge status: Home / Self Care | Payer: Medicare Other | Source: Ambulatory Visit | Attending: Radiation Oncology | Admitting: Radiation Oncology

## 2019-09-09 DIAGNOSIS — C50412 Malignant neoplasm of upper-outer quadrant of left female breast: Secondary | ICD-10-CM | POA: Diagnosis not present

## 2019-09-09 DIAGNOSIS — Z170 Estrogen receptor positive status [ER+]: Secondary | ICD-10-CM | POA: Diagnosis not present

## 2019-09-09 DIAGNOSIS — Z510 Encounter for antineoplastic radiation therapy: Secondary | ICD-10-CM | POA: Diagnosis not present

## 2019-09-09 DIAGNOSIS — D0511 Intraductal carcinoma in situ of right breast: Secondary | ICD-10-CM | POA: Diagnosis not present

## 2019-09-09 NOTE — Telephone Encounter (Signed)
We can discuss at appt but likely needs to ask cancer doctor if ok to get before getting at pharmacy

## 2019-09-09 NOTE — Telephone Encounter (Signed)
Pt would like to get a shingles shot when she comes in on 11/06 can she get it? And does it need to have auth? Please advise and Thank you!

## 2019-09-09 NOTE — Telephone Encounter (Signed)
Notified patient she would need to attain script from PCP and to get shingles vaccine at pharmacy. Patient says he would like to get script at her appointment on Friday. FYI

## 2019-09-10 ENCOUNTER — Other Ambulatory Visit: Payer: Self-pay

## 2019-09-10 ENCOUNTER — Ambulatory Visit
Admission: RE | Admit: 2019-09-10 | Discharge: 2019-09-10 | Disposition: A | Discharge status: Home / Self Care | Payer: Medicare Other | Source: Ambulatory Visit | Attending: Radiation Oncology | Admitting: Radiation Oncology

## 2019-09-10 DIAGNOSIS — Z510 Encounter for antineoplastic radiation therapy: Secondary | ICD-10-CM | POA: Diagnosis not present

## 2019-09-10 DIAGNOSIS — D0511 Intraductal carcinoma in situ of right breast: Secondary | ICD-10-CM | POA: Diagnosis not present

## 2019-09-10 DIAGNOSIS — Z170 Estrogen receptor positive status [ER+]: Secondary | ICD-10-CM | POA: Diagnosis not present

## 2019-09-10 DIAGNOSIS — C50412 Malignant neoplasm of upper-outer quadrant of left female breast: Secondary | ICD-10-CM | POA: Diagnosis not present

## 2019-09-11 ENCOUNTER — Other Ambulatory Visit (INDEPENDENT_AMBULATORY_CARE_PROVIDER_SITE_OTHER): Payer: Medicare Other

## 2019-09-11 ENCOUNTER — Other Ambulatory Visit: Payer: Self-pay

## 2019-09-11 DIAGNOSIS — I10 Essential (primary) hypertension: Secondary | ICD-10-CM

## 2019-09-11 DIAGNOSIS — R7303 Prediabetes: Secondary | ICD-10-CM

## 2019-09-11 DIAGNOSIS — Z1389 Encounter for screening for other disorder: Secondary | ICD-10-CM

## 2019-09-11 LAB — COMPREHENSIVE METABOLIC PANEL
ALT: 10 U/L (ref 0–35)
AST: 14 U/L (ref 0–37)
Albumin: 4.4 g/dL (ref 3.5–5.2)
Alkaline Phosphatase: 89 U/L (ref 39–117)
BUN: 21 mg/dL (ref 6–23)
CO2: 28 mEq/L (ref 19–32)
Calcium: 9.5 mg/dL (ref 8.4–10.5)
Chloride: 103 mEq/L (ref 96–112)
Creatinine, Ser: 0.72 mg/dL (ref 0.40–1.20)
GFR: 77.47 mL/min (ref >60.00)
Glucose, Bld: 103 mg/dL — ABNORMAL HIGH (ref 70–99)
Potassium: 4 mEq/L (ref 3.5–5.1)
Sodium: 141 mEq/L (ref 135–145)
Total Bilirubin: 0.4 mg/dL (ref 0.2–1.2)
Total Protein: 7.3 g/dL (ref 6.0–8.3)

## 2019-09-11 LAB — CBC WITH DIFFERENTIAL/PLATELET
Basophils Absolute: 0 10*3/uL (ref 0.0–0.1)
Basophils Relative: 0.9 % (ref 0.0–3.0)
Eosinophils Absolute: 0.3 10*3/uL (ref 0.0–0.7)
Eosinophils Relative: 5.4 % — ABNORMAL HIGH (ref 0.0–5.0)
HCT: 40.4 % (ref 36.0–46.0)
Hemoglobin: 13 g/dL (ref 12.0–15.0)
Lymphocytes Relative: 12.1 % (ref 12.0–46.0)
Lymphs Abs: 0.6 10*3/uL — ABNORMAL LOW (ref 0.7–4.0)
MCHC: 32.2 g/dL (ref 30.0–36.0)
MCV: 92.5 fl (ref 78.0–100.0)
Monocytes Absolute: 0.7 10*3/uL (ref 0.1–1.0)
Monocytes Relative: 12.8 % — ABNORMAL HIGH (ref 3.0–12.0)
Neutro Abs: 3.6 10*3/uL (ref 1.4–7.7)
Neutrophils Relative %: 68.8 % (ref 43.0–77.0)
Platelets: 233 10*3/uL (ref 150.0–400.0)
RBC: 4.37 Mil/uL (ref 3.87–5.11)
RDW: 13.4 % (ref 11.5–15.5)
WBC: 5.2 10*3/uL (ref 4.0–10.5)

## 2019-09-11 LAB — LIPID PANEL
Cholesterol: 239 mg/dL — ABNORMAL HIGH (ref 0–200)
HDL: 72 mg/dL (ref >39.00)
LDL Cholesterol: 142 mg/dL — ABNORMAL HIGH (ref 0–99)
NonHDL: 166.82
Total CHOL/HDL Ratio: 3
Triglycerides: 122 mg/dL (ref 0.0–149.0)
VLDL: 24.4 mg/dL (ref 0.0–40.0)

## 2019-09-11 LAB — HEMOGLOBIN A1C: Hgb A1c MFr Bld: 5.9 % (ref 4.6–6.5)

## 2019-09-11 LAB — TSH: TSH: 3.12 u[IU]/mL (ref 0.35–4.50)

## 2019-09-11 NOTE — Telephone Encounter (Signed)
Patient has been informed.

## 2019-09-12 LAB — URINALYSIS, ROUTINE W REFLEX MICROSCOPIC
Bilirubin Urine: NEGATIVE
Glucose, UA: NEGATIVE
Hgb urine dipstick: NEGATIVE
Ketones, ur: NEGATIVE
Leukocytes,Ua: NEGATIVE
Nitrite: NEGATIVE
Protein, ur: NEGATIVE
Specific Gravity, Urine: 1.021 (ref 1.001–1.03)
pH: 6 (ref 5.0–8.0)

## 2019-09-13 NOTE — Progress Notes (Signed)
[facility]  [address] [place], [place] [address] Phone: [phone]  Fax: [phone]   Clinic Day:  09/14/2019  Referring physician: McLean-Scocuzza, Olivia Mackie *  Chief Complaint: Elaine Price is a 84 y.o. female with stage IA left breast cancer and right breast DCIS who is seen for assessment after radiation and initiation of tamoxifen.   HPI: The patient was last seen in the medical oncology clinic on 07/21/2019. At that time, she was doing well. Exam revealed mild post-operative changes. I discussed Oncotype DX testing. Patient agreed to tamoxifen after radiation. Patient declined Prolia and bisphosphonates.   She was seen by Dr. Baruch Gouty on 07/29/2019 for initial consultation. Dr. Baruch Gouty wanted to treat both breasts with a hypo-fractionated course of treatment over a 3 week span. Her right breast based on the close less than 1 mm DCIS margin with a boost of 1600 and an 8 fractions. Based on low volume of disease in the left breast, he did not think a boost was indicated. CT stimulation was scheduled.   She received breast radiation from 08/06/2019 - 09/10/2019.   Labs on 09/02/2019 revealed hematocrit 38.2, hemoglobin 12.2, platelets 214,000, WBC 5,400.  During the interim, she has felt "tired". She notes after radiation she has been fatigued all the time. She notes having melancholy. Patient would like to know if she can get a shingles vaccine today.  She is scheduled for yearly follow up with her PCP on 09/14/2019.    Past Medical History:  Diagnosis Date  . Anemia    PMH  . Arthritis   . Cancer [facility])    left breast cancer and right abnormal mammogram  . Depression    since death of husband in Feb 05, 2014   . Diverticulosis   . Essential hypertension   . Gallstones   . Gastritis and duodenitis    dounf by CT Nov 2012  . Hyperlipemia   . Pneumonia    hx of  . Seasonal allergies   . Wears glasses     Past Surgical History:   Procedure Laterality Date  . ABDOMINAL HYSTERECTOMY    . ACROMIO-CLAVICULAR JOINT REPAIR Right 07/04/2015   Procedure: ACROMIO-CLAVICULAR JOINT REPAIR;  Surgeon: Tania Ade, MD;  Location: [place];  Service: Orthopedics;  Laterality: Right;  Right open reduction internal fixation clavical with allograft, coracoclaviular reconstruction  . APPENDECTOMY    . BREAST BIOPSY Right 12/28/2018   affirm bx-"x" clip-path pending  . BREAST BIOPSY Left 05/28/2019   Affirm Biopsy- Coil clip- path pending  . BREAST LUMPECTOMY WITH RADIOACTIVE SEED AND SENTINEL LYMPH NODE BIOPSY Left 07/02/2019   Procedure: LEFT BREAST LUMPECTOMY WITH RADIOACTIVE SEED AND LEFT SENTINEL LYMPH NODE BIOPSY;  Surgeon: [name], MD;  Location: [place];  Service: General;  Laterality: Left;  . CHOLECYSTECTOMY  02/06/2011  . CHOLECYSTECTOMY, LAPAROSCOPIC  08/06/2011   gallstones  . COLONOSCOPY  02/18/2006   2 mm rectal polyp, diverticulosis  . ORIF CLAVICULAR FRACTURE Right 07/04/2015   Procedure: OPEN REDUCTION INTERNAL FIXATION (ORIF) CLAVICULAR FRACTURE;  Surgeon: Tania Ade, MD;  Location: [place];  Service: Orthopedics;  Laterality: Right;  . RADIOACTIVE SEED GUIDED EXCISIONAL BREAST BIOPSY Right 07/02/2019   Procedure: RIGHT BREAST  RADIOACTIVE SEED LOCALIZATION EXCISIONAL BIOPSY;  Surgeon: [name], MD;  Location: [place];  Service: General;  Laterality: Right;  . TONSILECTOMY, ADENOIDECTOMY, BILATERAL MYRINGOTOMY AND TUBES     as a child, does not remember date  Family History  Problem Relation Age of Onset  . Stroke Mother   . Heart attack Mother   . Prostate cancer Father   . Lung cancer Brother   . Prostate cancer Brother   . Hypertension Brother   . Colon cancer Neg Hx     Social History:  reports that she has never smoked. She has never used smokeless tobacco. She reports current alcohol use. She reports that she does not use drugs. She denies any known exposure  to radiation or toxins. 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 She is originally from the [place], but lived in [place] for 16 years. She has lived in [place] for 26-27 years.She lives in [place]. Her husband died of cancer 5 years ago. She has 3 children, one who lives in [place], one in [place], and one in [place]. She has 7 grandchildren.  She likes to swim. The patient is alone today.  Allergies:  Allergies  Allergen Reactions  . Lexapro [Escitalopram Oxalate] Itching    Current Medications: Current Outpatient Medications  Medication Sig Dispense Refill  . OVER THE COUNTER MEDICATION Patient states she takes a pill for eyes/vision    . ALOE VERA PO Take 1 Dose by mouth daily as needed (indigestion).    Marland Kitchen OVER THE COUNTER MEDICATION Take 1 tablet by mouth daily. Ultranol otc bladder supplement    . OVER THE COUNTER MEDICATION Take 3 tablets by mouth daily. "blood flow - 7" otc circulation supplement    . telmisartan (MICARDIS) 40 MG tablet Take 1 tablet (40 mg total) by mouth daily. 90 tablet 3   No current facility-administered medications for this visit.     Review of Systems  Constitutional: Positive for malaise/fatigue (s/p radiation) and weight loss (1 pound). Negative for chills, diaphoresis and fever.       Feels "tired".  HENT: Negative.  Negative for congestion, ear pain, hearing loss, nosebleeds, sinus pain and sore throat.   Eyes: Negative.  Negative for blurred vision and double vision.  Respiratory: Negative.  Negative for cough, shortness of breath and wheezing.   Cardiovascular: Negative.  Negative for chest pain, palpitations, orthopnea, leg swelling and PND.  Gastrointestinal: Negative.  Negative for abdominal pain, blood in stool, constipation, diarrhea, melena, nausea and vomiting.  Genitourinary: Negative.  Negative for dysuria, frequency, hematuria and urgency.   Musculoskeletal: Negative.  Negative for back pain, falls, joint pain and myalgias.  Skin: Negative.  Negative for rash.  Neurological: Negative.  Negative for dizziness, tingling, sensory change, speech change, weakness and headaches.  Endo/Heme/Allergies: Negative.  Does not bruise/bleed easily.  Psychiatric/Behavioral: Negative.  Negative for depression, memory loss and substance abuse. The patient is not nervous/anxious and does not have insomnia.        Melancholy.  All other systems reviewed and are negative.  Performance status (ECOG): 0  Vitals Blood pressure (!) 142/75, pulse (!) 102, temperature 98 F (36.7 C), temperature source Tympanic, resp. rate 18, height '5\' 6"'  (1.676 m), weight 149 lb 14.6 oz (68 kg), SpO2 99 %.  Physical Exam  Constitutional: She is oriented to person, place, and time. She appears well-developed and well-nourished. No distress.  She needs some assistance onto the exam room table.  HENT:  Head: Normocephalic and atraumatic.  Mouth/Throat: Oropharynx is clear and moist. No oropharyngeal exudate.  Short gray hair.  Mask.  Eyes: Pupils are equal, round, and reactive to  light. Conjunctivae and EOM are normal. No scleral icterus.  Glasses.  Blue eyes.  Neck: Normal range of motion. Neck supple. No JVD present.  Cardiovascular: Normal rate, regular rhythm and normal heart sounds.  No murmur heard. Pulmonary/Chest: Effort normal and breath sounds normal. No respiratory distress. She has no wheezes. She has no rales. Right breast exhibits no inverted nipple, no mass, no nipple discharge, no skin change and no tenderness. Left breast exhibits no inverted nipple, no mass, no nipple discharge, no skin change (outer quadrant globular) and no tenderness. Breasts are symmetrical.  Radiation erythema to both breasts. RIGHT breast is moist. Skin overlying LEFT breast has some minor break down.  Abdominal: Soft. Bowel sounds are normal. She exhibits no distension and no  mass. There is no abdominal tenderness. There is no rebound and no guarding.  Musculoskeletal: Normal range of motion.        General: No edema.  Lymphadenopathy:       Head (right side): No preauricular, no posterior auricular and no occipital adenopathy present.       Head (left side): No preauricular, no posterior auricular and no occipital adenopathy present.    She has no cervical adenopathy.    She has no axillary adenopathy.       Right: No inguinal and no supraclavicular adenopathy present.       Left: No inguinal and no supraclavicular adenopathy present.  Neurological: She is alert and oriented to person, place, and time.  Skin: Skin is warm and dry. No rash noted. She is not diaphoretic. No pallor.  Psychiatric: She has a normal mood and affect. Her behavior is normal. Judgment and thought content normal.  Nursing note and vitals reviewed.   Appointment on 09/14/2019  Component Date Value Ref Range Status  . WBC 09/14/2019 5.8  4.0 - 10.5 K/uL Final  . RBC 09/14/2019 4.12  3.87 - 5.11 MIL/uL Final  . Hemoglobin 09/14/2019 12.1  12.0 - 15.0 g/dL Final  . HCT 09/14/2019 37.4  36.0 - 46.0 % Final  . MCV 09/14/2019 90.8  80.0 - 100.0 fL Final  . MCH 09/14/2019 29.4  26.0 - 34.0 pg Final  . MCHC 09/14/2019 32.4  30.0 - 36.0 g/dL Final  . RDW 09/14/2019 13.0  11.5 - 15.5 % Final  . Platelets 09/14/2019 218  150 - 400 K/uL Final  . nRBC 09/14/2019 0.0  0.0 - 0.2 % Final  . Neutrophils Relative % 09/14/2019 80  % Final  . Neutro Abs 09/14/2019 4.7  1.7 - 7.7 K/uL Final  . Lymphocytes Relative 09/14/2019 8  % Final  . Lymphs Abs 09/14/2019 0.5* 0.7 - 4.0 K/uL Final  . Monocytes Relative 09/14/2019 8  % Final  . Monocytes Absolute 09/14/2019 0.5  0.1 - 1.0 K/uL Final  . Eosinophils Relative 09/14/2019 2  % Final  . Eosinophils Absolute 09/14/2019 0.1  0.0 - 0.5 K/uL Final  . Basophils Relative 09/14/2019 1  % Final  . Basophils Absolute 09/14/2019 0.0  0.0 - 0.1 K/uL Final  .  Immature Granulocytes 09/14/2019 1  % Final  . Abs Immature Granulocytes 09/14/2019 0.03  0.00 - 0.07 K/uL Final   Performed at [facility], [address], [place], [place] [address]  . Sodium 09/14/2019 135  135 - 145 mmol/L Final  . Potassium 09/14/2019 3.9  3.5 - 5.1 mmol/L Final  . Chloride 09/14/2019 102  98 - 111 mmol/L Final  . CO2 09/14/2019 23  22 -  32 mmol/L Final  . Glucose, Bld 09/14/2019 202* 70 - 99 mg/dL Final  . BUN 09/14/2019 19  8 - 23 mg/dL Final  . Creatinine, Ser 09/14/2019 0.82  0.44 - 1.00 mg/dL Final  . Calcium 09/14/2019 8.3* 8.9 - 10.3 mg/dL Final  . Total Protein 09/14/2019 7.0  6.5 - 8.1 g/dL Final  . Albumin 09/14/2019 3.9  3.5 - 5.0 g/dL Final  . AST 09/14/2019 24  15 - 41 U/L Final  . ALT 09/14/2019 14  0 - 44 U/L Final  . Alkaline Phosphatase 09/14/2019 82  38 - 126 U/L Final  . Total Bilirubin 09/14/2019 0.6  0.3 - 1.2 mg/dL Final  . GFR calc non Af Amer 09/14/2019 >60  >60 mL/min Final  . GFR calc Af Amer 09/14/2019 >60  >60 mL/min Final  . Anion gap 09/14/2019 10  5 - 15 Final   Performed at [facility], [address], [place], [place] [address]    Assessment:  Elaine Price is a 84 y.o. female withstage IA left breast cancer s/p lumpectomy and sentinel lymph node biopsy on 07/02/2019.  Pathology revealed no residual carcinoma (prior biopsy 6 mm).  Three sentinel lymph nodes were negative.  Tumor was ER+ (90%), PR+ (90%), and Her2/neu - (1+).  Pathologic stage was pT1b pN0.    Right breast lumpectomy on 07/02/2019 revealed 1.0 cm intermediate grade DCIS with calcifications associated with carcinoma.  Margins were uninvolved with carcinoma (< 1 mm; superior and lateral).  Pathologic stage was pTis.   She received bilateral breast radiation from 08/06/2019 - 09/10/2019.  Bilateral diagnostic mammogramon 05/21/2019 revealed persistent architectural distortion withan associated mass in the far posterior upper  outer left breast.Ultrasoundrevealed an8 x 7 x 7 mmirregular hypoechoic mass with posterior acoustic shadowing at the 1 o'clock position 9 cm from the nipplein the left breast. There was no left axillaryadenopathy.   Bilateral diagnostic mammogramon 05/21/2019 revealed calcifications spanning 8 x 9 mm in the central right breastof indeterminate morphology.  Right breast biopsyon 05/28/2019 revealed fibroadenomatous changes with stromal hyalinization and calcifications. There was focal atypical ductal hyperplasia.   Bone densityon 05/12/2019 revealed osteoporosiswith a T-score of -3.4 at the forearm radius.  Symptomatically, she is fatigued s/p radiation.  Exam reveals mild radiation changes.  Plan: 1.   Labs today: CBC with diff, CMP. 2.   Stage IA left breast cancer Patient s/p lumpectomy and sentinel lymph node biopsy.  Radiation completed on 09/10/2019.  Discuss plan for endocrine therapy.                         Re-review aromatase inhibitors versus tamoxifen.                         Review potential side effects of each.  Decision made to pursue tamoxifen.  Patient consented.  Rx:  tamoxifen 20 mg a day (dis: #30; 2 refills). 3.   DCIS right breast             Patient s/p lumpectomy.  Radiation completed on 09/10/2019.  Endocrine therapy as above. 4.Osteoporosis Continue calcium and vitamin D. Patient declines Prolia and bisphosphonates. 5. RTC in 1 month for MD assessment and labs (LFTs).   I discussed the assessment and treatment plan with the patient.  The patient was provided an opportunity to ask questions and all were answered.  The patient agreed with the plan and demonstrated an  understanding of the instructions.  The patient was advised to call back if the symptoms worsen or if the condition fails to improve as anticipated.  I provided 14 minutes of face-to-face time during this this encounter and > 50% was spent  counseling as documented under my assessment and plan.    Lequita Asal, MD, PhD    09/14/2019, 2:47 PM  I, Selena Batten, am acting as scribe for [employer]. Mike Gip, MD, PhD.  I, Melissa C. Mike Gip, MD, have reviewed the above documentation for accuracy and completeness, and I agree with the above.

## 2019-09-14 ENCOUNTER — Inpatient Hospital Stay: Payer: Medicare Other | Admitting: Hematology and Oncology

## 2019-09-14 ENCOUNTER — Inpatient Hospital Stay (HOSPITAL_BASED_OUTPATIENT_CLINIC_OR_DEPARTMENT_OTHER): Payer: Medicare Other | Admitting: Hematology and Oncology

## 2019-09-14 ENCOUNTER — Inpatient Hospital Stay: Payer: Medicare Other

## 2019-09-14 ENCOUNTER — Encounter: Payer: Self-pay | Admitting: Hematology and Oncology

## 2019-09-14 ENCOUNTER — Other Ambulatory Visit: Payer: Self-pay

## 2019-09-14 ENCOUNTER — Telehealth: Payer: Self-pay

## 2019-09-14 ENCOUNTER — Inpatient Hospital Stay: Payer: Medicare Other | Attending: Hematology and Oncology

## 2019-09-14 VITALS — BP 142/75 | HR 102 | Temp 98.0°F | Resp 18 | Ht 66.0 in | Wt 149.9 lb

## 2019-09-14 DIAGNOSIS — Z7289 Other problems related to lifestyle: Secondary | ICD-10-CM | POA: Diagnosis not present

## 2019-09-14 DIAGNOSIS — Z923 Personal history of irradiation: Secondary | ICD-10-CM | POA: Diagnosis not present

## 2019-09-14 DIAGNOSIS — D0511 Intraductal carcinoma in situ of right breast: Secondary | ICD-10-CM

## 2019-09-14 DIAGNOSIS — Z170 Estrogen receptor positive status [ER+]: Secondary | ICD-10-CM

## 2019-09-14 DIAGNOSIS — C50412 Malignant neoplasm of upper-outer quadrant of left female breast: Secondary | ICD-10-CM

## 2019-09-14 DIAGNOSIS — M810 Age-related osteoporosis without current pathological fracture: Secondary | ICD-10-CM | POA: Diagnosis not present

## 2019-09-14 LAB — CBC WITH DIFFERENTIAL/PLATELET
Abs Immature Granulocytes: 0.03 10*3/uL (ref 0.00–0.07)
Basophils Absolute: 0 10*3/uL (ref 0.0–0.1)
Basophils Relative: 1 %
Eosinophils Absolute: 0.1 10*3/uL (ref 0.0–0.5)
Eosinophils Relative: 2 %
HCT: 37.4 % (ref 36.0–46.0)
Hemoglobin: 12.1 g/dL (ref 12.0–15.0)
Immature Granulocytes: 1 %
Lymphocytes Relative: 8 %
Lymphs Abs: 0.5 10*3/uL — ABNORMAL LOW (ref 0.7–4.0)
MCH: 29.4 pg (ref 26.0–34.0)
MCHC: 32.4 g/dL (ref 30.0–36.0)
MCV: 90.8 fL (ref 80.0–100.0)
Monocytes Absolute: 0.5 10*3/uL (ref 0.1–1.0)
Monocytes Relative: 8 %
Neutro Abs: 4.7 10*3/uL (ref 1.7–7.7)
Neutrophils Relative %: 80 %
Platelets: 218 10*3/uL (ref 150–400)
RBC: 4.12 MIL/uL (ref 3.87–5.11)
RDW: 13 % (ref 11.5–15.5)
WBC: 5.8 10*3/uL (ref 4.0–10.5)
nRBC: 0 % (ref 0.0–0.2)

## 2019-09-14 LAB — COMPREHENSIVE METABOLIC PANEL
ALT: 14 U/L (ref 0–44)
AST: 24 U/L (ref 15–41)
Albumin: 3.9 g/dL (ref 3.5–5.0)
Alkaline Phosphatase: 82 U/L (ref 38–126)
Anion gap: 10 (ref 5–15)
BUN: 19 mg/dL (ref 8–23)
CO2: 23 mmol/L (ref 22–32)
Calcium: 8.3 mg/dL — ABNORMAL LOW (ref 8.9–10.3)
Chloride: 102 mmol/L (ref 98–111)
Creatinine, Ser: 0.82 mg/dL (ref 0.44–1.00)
GFR calc Af Amer: >60 mL/min (ref >60)
GFR calc non Af Amer: >60 mL/min (ref >60)
Glucose, Bld: 202 mg/dL — ABNORMAL HIGH (ref 70–99)
Potassium: 3.9 mmol/L (ref 3.5–5.1)
Sodium: 135 mmol/L (ref 135–145)
Total Bilirubin: 0.6 mg/dL (ref 0.3–1.2)
Total Protein: 7 g/dL (ref 6.5–8.1)

## 2019-09-14 MED ORDER — TAMOXIFEN CITRATE 20 MG PO TABS: 20.0000 mg | ORAL_TABLET | Freq: Every day | ORAL | 2 refills | Status: AC

## 2019-09-14 NOTE — Telephone Encounter (Signed)
Left a message to see if the patient was taken Calcium. I have informed her to contact the office as soon as possible.

## 2019-09-14 NOTE — Telephone Encounter (Signed)
-----   Message from Lequita Asal, MD sent at 09/14/2019  3:23 PM EST ----- Regarding: Please call patient  Make sure she takes her oral calcium.  M ----- Message ----- From: Buel Ream, [facility] [place] Sent: 09/14/2019   1:29 PM EST To: Lequita Asal, MD

## 2019-09-14 NOTE — Patient Instructions (Signed)
Tamoxifen oral tablet What is this medicine? TAMOXIFEN (ta MOX i fen) blocks the effects of estrogen. It is commonly used to treat breast cancer. It is also used to decrease the chance of breast cancer coming back in women who have received treatment for the disease. It may also help prevent breast cancer in women who have a high risk of developing breast cancer. This medicine may be used for other purposes; ask your health care provider or pharmacist if you have questions. COMMON BRAND NAME(S): Nolvadex What should I tell my health care provider before I take this medicine? They need to know if you have any of these conditions:  blood clots  blood disease  cataracts or impaired eyesight  endometriosis  high calcium levels  high cholesterol  irregular menstrual cycles  liver disease  stroke  uterine fibroids  an unusual reaction to tamoxifen, other medicines, foods, dyes, or preservatives  pregnant or trying to get pregnant  breast-feeding How should I use this medicine? Take this medicine by mouth with a glass of water. Follow the directions on the prescription label. You can take it with or without food. Take your medicine at regular intervals. Do not take your medicine more often than directed. Do not stop taking except on your doctor's advice. A special MedGuide will be given to you by the pharmacist with each prescription and refill. Be sure to read this information carefully each time. Talk to your pediatrician regarding the use of this medicine in children. While this drug may be prescribed for selected conditions, precautions do apply. Overdosage: If you think you have taken too much of this medicine contact a poison control center or emergency room at once. NOTE: This medicine is only for you. Do not share this medicine with others. What if I miss a dose? If you miss a dose, take it as soon as you can. If it is almost time for your next dose, take only that dose. Do  not take double or extra doses. What may interact with this medicine? Do not take this medicine with any of the following medications:  cisapride  certain medicines for irregular heart beat like dronedarone, quinidine  certain medicines for fungal infection like fluconazole, posaconazole  pimozide  saquinavir  thioridazine This medicine may also interact with the following medications:  aminoglutethimide  anastrozole  bromocriptine  chemotherapy drugs  dofetilide  female hormones, like estrogens and birth control pills  letrozole  medroxyprogesterone  phenobarbital  rifampin  warfarin This list may not describe all possible interactions. Give your health care provider a list of all the medicines, herbs, non-prescription drugs, or dietary supplements you use. Also tell them if you smoke, drink alcohol, or use illegal drugs. Some items may interact with your medicine. What should I watch for while using this medicine? Visit your doctor or health care professional for regular checks on your progress. You will need regular pelvic exams, breast exams, and mammograms. If you are taking this medicine to reduce your risk of getting breast cancer, you should know that this medicine does not prevent all types of breast cancer. If breast cancer or other problems occur, there is no guarantee that it will be found at an early stage. Do not become pregnant while taking this medicine or for 2 months after stopping it. Women should inform their doctor if they wish to become pregnant or think they might be pregnant. There is a potential for serious side effects to an unborn child. Talk to your   health care professional or pharmacist for more information. Do not breast-feed an infant while taking this medicine or for 3 months after stopping it. This medicine may interfere with the ability to have a child. Talk with your doctor or health care professional if you are concerned about your  fertility. What side effects may I notice from receiving this medicine? Side effects that you should report to your doctor or health care professional as soon as possible:  allergic reactions like skin rash, itching or hives, swelling of the face, lips, or tongue  changes in vision  changes in your menstrual cycle  difficulty walking or talking  new breast lumps  numbness  pelvic pain or pressure  redness, blistering, peeling or loosening of the skin, including inside the mouth  signs and symptoms of a dangerous change in heartbeat or heart rhythm like chest pain, dizziness, fast or irregular heartbeat, palpitations, feeling faint or lightheaded, falls, breathing problems  sudden chest pain  swelling, pain or tenderness in your calf or leg  unusual bruising or bleeding  vaginal discharge that is bloody, brown, or rust  weakness  yellowing of the whites of the eyes or skin Side effects that usually do not require medical attention (report to your doctor or health care professional if they continue or are bothersome):  fatigue  hair loss, although uncommon and is usually mild  headache  hot flashes  impotence (in men)  nausea, vomiting (mild)  vaginal discharge (white or clear) This list may not describe all possible side effects. Call your doctor for medical advice about side effects. You may report side effects to FDA at 1-800-FDA-1088. Where should I keep my medicine? Keep out of the reach of children. Store at room temperature between 20 and 25 degrees C (68 and 77 degrees F). Protect from light. Keep container tightly closed. Throw away any unused medicine after the expiration date. NOTE: This sheet is a summary. It may not cover all possible information. If you have questions about this medicine, talk to your doctor, pharmacist, or health care provider.  2020 Elsevier/Gold Standard (2018-10-14 11:15:31)  

## 2019-09-15 ENCOUNTER — Encounter: Payer: Self-pay | Admitting: Internal Medicine

## 2019-09-15 ENCOUNTER — Other Ambulatory Visit: Payer: Self-pay

## 2019-09-15 ENCOUNTER — Ambulatory Visit (INDEPENDENT_AMBULATORY_CARE_PROVIDER_SITE_OTHER): Payer: Medicare Other | Admitting: Internal Medicine

## 2019-09-15 VITALS — BP 126/80 | HR 101 | Temp 97.6°F | Ht 66.0 in | Wt 150.4 lb

## 2019-09-15 DIAGNOSIS — I10 Essential (primary) hypertension: Secondary | ICD-10-CM

## 2019-09-15 DIAGNOSIS — F411 Generalized anxiety disorder: Secondary | ICD-10-CM

## 2019-09-15 DIAGNOSIS — E785 Hyperlipidemia, unspecified: Secondary | ICD-10-CM | POA: Diagnosis not present

## 2019-09-15 DIAGNOSIS — R7303 Prediabetes: Secondary | ICD-10-CM | POA: Diagnosis not present

## 2019-09-15 DIAGNOSIS — M810 Age-related osteoporosis without current pathological fracture: Secondary | ICD-10-CM

## 2019-09-15 DIAGNOSIS — H5369 Other night blindness: Secondary | ICD-10-CM | POA: Insufficient documentation

## 2019-09-15 NOTE — Progress Notes (Signed)
Chief Complaint  Patient presents with  . Follow-up   F/u  1. Right breast cancer just complete radiation 09/10/2019 and f/u Dr. Donella Stade, Dr. Barry Dienes appt upcoming 09/21/2019 and Dr. Mike Gip just saw 09/14/2019 on tamoxifen 20 mg qd she does report some skin irritation but used topical creams which helped. Also had seroma which was aspirated after surgery 08/2019 Dr. Barry Dienes  2. HTN on micardis 40 mg qd  3. Osteoporosis DEXA 05/12/19 declines treatment and wants to take supplements  4. hypoCa 8.3 labs 09/14/2019 reviewed with pt she is taking calcium in Algae cal vitamin 370 mg will rec she take 600 mg more over the counter  5. Co reduced night vision tried True vision supplements which helped she has not seen the eye MD since spring 2019 and will call to may appt  6. Reviewed labs prediabetes A1C 5.9 and HLD pt declines statin and will work on diet walking 1-2 miles daily     Review of Systems  Constitutional: Negative for weight loss.  HENT: Negative for hearing loss.   Eyes: Negative for blurred vision.  Respiratory: Negative for shortness of breath.   Cardiovascular: Negative for chest pain.  Gastrointestinal: Negative for abdominal pain.  Musculoskeletal: Negative for falls.  Skin: Negative for rash.  Neurological: Negative for headaches.  Psychiatric/Behavioral: Negative for memory loss.   Past Medical History:  Diagnosis Date  . Anemia    PMH  . Arthritis   . Cancer [facility])    left breast cancer and right abnormal mammogram  . Depression    since death of husband in 01-30-14   . Diverticulosis   . Essential hypertension   . Gallstones   . Gastritis and duodenitis    dounf by CT Nov 2012  . Hyperlipemia   . Pneumonia    hx of  . Seasonal allergies   . Wears glasses    Past Surgical History:  Procedure Laterality Date  . ABDOMINAL HYSTERECTOMY    . ACROMIO-CLAVICULAR JOINT REPAIR Right 07/04/2015   Procedure: ACROMIO-CLAVICULAR JOINT REPAIR;  Surgeon: Tania Ade, MD;   Location: [place];  Service: Orthopedics;  Laterality: Right;  Right open reduction internal fixation clavical with allograft, coracoclaviular reconstruction  . APPENDECTOMY    . BREAST BIOPSY Right 12/28/2018   affirm bx-"x" clip-path pending  . BREAST BIOPSY Left 05/28/2019   Affirm Biopsy- Coil clip- path pending  . BREAST LUMPECTOMY WITH RADIOACTIVE SEED AND SENTINEL LYMPH NODE BIOPSY Left 07/02/2019   Procedure: LEFT BREAST LUMPECTOMY WITH RADIOACTIVE SEED AND LEFT SENTINEL LYMPH NODE BIOPSY;  Surgeon: [name], MD;  Location: [place];  Service: General;  Laterality: Left;  . CHOLECYSTECTOMY  01/31/2011  . CHOLECYSTECTOMY, LAPAROSCOPIC  08/06/2011   gallstones  . COLONOSCOPY  02/18/2006   2 mm rectal polyp, diverticulosis  . ORIF CLAVICULAR FRACTURE Right 07/04/2015   Procedure: OPEN REDUCTION INTERNAL FIXATION (ORIF) CLAVICULAR FRACTURE;  Surgeon: Tania Ade, MD;  Location: [place];  Service: Orthopedics;  Laterality: Right;  . RADIOACTIVE SEED GUIDED EXCISIONAL BREAST BIOPSY Right 07/02/2019   Procedure: RIGHT BREAST  RADIOACTIVE SEED LOCALIZATION EXCISIONAL BIOPSY;  Surgeon: [name], MD;  Location: [place];  Service: General;  Laterality: Right;  . TONSILECTOMY, ADENOIDECTOMY, BILATERAL MYRINGOTOMY AND TUBES     as a child, does not remember date   Family History  Problem Relation Age of Onset  . Stroke Mother   . Heart attack Mother   . Prostate cancer Father   . Lung  cancer Brother   . Prostate cancer Brother   . Hypertension Brother   . Colon cancer Neg Hx    Social History   Socioeconomic History  . Marital status: Widowed    Spouse name: Not on file  . Number of children: Not on file  . Years of education: Not on file  . Highest education level: Not on file  Occupational History  . 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 Financial resource strain: Not hard at all  . Food insecurity    Worry: Never true     Inability: Never true  . Transportation needs    Medical: No    Non-medical: No  Tobacco Use  . Smoking status: Never Smoker  . Smokeless tobacco: Never Used  Substance and Sexual Activity  . Alcohol use: Yes    Comment: rare  . Drug use: No  . Sexual activity: Never  Lifestyle  . Physical activity    Days per week: Not on file    Minutes per session: Not on file  . Stress: Not on file  Relationships  . Social Herbalist on phone: Not on file    Gets together: Not on file    Attends religious service: Not on file    Active member of club or organization: Not on file    Attends meetings of clubs or organizations: Not on file    Relationship status: Not on file  . Intimate partner violence    Fear of current or ex partner: No    Emotionally abused: No    Physically abused: No    Forced sexual activity: No  Other Topics Concern  . Not on file  Social History Narrative   Has children 1 son 58 lives in [place]    1 daughter lives in [place]    Widower husband died in 02-Feb-2014    Lives [place]    Has a dog    Current Meds  Medication Sig  . ALOE VERA PO Take 1 Dose by mouth daily as needed (indigestion).  . NON FORMULARY Algae Cal pulse bone with calcium 370 mg daily  . NON FORMULARY True vision  . OVER THE COUNTER MEDICATION Take 1 tablet by mouth daily. Ultranol otc bladder supplement  . OVER THE COUNTER MEDICATION Take 3 tablets by mouth daily. "blood flow - 7" otc circulation supplement  . OVER THE COUNTER MEDICATION Patient states she takes a pill for eyes/vision  . Strontium Chloride POWD 680 mg by Does not apply route. 680 mg (2 pills) qhs  . tamoxifen (NOLVADEX) 20 MG tablet Take 1 tablet (20 mg total) by mouth daily.  Marland Kitchen telmisartan (MICARDIS) 40 MG tablet Take 1 tablet (40 mg total) by mouth daily.   Allergies  Allergen Reactions  . Lexapro [Escitalopram Oxalate] Itching   Recent Results (from the past 03-Feb-2159 hour(s))  Basic metabolic  panel     Status: Abnormal   Collection Time: 06/25/19  2:08 PM  Result Value Ref Range   Sodium 138 135 - 145 mmol/L   Potassium 4.3 3.5 - 5.1 mmol/L   Chloride 102 98 - 111 mmol/L   CO2 26 22 - 32 mmol/L   Glucose, Bld 108 (H) 70 - 99 mg/dL   BUN 15 8 - 23 mg/dL   Creatinine, Ser 0.81 0.44 - 1.00 mg/dL   Calcium 9.0 8.9 - 10.3 mg/dL   GFR calc non Af Amer >  60 >60 mL/min   GFR calc Af Amer >60 >60 mL/min   Anion gap 10 5 - 15    Comment: Performed at [facility] [address], [place], [place] [address]  CBC     Status: None   Collection Time: 06/25/19  2:08 PM  Result Value Ref Range   WBC 5.5 4.0 - 10.5 K/uL   RBC 4.34 3.87 - 5.11 MIL/uL   Hemoglobin 13.2 12.0 - 15.0 g/dL   HCT 41.4 36.0 - 46.0 %   MCV 95.4 80.0 - 100.0 fL   MCH 30.4 26.0 - 34.0 pg   MCHC 31.9 30.0 - 36.0 g/dL   RDW 12.7 11.5 - 15.5 %   Platelets 307 150 - 400 K/uL   nRBC 0.0 0.0 - 0.2 %    Comment: Performed at [facility], [place] [address], [place], [place] [address]  SARS CORONAVIRUS 2 (TAT 6-12 HRS) Nasal Swab Aptima Multi Swab     Status: None   Collection Time: 06/29/19  9:55 AM   Specimen: Aptima Multi Swab; Nasal Swab  Result Value Ref Range   SARS Coronavirus 2 NEGATIVE NEGATIVE    Comment: (NOTE) SARS-CoV-2 target nucleic acids are NOT DETECTED. The SARS-CoV-2 RNA is generally detectable in upper and lower respiratory specimens during the acute phase of infection. Negative results do not preclude SARS-CoV-2 infection, do not rule out co-infections with other pathogens, and should not be used as the sole basis for treatment or other patient management decisions. Negative results must be combined with clinical observations, patient history, and epidemiological information. The expected result is Negative. Fact Sheet for Patients: SugarRoll.be Fact Sheet for Healthcare Providers: https://www.woods-mathews.com/ This test is not yet approved  or cleared by the [place] FDA and  has been authorized for detection and/or diagnosis of SARS-CoV-2 by FDA under an Emergency Use Authorization (EUA). This EUA will remain  in effect (meaning this test can be used) for the duration of the COVID-19 declaration under Section 56 4(b)(1) of the Act, 21 U.S.C. section 360bbb-3(b)(1), unless the authorization is terminated or revoked sooner. Performed at [facility], [place] [address] [place] [address]   CBC     Status: None   Collection Time: 08/19/19  2:04 PM  Result Value Ref Range   WBC 5.7 4.0 - 10.5 K/uL   RBC 4.17 3.87 - 5.11 MIL/uL   Hemoglobin 12.4 12.0 - 15.0 g/dL   HCT 38.3 36.0 - 46.0 %   MCV 91.8 80.0 - 100.0 fL   MCH 29.7 26.0 - 34.0 pg   MCHC 32.4 30.0 - 36.0 g/dL   RDW 12.5 11.5 - 15.5 %   Platelets 254 150 - 400 K/uL   nRBC 0.0 0.0 - 0.2 %    Comment: Performed at [facility], [address] [place], [place] [address]  CBC with Differential/Platelet     Status: Abnormal   Collection Time: 09/02/19  1:57 PM  Result Value Ref Range   WBC 5.4 4.0 - 10.5 K/uL   RBC 4.14 3.87 - 5.11 MIL/uL   Hemoglobin 12.2 12.0 - 15.0 g/dL   HCT 38.2 36.0 - 46.0 %   MCV 92.3 80.0 - 100.0 fL   MCH 29.5 26.0 - 34.0 pg   MCHC 31.9 30.0 - 36.0 g/dL   RDW 12.6 11.5 - 15.5 %   Platelets 214 150 - 400 K/uL   nRBC 0.0 0.0 - 0.2 %   Neutrophils  Relative % 73 %   Neutro Abs 3.9 1.7 - 7.7 K/uL   Lymphocytes Relative 12 %   Lymphs Abs 0.6 (L) 0.7 - 4.0 K/uL   Monocytes Relative 11 %   Monocytes Absolute 0.6 0.1 - 1.0 K/uL   Eosinophils Relative 4 %   Eosinophils Absolute 0.2 0.0 - 0.5 K/uL   Basophils Relative 0 %   Basophils Absolute 0.0 0.0 - 0.1 K/uL   Immature Granulocytes 0 %   Abs Immature Granulocytes 0.02 0.00 - 0.07 K/uL    Comment: Performed at [facility], [address] [place], [place] [address]  Comprehensive metabolic panel     Status: Abnormal   Collection Time: 09/02/19  1:57 PM   Result Value Ref Range   Sodium 141 135 - 145 mmol/L   Potassium 3.8 3.5 - 5.1 mmol/L   Chloride 104 98 - 111 mmol/L   CO2 27 22 - 32 mmol/L   Glucose, Bld 114 (H) 70 - 99 mg/dL   BUN 24 (H) 8 - 23 mg/dL   Creatinine, Ser 0.83 0.44 - 1.00 mg/dL   Calcium 8.9 8.9 - 10.3 mg/dL   Total Protein 6.9 6.5 - 8.1 g/dL   Albumin 4.0 3.5 - 5.0 g/dL   AST 18 15 - 41 U/L   ALT 13 0 - 44 U/L   Alkaline Phosphatase 79 38 - 126 U/L   Total Bilirubin 0.3 0.3 - 1.2 mg/dL   GFR calc non Af Amer >60 >60 mL/min   GFR calc Af Amer >60 >60 mL/min   Anion gap 10 5 - 15    Comment: Performed at [facility], [address] [place], [place] [address]  Hemoglobin A1c     Status: None   Collection Time: 09/11/19  8:28 AM  Result Value Ref Range   Hgb A1c MFr Bld 5.9 4.6 - 6.5 %    Comment: Glycemic Control Guidelines for People with Diabetes:Non Diabetic:  <6%Goal of Therapy: <7%Additional Action Suggested:  >8%   Urinalysis, Routine w reflex microscopic     Status: None   Collection Time: 09/11/19  8:28 AM  Result Value Ref Range   Color, Urine YELLOW YELLOW   APPearance CLEAR CLEAR   Specific Gravity, Urine 1.021 1.001 - 1.03   pH 6.0 5.0 - 8.0   Glucose, UA NEGATIVE NEGATIVE   Bilirubin Urine NEGATIVE NEGATIVE   Ketones, ur NEGATIVE NEGATIVE   Hgb urine dipstick NEGATIVE NEGATIVE   Protein, ur NEGATIVE NEGATIVE   Nitrite NEGATIVE NEGATIVE   Leukocytes,Ua NEGATIVE NEGATIVE  TSH     Status: None   Collection Time: 09/11/19  8:28 AM  Result Value Ref Range   TSH 3.12 0.35 - 4.50 uIU/mL  Lipid panel     Status: Abnormal   Collection Time: 09/11/19  8:28 AM  Result Value Ref Range   Cholesterol 239 (H) 0 - 200 mg/dL    Comment: ATP III Classification       Desirable:  < 200 mg/dL               Borderline High:  200 - 239 mg/dL          High:  > = 240 mg/dL   Triglycerides 122.0 0.0 - 149.0 mg/dL    Comment: Normal:  <150 mg/dLBorderline High:  150 - 199 mg/dL   HDL 72.00 >39.00 mg/dL    VLDL 24.4 0.0 - 40.0 mg/dL   LDL Cholesterol 142 (H) 0 - 99  mg/dL   Total CHOL/HDL Ratio 3     Comment:                Men          Women1/2 Average Risk     3.4          3.3Average Risk          5.0          4.42X Average Risk          9.6          7.13X Average Risk          15.0          11.0                       NonHDL 166.82     Comment: NOTE:  Non-HDL goal should be 30 mg/dL higher than patient's LDL goal (i.e. LDL goal of < 70 mg/dL, would have non-HDL goal of < 100 mg/dL)  CBC w/Diff     Status: Abnormal   Collection Time: 09/11/19  8:28 AM  Result Value Ref Range   WBC 5.2 4.0 - 10.5 K/uL   RBC 4.37 3.87 - 5.11 Mil/uL   Hemoglobin 13.0 12.0 - 15.0 g/dL   HCT 40.4 36.0 - 46.0 %   MCV 92.5 78.0 - 100.0 fl   MCHC 32.2 30.0 - 36.0 g/dL   RDW 13.4 11.5 - 15.5 %   Platelets 233.0 150.0 - 400.0 K/uL   Neutrophils Relative % 68.8 43.0 - 77.0 %   Lymphocytes Relative 12.1 12.0 - 46.0 %   Monocytes Relative 12.8 (H) 3.0 - 12.0 %   Eosinophils Relative 5.4 (H) 0.0 - 5.0 %   Basophils Relative 0.9 0.0 - 3.0 %   Neutro Abs 3.6 1.4 - 7.7 K/uL   Lymphs Abs 0.6 (L) 0.7 - 4.0 K/uL   Monocytes Absolute 0.7 0.1 - 1.0 K/uL   Eosinophils Absolute 0.3 0.0 - 0.7 K/uL   Basophils Absolute 0.0 0.0 - 0.1 K/uL  Comprehensive metabolic panel     Status: Abnormal   Collection Time: 09/11/19  8:28 AM  Result Value Ref Range   Sodium 141 135 - 145 mEq/L   Potassium 4.0 3.5 - 5.1 mEq/L   Chloride 103 96 - 112 mEq/L   CO2 28 19 - 32 mEq/L   Glucose, Bld 103 (H) 70 - 99 mg/dL   BUN 21 6 - 23 mg/dL   Creatinine, Ser 0.72 0.40 - 1.20 mg/dL   Total Bilirubin 0.4 0.2 - 1.2 mg/dL   Alkaline Phosphatase 89 39 - 117 U/L   AST 14 0 - 37 U/L   ALT 10 0 - 35 U/L   Total Protein 7.3 6.0 - 8.3 g/dL   Albumin 4.4 3.5 - 5.2 g/dL   GFR 77.47 >60.00 mL/min   Calcium 9.5 8.4 - 10.5 mg/dL  CBC with Differential/Platelet     Status: Abnormal   Collection Time: 09/14/19  1:20 PM  Result Value Ref Range   WBC 5.8  4.0 - 10.5 K/uL   RBC 4.12 3.87 - 5.11 MIL/uL   Hemoglobin 12.1 12.0 - 15.0 g/dL   HCT 37.4 36.0 - 46.0 %   MCV 90.8 80.0 - 100.0 fL   MCH 29.4 26.0 - 34.0 pg   MCHC 32.4 30.0 - 36.0 g/dL   RDW 13.0 11.5 - 15.5 %   Platelets 218 150 - 400 K/uL  nRBC 0.0 0.0 - 0.2 %   Neutrophils Relative % 80 %   Neutro Abs 4.7 1.7 - 7.7 K/uL   Lymphocytes Relative 8 %   Lymphs Abs 0.5 (L) 0.7 - 4.0 K/uL   Monocytes Relative 8 %   Monocytes Absolute 0.5 0.1 - 1.0 K/uL   Eosinophils Relative 2 %   Eosinophils Absolute 0.1 0.0 - 0.5 K/uL   Basophils Relative 1 %   Basophils Absolute 0.0 0.0 - 0.1 K/uL   Immature Granulocytes 1 %   Abs Immature Granulocytes 0.03 0.00 - 0.07 K/uL    Comment: Performed at [facility], [address] [place], [place] [address]  Comprehensive metabolic panel     Status: Abnormal   Collection Time: 09/14/19  1:20 PM  Result Value Ref Range   Sodium 135 135 - 145 mmol/L   Potassium 3.9 3.5 - 5.1 mmol/L   Chloride 102 98 - 111 mmol/L   CO2 23 22 - 32 mmol/L   Glucose, Bld 202 (H) 70 - 99 mg/dL   BUN 19 8 - 23 mg/dL   Creatinine, Ser 0.82 0.44 - 1.00 mg/dL   Calcium 8.3 (L) 8.9 - 10.3 mg/dL   Total Protein 7.0 6.5 - 8.1 g/dL   Albumin 3.9 3.5 - 5.0 g/dL   AST 24 15 - 41 U/L   ALT 14 0 - 44 U/L   Alkaline Phosphatase 82 38 - 126 U/L   Total Bilirubin 0.6 0.3 - 1.2 mg/dL   GFR calc non Af Amer >60 >60 mL/min   GFR calc Af Amer >60 >60 mL/min   Anion gap 10 5 - 15    Comment: Performed at [facility], [address] [place], [place] [address]   Objective  Body mass index is 24.28 kg/m. Wt Readings from Last 3 Encounters:  09/15/19 150 lb 6.4 oz (68.2 kg)  09/14/19 149 lb 14.6 oz (68 kg)  07/29/19 148 lb 11.2 oz (67.4 kg)   Temp Readings from Last 3 Encounters:  09/15/19 97.6 F (36.4 C) (Skin)  09/14/19 98 F (36.7 C) (Tympanic)  07/29/19 98.2 F (36.8 C) (Tympanic)   BP Readings from Last 3 Encounters:  09/15/19 126/80   09/14/19 (!) 142/75  07/29/19 135/78   Pulse Readings from Last 3 Encounters:  09/15/19 (!) 101  09/14/19 (!) 102  07/29/19 71    Physical Exam Vitals signs and nursing note reviewed.  Constitutional:      Appearance: Normal appearance. She is well-developed and well-groomed.     Comments: +mask on    HENT:     Head: Normocephalic and atraumatic.  Eyes:     Conjunctiva/sclera: Conjunctivae normal.     Pupils: Pupils are equal, round, and reactive to light.  Cardiovascular:     Rate and Rhythm: Normal rate and regular rhythm.     Heart sounds: Normal heart sounds. No murmur.  Pulmonary:     Effort: Pulmonary effort is normal.     Breath sounds: Normal breath sounds.  Abdominal:     General: Abdomen is flat. Bowel sounds are normal.     Tenderness: There is no abdominal tenderness.    Skin:    General: Skin is warm and dry.  Neurological:     General: No focal deficit present.     Mental Status: She is alert and oriented to person, place, and time. Mental status is at baseline.     Gait: Gait normal.  Psychiatric:  Attention and Perception: Attention and perception normal.        Mood and Affect: Mood and affect normal.        Speech: Speech normal.        Behavior: Behavior normal. Behavior is cooperative.        Thought Content: Thought content normal.        Cognition and Memory: Cognition and memory normal.        Judgment: Judgment normal.     Assessment  Plan  Essential hypertension Controlled  Cont meds   Generalized anxiety disorder Disc supplement serotonin in [place] sleep stress relax brand She is currently taking Anxie-T supplement otc which helps  Could not tolerate lexapro or cymbalta was also on xanax and buspar and zoloft in the past   Hyperlipidemia LDL goal <100 Declines statin   Prediabetes   Given healthy foods list   Reduced night vision  rec she call and sch eye MD appt last was spring 2019  HM utd flu 2020 at [facility], prevnar, pna 23, Tdap zoster  Disc shingrix in future given Rx today 09/15/19 to get at pharmacy mammo+ 05/2019 breast cancer f/u H/o Dr. Mike Gip  Pap out of age window  dexa 03/2017 +osteoporosis; [place] [facility] 05/12/19 + declines prolia on strontium 680 mg x 2 qhs  Colonoscopy 02/2006 polyps diverticulosis -declines fobt 09/14/2019  Seen 06/09/19 Dr. 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 Barry Dienes CCS   H/o Dr. Cleon Dew current issues 09/15/2019   Provider: Dr. Olivia Mackie McLean-Scocuzza-Internal Medicine

## 2019-09-15 NOTE — Patient Instructions (Addendum)
Calcium carbonate 600 mg 1 x per day   Strontium for bone health  Stress Relax Tranquil Sleep (has serotonin) -amazon or whole foods   Utter cream to chest  Call the eye doctor for appointment     Healthy Eating Following a healthy eating pattern may help you to achieve and maintain a healthy body weight, reduce the risk of chronic disease, and live a long and productive life. It is important to follow a healthy eating pattern at an appropriate calorie level for your body. Your nutritional needs should be met primarily through food by choosing a variety of nutrient-rich foods. What are tips for following this plan? Reading food labels  Read labels and choose the following: ? Reduced or low sodium. ? Juices with 100% fruit juice. ? Foods with low saturated fats and high polyunsaturated and monounsaturated fats. ? Foods with whole grains, such as whole wheat, cracked wheat, brown rice, and wild rice. ? Whole grains that are fortified with folic acid. This is recommended for women who are pregnant or who want to become pregnant.  Read labels and avoid the following: ? Foods with a lot of added sugars. These include foods that contain brown sugar, corn sweetener, corn syrup, dextrose, fructose, glucose, high-fructose corn syrup, honey, invert sugar, lactose, malt syrup, maltose, molasses, raw sugar, sucrose, trehalose, or turbinado sugar.  Do not eat more than the following amounts of added sugar per day:  6 teaspoons (25 g) for women.  9 teaspoons (38 g) for men. ? Foods that contain processed or refined starches and grains. ? Refined grain products, such as white flour, degermed cornmeal, white bread, and white rice. Shopping  Choose nutrient-rich snacks, such as vegetables, whole fruits, and nuts. Avoid high-calorie and high-sugar snacks, such as potato chips, fruit snacks, and candy.  Use oil-based dressings and spreads on foods instead of solid fats such as butter, stick  margarine, or cream cheese.  Limit pre-made sauces, mixes, and "instant" products such as flavored rice, instant noodles, and ready-made pasta.  Try more plant-protein sources, such as tofu, tempeh, black beans, edamame, lentils, nuts, and seeds.  Explore eating plans such as the Mediterranean diet or vegetarian diet. Cooking  Use oil to saut or stir-fry foods instead of solid fats such as butter, stick margarine, or lard.  Try baking, boiling, grilling, or broiling instead of frying.  Remove the fatty part of meats before cooking.  Steam vegetables in water or broth. Meal planning   At meals, imagine dividing your plate into fourths: ? One-half of your plate is fruits and vegetables. ? One-fourth of your plate is whole grains. ? One-fourth of your plate is protein, especially lean meats, poultry, eggs, tofu, beans, or nuts.  Include low-fat dairy as part of your daily diet. Lifestyle  Choose healthy options in all settings, including home, work, school, restaurants, or stores.  Prepare your food safely: ? Wash your hands after handling raw meats. ? Keep food preparation surfaces clean by regularly washing with hot, soapy water. ? Keep raw meats separate from ready-to-eat foods, such as fruits and vegetables. ? Cook seafood, meat, poultry, and eggs to the recommended internal temperature. ? Store foods at safe temperatures. In general:  Keep cold foods at 49F (4.4C) or below.  Keep hot foods at 149F (60C) or above.  Keep your freezer at [facility] (-17.8C) or below.  Foods are no longer safe to eat when they have been between the temperatures of 40-149F (4.4-60C) for more than 2  hours. What foods should I eat? Fruits Aim to eat 2 cup-equivalents of fresh, canned (in natural juice), or frozen fruits each day. Examples of 1 cup-equivalent of fruit include 1 small apple, 8 large strawberries, 1 cup canned fruit,  cup dried fruit, or 1 cup 100% juice. Vegetables Aim to  eat 2-3 cup-equivalents of fresh and frozen vegetables each day, including different varieties and colors. Examples of 1 cup-equivalent of vegetables include 2 medium carrots, 2 cups raw, leafy greens, 1 cup chopped vegetable (raw or cooked), or 1 medium baked potato. Grains Aim to eat 6 ounce-equivalents of whole grains each day. Examples of 1 ounce-equivalent of grains include 1 slice of bread, 1 cup ready-to-eat cereal, 3 cups popcorn, or  cup cooked rice, pasta, or cereal. Meats and other proteins Aim to eat 5-6 ounce-equivalents of protein each day. Examples of 1 ounce-equivalent of protein include 1 egg, 1/2 cup nuts or seeds, or 1 tablespoon (16 g) peanut butter. A cut of meat or fish that is the size of a deck of cards is about 3-4 ounce-equivalents.  Of the protein you eat each week, try to have at least 8 ounces come from seafood. This includes salmon, trout, herring, and anchovies. Dairy Aim to eat 3 cup-equivalents of fat-free or low-fat dairy each day. Examples of 1 cup-equivalent of dairy include 1 cup (240 mL) milk, 8 ounces (250 g) yogurt, 1 ounces (44 g) natural cheese, or 1 cup (240 mL) fortified soy milk. Fats and oils  Aim for about 5 teaspoons (21 g) per day. Choose monounsaturated fats, such as canola and olive oils, avocados, peanut butter, and most nuts, or polyunsaturated fats, such as sunflower, corn, and soybean oils, walnuts, pine nuts, sesame seeds, sunflower seeds, and flaxseed. Beverages  Aim for six 8-oz glasses of water per day. Limit coffee to three to five 8-oz cups per day.  Limit caffeinated beverages that have added calories, such as soda and energy drinks.  Limit alcohol intake to no more than 1 drink a day for nonpregnant women and 2 drinks a day for men. One drink equals 12 oz of beer (355 mL), 5 oz of wine (148 mL), or 1 oz of hard liquor (44 mL). Seasoning and other foods  Avoid adding excess amounts of salt to your foods. Try flavoring foods with  herbs and spices instead of salt.  Avoid adding sugar to foods.  Try using oil-based dressings, sauces, and spreads instead of solid fats. This information is based on general [place] nutrition guidelines. For more information, visit Build[employer].es. Exact amounts may vary based on your nutrition needs. Summary  A healthy eating plan may help you to maintain a healthy weight, reduce the risk of chronic diseases, and stay active throughout your life.  Plan your meals. Make sure you eat the right portions of a variety of nutrient-rich foods.  Try baking, boiling, grilling, or broiling instead of frying.  Choose healthy options in all settings, including home, work, school, restaurants, or stores. This information is not intended to replace advice given to you by your health care provider. Make sure you discuss any questions you have with your health care provider. Document Released: 02/03/2018 Document Revised: 02/03/2018 Document Reviewed: 02/03/2018 Elsevier Patient Education  [place].  Diabetes Mellitus and Nutrition, Adult When you have diabetes (diabetes mellitus), it is very important to have healthy eating habits because your blood sugar (glucose) levels are greatly affected by what you eat and drink. Eating healthy foods in the  appropriate amounts, at about the same times every day, can help you:  Control your blood glucose.  Lower your risk of heart disease.  Improve your blood pressure.  Reach or maintain a healthy weight. Every person with diabetes is different, and each person has different needs for a meal plan. Your health care provider may recommend that you work with a diet and nutrition specialist (dietitian) to make a meal plan that is best for you. Your meal plan may vary depending on factors such as:  The calories you need.  The medicines you take.  Your weight.  Your blood glucose, blood pressure, and cholesterol levels.  Your activity  level.  Other health conditions you have, such as heart or kidney disease. How do carbohydrates affect me? Carbohydrates, also called carbs, affect your blood glucose level more than any other type of food. Eating carbs naturally raises the amount of glucose in your blood. Carb counting is a method for keeping track of how many carbs you eat. Counting carbs is important to keep your blood glucose at a healthy level, especially if you use insulin or take certain oral diabetes medicines. It is important to know how many carbs you can safely have in each meal. This is different for every person. Your dietitian can help you calculate how many carbs you should have at each meal and for each snack. Foods that contain carbs include:  Bread, cereal, rice, pasta, and crackers.  Potatoes and corn.  Peas, beans, and lentils.  Milk and yogurt.  Fruit and juice.  Desserts, such as cakes, cookies, ice cream, and candy. How does alcohol affect me? Alcohol can cause a sudden decrease in blood glucose (hypoglycemia), especially if you use insulin or take certain oral diabetes medicines. Hypoglycemia can be a life-threatening condition. Symptoms of hypoglycemia (sleepiness, dizziness, and confusion) are similar to symptoms of having too much alcohol. If your health care provider says that alcohol is safe for you, follow these guidelines:  Limit alcohol intake to no more than 1 drink per day for nonpregnant women and 2 drinks per day for men. One drink equals 12 oz of beer, 5 oz of wine, or 1 oz of hard liquor.  Do not drink on an empty stomach.  Keep yourself hydrated with water, diet soda, or unsweetened iced tea.  Keep in mind that regular soda, juice, and other mixers may contain a lot of sugar and must be counted as carbs. What are tips for following this plan?  Reading food labels  Start by checking the serving size on the "Nutrition Facts" label of packaged foods and drinks. The amount of  calories, carbs, fats, and other nutrients listed on the label is based on one serving of the item. Many items contain more than one serving per package.  Check the total grams (g) of carbs in one serving. You can calculate the number of servings of carbs in one serving by dividing the total carbs by 15. For example, if a food has 30 g of total carbs, it would be equal to 2 servings of carbs.  Check the number of grams (g) of saturated and trans fats in one serving. Choose foods that have low or no amount of these fats.  Check the number of milligrams (mg) of salt (sodium) in one serving. Most people should limit total sodium intake to less than 2,300 mg per day.  Always check the nutrition information of foods labeled as "low-fat" or "nonfat". These foods may be  higher in added sugar or refined carbs and should be avoided.  Talk to your dietitian to identify your daily goals for nutrients listed on the label. Shopping  Avoid buying canned, premade, or processed foods. These foods tend to be high in fat, sodium, and added sugar.  Shop around the outside edge of the grocery store. This includes fresh fruits and vegetables, bulk grains, fresh meats, and fresh dairy. Cooking  Use low-heat cooking methods, such as baking, instead of high-heat cooking methods like deep frying.  Cook using healthy oils, such as olive, canola, or sunflower oil.  Avoid cooking with butter, cream, or high-fat meats. Meal planning  Eat meals and snacks regularly, preferably at the same times every day. Avoid going long periods of time without eating.  Eat foods high in fiber, such as fresh fruits, vegetables, beans, and whole grains. Talk to your dietitian about how many servings of carbs you can eat at each meal.  Eat 4-6 ounces (oz) of lean protein each day, such as lean meat, chicken, fish, eggs, or tofu. One oz of lean protein is equal to: ? 1 oz of meat, chicken, or fish. ? 1 egg. ?  cup of tofu.  Eat  some foods each day that contain healthy fats, such as avocado, nuts, seeds, and fish. Lifestyle  Check your blood glucose regularly.  Exercise regularly as told by your health care provider. This may include: ? 150 minutes of moderate-intensity or vigorous-intensity exercise each week. This could be brisk walking, biking, or water aerobics. ? Stretching and doing strength exercises, such as yoga or weightlifting, at least 2 times a week.  Take medicines as told by your health care provider.  Do not use any products that contain nicotine or tobacco, such as cigarettes and e-cigarettes. If you need help quitting, ask your health care provider.  Work with a Social worker or diabetes educator to identify strategies to manage stress and any emotional and social challenges. Questions to ask a health care provider  Do I need to meet with a diabetes educator?  Do I need to meet with a dietitian?  What number can I call if I have questions?  When are the best times to check my blood glucose? Where to find more information:  American Diabetes Association: diabetes.org  Academy of Nutrition and Dietetics: www.eatright.[employer] of Diabetes and Digestive and Kidney Diseases (NIH): [employer] Summary  A healthy meal plan will help you control your blood glucose and maintain a healthy lifestyle.  Working with a diet and nutrition specialist (dietitian) can help you make a meal plan that is best for you.  Keep in mind that carbohydrates (carbs) and alcohol have immediate effects on your blood glucose levels. It is important to count carbs and to use alcohol carefully. This information is not intended to replace advice given to you by your health care provider. Make sure you discuss any questions you have with your health care provider. Document Released: 07/19/2005 Document Revised: 10/04/2017 Document Reviewed: 11/26/2016 Elsevier Patient Education  2020 [employer].

## 2019-09-23 ENCOUNTER — Encounter: Payer: Self-pay | Admitting: Internal Medicine

## 2019-09-25 ENCOUNTER — Other Ambulatory Visit: Payer: Self-pay

## 2019-09-25 DIAGNOSIS — Z20822 Contact with and (suspected) exposure to covid-19: Secondary | ICD-10-CM

## 2019-09-26 ENCOUNTER — Other Ambulatory Visit: Payer: Self-pay

## 2019-09-26 ENCOUNTER — Emergency Department: Payer: Medicare Other

## 2019-09-26 ENCOUNTER — Inpatient Hospital Stay
Admission: EM | Admit: 2019-09-26 | Discharge: 2019-09-30 | DRG: 193 | Disposition: A | Discharge status: Home / Self Care | Payer: Medicare Other | Attending: Internal Medicine | Admitting: Internal Medicine

## 2019-09-26 DIAGNOSIS — J129 Viral pneumonia, unspecified: Secondary | ICD-10-CM | POA: Diagnosis not present

## 2019-09-26 DIAGNOSIS — J9601 Acute respiratory failure with hypoxia: Secondary | ICD-10-CM | POA: Diagnosis present

## 2019-09-26 DIAGNOSIS — Z79899 Other long term (current) drug therapy: Secondary | ICD-10-CM

## 2019-09-26 DIAGNOSIS — J189 Pneumonia, unspecified organism: Secondary | ICD-10-CM | POA: Diagnosis not present

## 2019-09-26 DIAGNOSIS — Z9049 Acquired absence of other specified parts of digestive tract: Secondary | ICD-10-CM

## 2019-09-26 DIAGNOSIS — Z79810 Long term (current) use of selective estrogen receptor modulators (SERMs): Secondary | ICD-10-CM

## 2019-09-26 DIAGNOSIS — D649 Anemia, unspecified: Secondary | ICD-10-CM | POA: Diagnosis present

## 2019-09-26 DIAGNOSIS — E785 Hyperlipidemia, unspecified: Secondary | ICD-10-CM | POA: Diagnosis not present

## 2019-09-26 DIAGNOSIS — R410 Disorientation, unspecified: Secondary | ICD-10-CM | POA: Diagnosis not present

## 2019-09-26 DIAGNOSIS — R296 Repeated falls: Secondary | ICD-10-CM | POA: Diagnosis present

## 2019-09-26 DIAGNOSIS — G9341 Metabolic encephalopathy: Secondary | ICD-10-CM | POA: Diagnosis not present

## 2019-09-26 DIAGNOSIS — R531 Weakness: Secondary | ICD-10-CM

## 2019-09-26 DIAGNOSIS — Z20828 Contact with and (suspected) exposure to other viral communicable diseases: Secondary | ICD-10-CM | POA: Diagnosis not present

## 2019-09-26 DIAGNOSIS — Z888 Allergy status to other drugs, medicaments and biological substances status: Secondary | ICD-10-CM

## 2019-09-26 DIAGNOSIS — R0902 Hypoxemia: Secondary | ICD-10-CM | POA: Diagnosis not present

## 2019-09-26 DIAGNOSIS — R509 Fever, unspecified: Secondary | ICD-10-CM | POA: Diagnosis not present

## 2019-09-26 DIAGNOSIS — I10 Essential (primary) hypertension: Secondary | ICD-10-CM | POA: Diagnosis present

## 2019-09-26 DIAGNOSIS — J302 Other seasonal allergic rhinitis: Secondary | ICD-10-CM | POA: Diagnosis present

## 2019-09-26 DIAGNOSIS — Z90710 Acquired absence of both cervix and uterus: Secondary | ICD-10-CM

## 2019-09-26 DIAGNOSIS — K2970 Gastritis, unspecified, without bleeding: Secondary | ICD-10-CM | POA: Diagnosis present

## 2019-09-26 DIAGNOSIS — A419 Sepsis, unspecified organism: Secondary | ICD-10-CM

## 2019-09-26 DIAGNOSIS — Z86000 Personal history of in-situ neoplasm of breast: Secondary | ICD-10-CM

## 2019-09-26 DIAGNOSIS — Z20822 Contact with and (suspected) exposure to covid-19: Secondary | ICD-10-CM | POA: Diagnosis present

## 2019-09-26 DIAGNOSIS — Z8249 Family history of ischemic heart disease and other diseases of the circulatory system: Secondary | ICD-10-CM

## 2019-09-26 DIAGNOSIS — R000 Tachycardia, unspecified: Secondary | ICD-10-CM | POA: Diagnosis present

## 2019-09-26 DIAGNOSIS — F411 Generalized anxiety disorder: Secondary | ICD-10-CM | POA: Diagnosis not present

## 2019-09-26 DIAGNOSIS — F329 Major depressive disorder, single episode, unspecified: Secondary | ICD-10-CM | POA: Diagnosis present

## 2019-09-26 DIAGNOSIS — Z923 Personal history of irradiation: Secondary | ICD-10-CM

## 2019-09-26 DIAGNOSIS — Z853 Personal history of malignant neoplasm of breast: Secondary | ICD-10-CM

## 2019-09-26 LAB — URINALYSIS, COMPLETE (UACMP) WITH MICROSCOPIC
Bilirubin Urine: NEGATIVE
Glucose, UA: NEGATIVE mg/dL
Hgb urine dipstick: NEGATIVE
Ketones, ur: 5 mg/dL — AB
Leukocytes,Ua: NEGATIVE
Nitrite: NEGATIVE
Protein, ur: NEGATIVE mg/dL
Specific Gravity, Urine: 1.01 (ref 1.005–1.030)
pH: 5 (ref 5.0–8.0)

## 2019-09-26 LAB — COMPREHENSIVE METABOLIC PANEL
ALT: 15 U/L (ref 0–44)
AST: 20 U/L (ref 15–41)
Albumin: 3.4 g/dL — ABNORMAL LOW (ref 3.5–5.0)
Alkaline Phosphatase: 99 U/L (ref 38–126)
Anion gap: 12 (ref 5–15)
BUN: 26 mg/dL — ABNORMAL HIGH (ref 8–23)
CO2: 21 mmol/L — ABNORMAL LOW (ref 22–32)
Calcium: 8.2 mg/dL — ABNORMAL LOW (ref 8.9–10.3)
Chloride: 101 mmol/L (ref 98–111)
Creatinine, Ser: 0.92 mg/dL (ref 0.44–1.00)
GFR calc Af Amer: >60 mL/min (ref >60)
GFR calc non Af Amer: 58 mL/min — ABNORMAL LOW (ref >60)
Glucose, Bld: 129 mg/dL — ABNORMAL HIGH (ref 70–99)
Potassium: 3.9 mmol/L (ref 3.5–5.1)
Sodium: 134 mmol/L — ABNORMAL LOW (ref 135–145)
Total Bilirubin: 1.2 mg/dL (ref 0.3–1.2)
Total Protein: 7 g/dL (ref 6.5–8.1)

## 2019-09-26 LAB — CBC WITH DIFFERENTIAL/PLATELET
Abs Immature Granulocytes: 0.06 10*3/uL (ref 0.00–0.07)
Basophils Absolute: 0 10*3/uL (ref 0.0–0.1)
Basophils Relative: 0 %
Eosinophils Absolute: 0 10*3/uL (ref 0.0–0.5)
Eosinophils Relative: 0 %
HCT: 31.2 % — ABNORMAL LOW (ref 36.0–46.0)
Hemoglobin: 10.4 g/dL — ABNORMAL LOW (ref 12.0–15.0)
Immature Granulocytes: 1 %
Lymphocytes Relative: 3 %
Lymphs Abs: 0.3 10*3/uL — ABNORMAL LOW (ref 0.7–4.0)
MCH: 28.8 pg (ref 26.0–34.0)
MCHC: 33.3 g/dL (ref 30.0–36.0)
MCV: 86.4 fL (ref 80.0–100.0)
Monocytes Absolute: 0.8 10*3/uL (ref 0.1–1.0)
Monocytes Relative: 8 %
Neutro Abs: 8.1 10*3/uL — ABNORMAL HIGH (ref 1.7–7.7)
Neutrophils Relative %: 88 %
Platelets: 235 10*3/uL (ref 150–400)
RBC: 3.61 MIL/uL — ABNORMAL LOW (ref 3.87–5.11)
RDW: 13 % (ref 11.5–15.5)
WBC: 9.3 10*3/uL (ref 4.0–10.5)
nRBC: 0 % (ref 0.0–0.2)

## 2019-09-26 LAB — FIBRIN DERIVATIVES D-DIMER (ARMC ONLY): Fibrin derivatives D-dimer (ARMC): 4613.22 ng/mL (FEU) — ABNORMAL HIGH (ref 0.00–499.00)

## 2019-09-26 LAB — C-REACTIVE PROTEIN: CRP: 14.3 mg/dL — ABNORMAL HIGH (ref <1.0)

## 2019-09-26 LAB — POC SARS CORONAVIRUS 2 AG: SARS Coronavirus 2 Ag: NEGATIVE

## 2019-09-26 LAB — ABO/RH: ABO/RH(D): O POS

## 2019-09-26 LAB — LACTATE DEHYDROGENASE: LDH: 167 U/L (ref 98–192)

## 2019-09-26 LAB — LACTIC ACID, PLASMA: Lactic Acid, Venous: 1.1 mmol/L (ref 0.5–1.9)

## 2019-09-26 LAB — PROCALCITONIN: Procalcitonin: <0.1 ng/mL

## 2019-09-26 MED ORDER — SODIUM CHLORIDE 0.9 % IV SOLN
500.0000 mg | Freq: Once | INTRAVENOUS | Status: DC
  Filled 2019-09-26: qty 500

## 2019-09-26 MED ORDER — SODIUM CHLORIDE 0.9 % IV SOLN: 2.0000 g | INTRAVENOUS | Status: DC

## 2019-09-26 MED ORDER — ACETAMINOPHEN 500 MG PO TABS: 1000.0000 mg | ORAL_TABLET | ORAL | Status: DC

## 2019-09-26 MED ORDER — SODIUM CHLORIDE 0.9 % IV SOLN
500.0000 mg | INTRAVENOUS | Status: DC
  Administered 2019-09-26: 500 mg via INTRAVENOUS
  Filled 2019-09-26 (×2): qty 500

## 2019-09-26 MED ORDER — ONDANSETRON HCL 4 MG PO TABS: 4.0000 mg | ORAL_TABLET | Freq: Four times a day (QID) | ORAL | Status: DC | PRN

## 2019-09-26 MED ORDER — ACETAMINOPHEN 650 MG RE SUPP: 650.0000 mg | Freq: Four times a day (QID) | RECTAL | Status: DC | PRN

## 2019-09-26 MED ORDER — ENOXAPARIN SODIUM 40 MG/0.4ML ~~LOC~~ SOLN: 40.0000 mg | SUBCUTANEOUS | Status: DC

## 2019-09-26 MED ORDER — SODIUM CHLORIDE 0.9 % IV SOLN
INTRAVENOUS | Status: DC
  Administered 2019-09-26 – 2019-09-28 (×2): via INTRAVENOUS

## 2019-09-26 MED ORDER — SODIUM CHLORIDE 0.9 % IV SOLN
2.0000 g | Freq: Once | INTRAVENOUS | Status: DC
  Administered 2019-09-26: 2 g via INTRAVENOUS
  Filled 2019-09-26: qty 20

## 2019-09-26 MED ORDER — ENOXAPARIN SODIUM 40 MG/0.4ML ~~LOC~~ SOLN
40.0000 mg | Freq: Two times a day (BID) | SUBCUTANEOUS | Status: DC
  Administered 2019-09-26 – 2019-09-28 (×4): 40 mg via SUBCUTANEOUS
  Filled 2019-09-26 (×4): qty 0.4

## 2019-09-26 MED ORDER — ACETAMINOPHEN 325 MG PO TABS
650.0000 mg | ORAL_TABLET | Freq: Four times a day (QID) | ORAL | Status: DC | PRN
  Administered 2019-09-27 (×2): 650 mg via ORAL
  Filled 2019-09-26 (×2): qty 2

## 2019-09-26 MED ORDER — SODIUM CHLORIDE 0.9 % IV BOLUS
500.0000 mL | Freq: Once | INTRAVENOUS | Status: AC
  Administered 2019-09-26: 500 mL via INTRAVENOUS

## 2019-09-26 MED ORDER — ONDANSETRON HCL 4 MG/2ML IJ SOLN: 4.0000 mg | Freq: Four times a day (QID) | INTRAMUSCULAR | Status: DC | PRN

## 2019-09-26 MED ORDER — ACETAMINOPHEN 500 MG PO TABS
1000.0000 mg | ORAL_TABLET | Freq: Once | ORAL | Status: AC
  Administered 2019-09-26: 1000 mg via ORAL
  Filled 2019-09-26: qty 2

## 2019-09-26 NOTE — ED Notes (Signed)
Antibiotics d/c per admitting MD. Pending admit orders. Pt in no acute distress at this time.

## 2019-09-26 NOTE — H&P (Signed)
History and Physical  Elaine Price [id number] DOB: 18-Mar-1935 DOA: 09/26/2019  Referring physician: Delman Kitten, ER physician PCP: McLean-Scocuzza, Nino Glow, MD  Outpatient Specialists: None Patient coming from: Independent living & is able to ambulate with use of a cane  Chief Complaint: Weakness  HPI: Elaine Price is a 84 y.o. female with medical history significant for diverticulosis and hypertension who has been in her usual state of health and has been staying isolated and very cautious about wearing a mask when she started having symptoms approximately 5 days ago of congestion and fatigue to the point where she is even had multiple falls.  No loss of consciousness.  She started developing a cough approximately 5 days back.  Patient went to get an outpatient Covid test yesterday, results are pending.  Overall, she just does not feel well and at her daughter's urging, came into the emergency room today.  Patient's daughter notes some episodes of confusion  ED Course: In the emergency room, patient looked to be stable.  Normal oxygen saturations.  Lab work was noteworthy for a normal procalcitonin level, but infiltrate seen on x-ray.  A new rapid 15-minute Covid test was negative (although this test is better for sensitivity then specificity).  Given her weakness and confusion and high suspicion for Covid, it was felt best that she come in for further evaluation.  Hospitalist were called.  Review of Systems: Patient seen in the emergency room. Pt complains of fatigue, congestion, fever and weakness  Pt denies any headaches, vision changes, dysphagia, chest pain, palpitations, shortness of breath, wheeze, cough, abdominal pain, hematuria, dysuria, constipation, diarrhea, focal extremity numbness or pain.  Review of systems are otherwise negative   Past Medical History:  Diagnosis Date   Anemia    PMH   Arthritis    Cancer ([place])    left breast cancer and right  abnormal mammogram   Depression    since death of husband in Feb 18, 2014    Diverticulosis    Essential hypertension    Gallstones    Gastritis and duodenitis    dounf by CT Nov 2012   Hyperlipemia    Pneumonia    hx of   Seasonal allergies    Wears glasses    Past Surgical History:  Procedure Laterality Date   ABDOMINAL HYSTERECTOMY     ACROMIO-CLAVICULAR JOINT REPAIR Right 07/04/2015   Procedure: ACROMIO-CLAVICULAR JOINT REPAIR;  Surgeon: Tania Ade, MD;  Location: [facility];  Service: Orthopedics;  Laterality: Right;  Right open reduction internal fixation clavical with allograft, coracoclaviular reconstruction   APPENDECTOMY     BREAST BIOPSY Right 12/28/2018   affirm bx-"x" clip-path pending   BREAST BIOPSY Left 05/28/2019   Affirm Biopsy- Coil clip- path pending   BREAST LUMPECTOMY WITH RADIOACTIVE SEED AND SENTINEL LYMPH NODE BIOPSY Left 07/02/2019   Procedure: LEFT BREAST LUMPECTOMY WITH RADIOACTIVE SEED AND LEFT SENTINEL LYMPH NODE BIOPSY;  Surgeon: [name], MD;  Location: [facility];  Service: General;  Laterality: Left;   CHOLECYSTECTOMY  02/19/11   CHOLECYSTECTOMY, LAPAROSCOPIC  08/06/2011   gallstones   COLONOSCOPY  02/18/2006   2 mm rectal polyp, diverticulosis   ORIF CLAVICULAR FRACTURE Right 07/04/2015   Procedure: OPEN REDUCTION INTERNAL FIXATION (ORIF) CLAVICULAR FRACTURE;  Surgeon: Tania Ade, MD;  Location: [facility];  Service: Orthopedics;  Laterality: Right;   RADIOACTIVE SEED GUIDED EXCISIONAL BREAST BIOPSY Right 07/02/2019   Procedure: RIGHT BREAST  RADIOACTIVE SEED LOCALIZATION EXCISIONAL BIOPSY;  Surgeon:  [name], MD;  Location: [facility];  Service: General;  Laterality: Right;   TONSILECTOMY, ADENOIDECTOMY, BILATERAL MYRINGOTOMY AND TUBES     as a child, does not remember date    Social History:  reports that she has never smoked. She has never used smokeless tobacco. She reports current alcohol use.  She reports that she does not use drugs.  Lives in independent living.  Ambulates at times with a cane, but mostly without assistance   Allergies  Allergen Reactions   Lexapro [Escitalopram Oxalate] Itching    Family History  Problem Relation Age of Onset   Stroke Mother    Heart attack Mother    Prostate cancer Father    Lung cancer Brother    Prostate cancer Brother    Hypertension Brother    Colon cancer Neg Hx       Prior to Admission medications   Medication Sig Start Date End Date Taking? Authorizing Provider  ALOE VERA PO Take 1 Dose by mouth daily as needed (indigestion).    [provider]  NON FORMULARY Algae Cal pulse bone with calcium 370 mg daily    [provider]  NON FORMULARY True vision    [provider]  NON FORMULARY [employer] anxie-T with [place], GABA, Kava kava, L theanine 200 mg 1 pill daily    [provider]  OVER THE COUNTER MEDICATION Take 1 tablet by mouth daily. Ultranol otc bladder supplement    [provider]  OVER THE COUNTER MEDICATION Take 3 tablets by mouth daily. "blood flow - 7" otc circulation supplement    [provider]  [place] Patient states she takes a pill for eyes/vision    [provider]  Strontium Chloride POWD 680 mg by Does not apply route. 680 mg (2 pills) qhs    [provider]  tamoxifen (NOLVADEX) 20 MG tablet Take 1 tablet (20 mg total) by mouth daily. 09/14/19   Lequita Asal, MD  telmisartan (MICARDIS) 40 MG tablet Take 1 tablet (40 mg total) by mouth daily. 06/08/19   McLean-Scocuzza, Nino Glow, MD    Physical Exam: BP (!) 104/54    Pulse 79    Temp 98.9 F (37.2 C) (Oral)    Resp 14    Ht 5\' 6"  (1.676 m)    Wt 63.5 kg    SpO2 90%    BMI 22.60 kg/m   General: Alert and oriented x3 although at times, she may get a little forgetful (this appears to be acute finding) Eyes: Sclera nonicteric, extraocular movements  are intact ENT: Normocephalic and atraumatic, mucous membranes are moist Neck: Supple, no JVD Cardiovascular: Regular rate and rhythm, S1-S2 Respiratory: Clear to auscultation bilaterally Abdomen: Soft, nontender, nondistended, positive bowel sounds Skin: No skin breaks, tears or lesions Musculoskeletal: No clubbing or cyanosis or edema Psychiatric: Appropriate, no evidence of psychoses Neurologic: No focal deficits.  Normal finger-to-nose.  All cranial nerves appear intact.          Labs on Admission:  Basic Metabolic Panel: Recent Labs  Lab 09/26/19 1247  NA 134*  K 3.9  CL 101  CO2 21*  GLUCOSE 129*  BUN 26*  CREATININE 0.92  CALCIUM 8.2*   Liver Function Tests: Recent Labs  Lab 09/26/19 1247  AST 20  ALT 15  ALKPHOS 99  BILITOT 1.2  PROT 7.0  ALBUMIN 3.4*   No results for input(s): LIPASE, AMYLASE in the last 168 hours. No  results for input(s): AMMONIA in the last 168 hours. CBC: Recent Labs  Lab 09/26/19 1247  WBC 9.3  NEUTROABS 8.1*  HGB 10.4*  HCT 31.2*  MCV 86.4  PLT 235   Cardiac Enzymes: No results for input(s): CKTOTAL, CKMB, CKMBINDEX, TROPONINI in the last 168 hours.  BNP (last 3 results) No results for input(s): BNP in the last 8760 hours.  ProBNP (last 3 results) No results for input(s): PROBNP in the last 8760 hours.  CBG: No results for input(s): GLUCAP in the last 168 hours.  Radiological Exams on Admission: Dg Chest Port 1 View  Result Date: 09/26/2019 CLINICAL DATA:  Low-grade fever. EXAM: PORTABLE CHEST 1 VIEW COMPARISON:  10/24/2016 FINDINGS: 1300 hours. The cardiopericardial silhouette is within normal limits for size. Interstitial markings are diffusely coarsened with chronic features. Patchy subtle airspace opacities are seen peripherally in both lungs. No associated pleural effusion period bones are demineralized. IMPRESSION: 1. Patchy subtle airspace opacities in the periphery of both lungs, left greater than right, without  pleural effusion. Imaging features compatible with multifocal pneumonia and atypical/viral etiology should be considered. I discussed these findings by telephone with Dr. Jacqualine Code at approximately 13 20 on 09/26/2019. Electronically Signed   By: Misty Stanley M.D.   On: 09/26/2019 13:23    EKG: Not done  Assessment/Plan Present on Admission:  Essential hypertension: Hold on any home medications.  Blood pressures are little soft.   Generalized anxiety disorder: Monitor closely.   Hyperlipidemia LDL goal <100: Continue statin.   Suspected COVID-19 virus infection: Monitor.  Await 24-hour Covid test.  Keep in isolation.  Continuous pulse ox.  Follow CRP and ferritin levels.  Procalcitonin level normal.  Waiting for D-dimer.  If Covid test is positive, given infiltrate, will start Remdisivir.  Confusion: Neuro exam is completely nonfocal.  I suspect that this may be the " mental fog" attributed to Covid.  Principal Problem:   Suspected COVID-19 virus infection Active Problems:   Essential hypertension   Generalized anxiety disorder   Hyperlipidemia LDL goal <100   Weakness   DVT prophylaxis: Lovenox, may adjust heparin based off of D-dimer  Code Status: Full code, as confirmed by patient after we discussed.  She has been DNR in the past, but prefers to be full code for now  Family Communication: Spoke with daughter by phone  Disposition Plan: Monitor overnight.  Will discharge home if she remains stable on room air oxygen by tomorrow  Consults called: None  Admission status: Given plans to possibly discharge tomorrow, placed under observation    Annita Brod MD [facility] Pager [phone]  If 7PM-7AM, please contact night-coverage [email or web address] Password [email or web address]  09/26/2019, 6:00 PM

## 2019-09-26 NOTE — ED Notes (Signed)
Floor RN to call back to receive report.

## 2019-09-26 NOTE — ED Provider Notes (Signed)
[facility] Emergency Department Provider Note   ____________________________________________   First MD Initiated Contact with Patient 09/26/19 1312     (approximate)  I have reviewed the triage vital signs and the nursing notes.   HISTORY  Chief Complaint Altered Mental Status    HPI Elaine Price is a 84 y.o. female for evaluation of multiple falls and feeling fatigued  Patient reports that over the last 3 days she is not been able to get herself up with her normal strength.  She reports she feels very tired, like her muscles are weak and fatigued.  She also reports she had a Covid test yesterday that is pending.  She had a bit of a cough since Sunday.  She was not aware that she had a fever today but just has not felt well.  She is felt like she just not quite herself.  She was told that her independent living by someone else that she did not quite look well and that she just was not acting herself either the last few days as well  No chest pain.  No nausea vomiting.  Denies shortness of breath.  Has had a dry cough since Sunday.  No known exposure to Covid, but does live in a facility that has had some cases  No weakness in arm or leg.  She is feels like her whole body is tired and fatigued.  Had a flu shot, described a symptom she has had over the last few days as "flulike"   Past Medical History:  Diagnosis Date   Anemia    PMH   Arthritis    Cancer ([place])    left breast cancer and right abnormal mammogram   Depression    since death of husband in 02/21/14    Diverticulosis    Essential hypertension    Gallstones    Gastritis and duodenitis    dounf by CT Nov 2012   Hyperlipemia    Pneumonia    hx of   Seasonal allergies    Wears glasses     Patient Active Problem List   Diagnosis Date Noted   Diminished night vision 09/15/2019   Goals of care, counseling/discussion 07/21/2019   Ductal carcinoma in situ (DCIS) of  right breast 07/21/2019   Atypical ductal hyperplasia of right breast 06/04/2019   Breast cancer, left ([place]) 05/28/2019   Prediabetes 03/03/2019   Depression, recurrent ([place]) 03/25/2018   Cardiac murmur 03/25/2018   Chronic hip pain, right 02/18/2018   Osteoporosis 03/14/2017   Peripheral vascular disease ([place]) 01/25/2017   Hyperlipidemia LDL goal <100 01/25/2017   Generalized anxiety disorder 02/06/2014   Routine general medical examination at a health care facility 01/19/2013   Back pain, thoracic 10/02/2011   Essential hypertension 07/03/2011    Past Surgical History:  Procedure Laterality Date   ABDOMINAL HYSTERECTOMY     ACROMIO-CLAVICULAR JOINT REPAIR Right 07/04/2015   Procedure: ACROMIO-CLAVICULAR JOINT REPAIR;  Surgeon: Tania Ade, MD;  Location: [place];  Service: Orthopedics;  Laterality: Right;  Right open reduction internal fixation clavical with allograft, coracoclaviular reconstruction   APPENDECTOMY     BREAST BIOPSY Right 12/28/2018   affirm bx-"x" clip-path pending   BREAST BIOPSY Left 05/28/2019   Affirm Biopsy- Coil clip- path pending   BREAST LUMPECTOMY WITH RADIOACTIVE SEED AND SENTINEL LYMPH NODE BIOPSY Left 07/02/2019   Procedure: LEFT BREAST LUMPECTOMY WITH RADIOACTIVE SEED AND LEFT SENTINEL LYMPH NODE BIOPSY;  Surgeon: [name], MD;  Location: [place] [facility];  Service: General;  Laterality: Left;   CHOLECYSTECTOMY  2012   CHOLECYSTECTOMY, LAPAROSCOPIC  08/06/2011   gallstones   COLONOSCOPY  02/18/2006   2 mm rectal polyp, diverticulosis   ORIF CLAVICULAR FRACTURE Right 07/04/2015   Procedure: OPEN REDUCTION INTERNAL FIXATION (ORIF) CLAVICULAR FRACTURE;  Surgeon: Tania Ade, MD;  Location: [place];  Service: Orthopedics;  Laterality: Right;   RADIOACTIVE SEED GUIDED EXCISIONAL BREAST BIOPSY Right 07/02/2019   Procedure: RIGHT BREAST  RADIOACTIVE SEED LOCALIZATION EXCISIONAL BIOPSY;  Surgeon:  [name], MD;  Location: [place];  Service: General;  Laterality: Right;   TONSILECTOMY, ADENOIDECTOMY, BILATERAL MYRINGOTOMY AND TUBES     as a child, does not remember date    Prior to Admission medications   Medication Sig Start Date End Date Taking? Authorizing Provider  ALOE VERA PO Take 1 Dose by mouth daily as needed (indigestion).    [provider]  NON FORMULARY Algae Cal pulse bone with calcium 370 mg daily    [provider]  NON FORMULARY True vision    [provider]  NON FORMULARY Life Seasons anxie-T with [place], GABA, Kava kava, L theanine 200 mg 1 pill daily    [provider]  OVER THE COUNTER MEDICATION Take 1 tablet by mouth daily. Ultranol otc bladder supplement    [provider]  OVER THE COUNTER MEDICATION Take 3 tablets by mouth daily. "blood flow - 7" otc circulation supplement    [provider]  [place] Patient states she takes a pill for eyes/vision    [provider]  Strontium Chloride POWD 680 mg by Does not apply route. 680 mg (2 pills) qhs    [provider]  tamoxifen (NOLVADEX) 20 MG tablet Take 1 tablet (20 mg total) by mouth daily. 09/14/19   Lequita Asal, MD  telmisartan (MICARDIS) 40 MG tablet Take 1 tablet (40 mg total) by mouth daily. 06/08/19   McLean-Scocuzza, Nino Glow, MD    Allergies Lexapro [escitalopram oxalate]  Family History  Problem Relation Age of Onset   Stroke Mother    Heart attack Mother    Prostate cancer Father    Lung cancer Brother    Prostate cancer Brother    Hypertension Brother    Colon cancer Neg Hx     Social History Social History   Tobacco Use   Smoking status: Never Smoker   Smokeless tobacco: Never Used  Substance Use Topics   Alcohol use: Yes    Comment: rare   Drug use: No    Review of Systems Constitutional: No fever/chills except noted when here, feeling very fatigued however Eyes:  No visual changes. ENT: No sore throat. Cardiovascular: Denies chest pain. Respiratory: Denies shortness of breath.  Positive dry cough Gastrointestinal: No abdominal pain.   Genitourinary: Negative for dysuria. Musculoskeletal: Negative for back pain.  Is felt so weak in her muscles that she has not been able to get herself up 3 times over the last day. Skin: Negative for rash. Neurological: Negative for headaches, areas of focal weakness or numbness.  She denies any falls that caused injury.  No passing out.  ____________________________________________   PHYSICAL EXAM:  VITAL SIGNS: ED Triage Vitals  Enc Vitals Group     BP 09/26/19 1241 (!) 114/59     Pulse Rate 09/26/19 1241 95     Resp 09/26/19 1241 14     Temp 09/26/19 1241 (!) 101.6 F (  38.7 C)     Temp Source 09/26/19 1241 Oral     SpO2 09/26/19 1241 98 %     Weight 09/26/19 1243 140 lb (63.5 kg)     Height 09/26/19 1243 5\' 6"  (1.676 m)     Head Circumference --      Peak Flow --      Pain Score 09/26/19 1243 0     Pain Loc --      Pain Edu? --      Excl. in [place]? --     Constitutional: Alert and oriented.  Mildly ill-appearing, pleasant and in no distress. Eyes: Conjunctivae are normal. Head: Atraumatic. Nose: No congestion/rhinnorhea. Mouth/Throat: Mucous membranes are slightly dry. Neck: No stridor.  Cardiovascular: Normal rate, regular rhythm. Grossly normal heart sounds.  Good peripheral circulation. Respiratory: Normal respiratory effort.  No retractions. Lungs CTAB. Gastrointestinal: Soft and nontender. No distention. Musculoskeletal: No lower extremity tenderness nor edema.  Generally fatigued all extremities with 5 out of 5 strength.  No focal deficits.  Moves all extremities well. Neurologic:  Normal speech and language. No gross focal neurologic deficits are appreciated.  Skin:  Skin is warm, dry and intact. No rash noted. Psychiatric: Mood and affect are normal. Speech and behavior are  normal.  ____________________________________________   LABS (all labs ordered are listed, but only abnormal results are displayed)  Labs Reviewed  COMPREHENSIVE METABOLIC PANEL - Abnormal; Notable for the following components:      Result Value   Sodium 134 (*)    CO2 21 (*)    Glucose, Bld 129 (*)    BUN 26 (*)    Calcium 8.2 (*)    Albumin 3.4 (*)    GFR calc non Af Amer 58 (*)    All other components within normal limits  CBC WITH DIFFERENTIAL/PLATELET - Abnormal; Notable for the following components:   RBC 3.61 (*)    Hemoglobin 10.4 (*)    HCT 31.2 (*)    Neutro Abs 8.1 (*)    Lymphs Abs 0.3 (*)    All other components within normal limits  CULTURE, BLOOD (ROUTINE X 2)  CULTURE, BLOOD (ROUTINE X 2)  SARS CORONAVIRUS 2 (TAT 6-24 HRS)  LACTIC ACID, PLASMA  PROCALCITONIN  URINALYSIS, COMPLETE (UACMP) WITH MICROSCOPIC  POC SARS CORONAVIRUS 2 AG -  ED  POC SARS CORONAVIRUS 2 AG   ____________________________________________  EKG   ____________________________________________  RADIOLOGY  Dg Chest Port 1 View  Result Date: 09/26/2019 CLINICAL DATA:  Low-grade fever. EXAM: PORTABLE CHEST 1 VIEW COMPARISON:  10/24/2016 FINDINGS: 1300 hours. The cardiopericardial silhouette is within normal limits for size. Interstitial markings are diffusely coarsened with chronic features. Patchy subtle airspace opacities are seen peripherally in both lungs. No associated pleural effusion period bones are demineralized. IMPRESSION: 1. Patchy subtle airspace opacities in the periphery of both lungs, left greater than right, without pleural effusion. Imaging features compatible with multifocal pneumonia and atypical/viral etiology should be considered. I discussed these findings by telephone with Dr. Jacqualine Code at approximately 13 20 on 09/26/2019. Electronically Signed   By: Misty Stanley M.D.   On: 09/26/2019 13:23     Chest x-ray reviewed by me, multiple airspace opacities.  Discussed with  radiologist, radiologist concerned this could represent COVID-19 pathology ____________________________________________   PROCEDURES  Procedure(s) performed: None  Procedures  Critical Care performed: No  ____________________________________________   INITIAL IMPRESSION / ASSESSMENT AND PLAN / ED COURSE  Pertinent labs & imaging results that were available during  my care of the patient were reviewed by me and considered in my medical decision making (see chart for details).   Symptoms concerning for possible COVID-19.  Cough since Sunday, not febrile, multiple falls over the last day feeling fatigued and what she described as "flulike" symptoms.  Chest x-ray concerning for multifocal infiltrate that seems present peripherally, this is felt likely due to COVID-19 although test results are pending  We will send blood cultures, obtain procalcitonin.  Febrile, normal white count.  Given the patient has had multiple falls, fatigue feeling very fatigued, generally feeling quite ill I would anticipate likely need for admission especially given concern for multifocal infiltrates on imaging studies today in combination with her fatigue, elderly status.  She is not presently hypoxic, denies shortness of breath though.  Will hydrate, plan to reassess and await further work-up.    Naysa Donis was evaluated in Emergency Department on 09/26/2019 for the symptoms described in the history of present illness. She was evaluated in the context of the global COVID-19 pandemic, which necessitated consideration that the patient might be at risk for infection with the SARS-CoV-2 virus that causes COVID-19. Institutional protocols and algorithms that pertain to the evaluation of patients at risk for COVID-19 are in a state of rapid change based on information released by regulatory bodies including the CDC and federal and state organizations. These policies and algorithms were followed during the  patient's care in the ED.  ----------------------------------------- 2:41 PM on 09/26/2019 -----------------------------------------  Discussed with patient, also discussed with hospitalist.  Dr. [place] Pink. I will admit the patient as she has chest x-ray concerning for multifocal pneumonia, with her antigen test negative for COVID-19 raising suspicion for bacterial infection however I certainly would consider her still potentially suspect for COVID-19 infection.  Start empiric antibiotics, admit for further care and work-up.   ____________________________________________   FINAL CLINICAL IMPRESSION(S) / ED DIAGNOSES  Final diagnoses:  Community acquired pneumonia, unspecified laterality  Suspected COVID-19 virus infection        Note:  This document was prepared using Systems analyst and may include unintentional dictation errors       Delman Kitten, MD 09/26/19 1441

## 2019-09-26 NOTE — ED Triage Notes (Signed)
Pt presents via EMS from [facility] independent living c/o episodes of altered mental status over the past week. Denies pain. A&O x4 at this time. Reports pending COVID-19 testing results from yesterday.

## 2019-09-27 ENCOUNTER — Observation Stay: Payer: Medicare Other

## 2019-09-27 DIAGNOSIS — Z86000 Personal history of in-situ neoplasm of breast: Secondary | ICD-10-CM | POA: Diagnosis not present

## 2019-09-27 DIAGNOSIS — J129 Viral pneumonia, unspecified: Secondary | ICD-10-CM | POA: Diagnosis present

## 2019-09-27 DIAGNOSIS — J302 Other seasonal allergic rhinitis: Secondary | ICD-10-CM | POA: Diagnosis present

## 2019-09-27 DIAGNOSIS — J700 Acute pulmonary manifestations due to radiation: Secondary | ICD-10-CM | POA: Diagnosis not present

## 2019-09-27 DIAGNOSIS — I10 Essential (primary) hypertension: Secondary | ICD-10-CM | POA: Diagnosis present

## 2019-09-27 DIAGNOSIS — Z90710 Acquired absence of both cervix and uterus: Secondary | ICD-10-CM | POA: Diagnosis not present

## 2019-09-27 DIAGNOSIS — Z20828 Contact with and (suspected) exposure to other viral communicable diseases: Secondary | ICD-10-CM | POA: Diagnosis not present

## 2019-09-27 DIAGNOSIS — Z8249 Family history of ischemic heart disease and other diseases of the circulatory system: Secondary | ICD-10-CM | POA: Diagnosis not present

## 2019-09-27 DIAGNOSIS — Z923 Personal history of irradiation: Secondary | ICD-10-CM | POA: Diagnosis not present

## 2019-09-27 DIAGNOSIS — F411 Generalized anxiety disorder: Secondary | ICD-10-CM | POA: Diagnosis present

## 2019-09-27 DIAGNOSIS — Z9049 Acquired absence of other specified parts of digestive tract: Secondary | ICD-10-CM | POA: Diagnosis not present

## 2019-09-27 DIAGNOSIS — Z888 Allergy status to other drugs, medicaments and biological substances status: Secondary | ICD-10-CM | POA: Diagnosis not present

## 2019-09-27 DIAGNOSIS — D649 Anemia, unspecified: Secondary | ICD-10-CM | POA: Diagnosis present

## 2019-09-27 DIAGNOSIS — Z79810 Long term (current) use of selective estrogen receptor modulators (SERMs): Secondary | ICD-10-CM | POA: Diagnosis not present

## 2019-09-27 DIAGNOSIS — K2970 Gastritis, unspecified, without bleeding: Secondary | ICD-10-CM | POA: Diagnosis present

## 2019-09-27 DIAGNOSIS — J9601 Acute respiratory failure with hypoxia: Secondary | ICD-10-CM | POA: Diagnosis present

## 2019-09-27 DIAGNOSIS — R296 Repeated falls: Secondary | ICD-10-CM | POA: Diagnosis not present

## 2019-09-27 DIAGNOSIS — F329 Major depressive disorder, single episode, unspecified: Secondary | ICD-10-CM | POA: Diagnosis present

## 2019-09-27 DIAGNOSIS — R531 Weakness: Secondary | ICD-10-CM | POA: Diagnosis not present

## 2019-09-27 DIAGNOSIS — G9341 Metabolic encephalopathy: Secondary | ICD-10-CM | POA: Diagnosis present

## 2019-09-27 DIAGNOSIS — R000 Tachycardia, unspecified: Secondary | ICD-10-CM | POA: Diagnosis present

## 2019-09-27 DIAGNOSIS — E785 Hyperlipidemia, unspecified: Secondary | ICD-10-CM | POA: Diagnosis present

## 2019-09-27 DIAGNOSIS — R5383 Other fatigue: Secondary | ICD-10-CM | POA: Diagnosis not present

## 2019-09-27 DIAGNOSIS — Z853 Personal history of malignant neoplasm of breast: Secondary | ICD-10-CM | POA: Diagnosis not present

## 2019-09-27 DIAGNOSIS — Z79899 Other long term (current) drug therapy: Secondary | ICD-10-CM | POA: Diagnosis not present

## 2019-09-27 LAB — COMPREHENSIVE METABOLIC PANEL
ALT: 13 U/L (ref 0–44)
AST: 16 U/L (ref 15–41)
Albumin: 2.8 g/dL — ABNORMAL LOW (ref 3.5–5.0)
Alkaline Phosphatase: 84 U/L (ref 38–126)
Anion gap: 12 (ref 5–15)
BUN: 20 mg/dL (ref 8–23)
CO2: 21 mmol/L — ABNORMAL LOW (ref 22–32)
Calcium: 8.1 mg/dL — ABNORMAL LOW (ref 8.9–10.3)
Chloride: 106 mmol/L (ref 98–111)
Creatinine, Ser: 0.62 mg/dL (ref 0.44–1.00)
GFR calc Af Amer: >60 mL/min (ref >60)
GFR calc non Af Amer: >60 mL/min (ref >60)
Glucose, Bld: 117 mg/dL — ABNORMAL HIGH (ref 70–99)
Potassium: 3.6 mmol/L (ref 3.5–5.1)
Sodium: 139 mmol/L (ref 135–145)
Total Bilirubin: 1 mg/dL (ref 0.3–1.2)
Total Protein: 6.1 g/dL — ABNORMAL LOW (ref 6.5–8.1)

## 2019-09-27 LAB — CBC WITH DIFFERENTIAL/PLATELET
Abs Immature Granulocytes: 0.04 10*3/uL (ref 0.00–0.07)
Basophils Absolute: 0 10*3/uL (ref 0.0–0.1)
Basophils Relative: 0 %
Eosinophils Absolute: 0.1 10*3/uL (ref 0.0–0.5)
Eosinophils Relative: 1 %
HCT: 29.1 % — ABNORMAL LOW (ref 36.0–46.0)
Hemoglobin: 9.6 g/dL — ABNORMAL LOW (ref 12.0–15.0)
Immature Granulocytes: 1 %
Lymphocytes Relative: 5 %
Lymphs Abs: 0.4 10*3/uL — ABNORMAL LOW (ref 0.7–4.0)
MCH: 29.1 pg (ref 26.0–34.0)
MCHC: 33 g/dL (ref 30.0–36.0)
MCV: 88.2 fL (ref 80.0–100.0)
Monocytes Absolute: 0.6 10*3/uL (ref 0.1–1.0)
Monocytes Relative: 7 %
Neutro Abs: 7.2 10*3/uL (ref 1.7–7.7)
Neutrophils Relative %: 86 %
Platelets: 219 10*3/uL (ref 150–400)
RBC: 3.3 MIL/uL — ABNORMAL LOW (ref 3.87–5.11)
RDW: 12.9 % (ref 11.5–15.5)
WBC: 8.3 10*3/uL (ref 4.0–10.5)
nRBC: 0 % (ref 0.0–0.2)

## 2019-09-27 LAB — FIBRIN DERIVATIVES D-DIMER (ARMC ONLY): Fibrin derivatives D-dimer (ARMC): 2857.37 ng/mL (FEU) — ABNORMAL HIGH (ref 0.00–499.00)

## 2019-09-27 LAB — SARS CORONAVIRUS 2 (TAT 6-24 HRS): SARS Coronavirus 2: NEGATIVE

## 2019-09-27 LAB — STREP PNEUMONIAE URINARY ANTIGEN: Strep Pneumo Urinary Antigen: NEGATIVE

## 2019-09-27 LAB — C-REACTIVE PROTEIN: CRP: 13.7 mg/dL — ABNORMAL HIGH (ref <1.0)

## 2019-09-27 MED ORDER — SODIUM CHLORIDE 0.9 % IV BOLUS
500.0000 mL | Freq: Once | INTRAVENOUS | Status: AC
  Administered 2019-09-27: 500 mL via INTRAVENOUS

## 2019-09-27 MED ORDER — IOHEXOL 350 MG/ML SOLN
75.0000 mL | Freq: Once | INTRAVENOUS | Status: AC | PRN
  Administered 2019-09-27: 75 mL via INTRAVENOUS

## 2019-09-27 MED ORDER — LACTATED RINGERS IV SOLN: INTRAVENOUS | Status: DC

## 2019-09-27 MED ORDER — SODIUM CHLORIDE 0.9 % IV SOLN
2.0000 g | Freq: Every day | INTRAVENOUS | Status: DC
  Filled 2019-09-27: qty 20

## 2019-09-27 MED ORDER — SODIUM CHLORIDE 0.9 % IV SOLN
2.0000 g | INTRAVENOUS | Status: DC
  Administered 2019-09-27: 2 g via INTRAVENOUS
  Filled 2019-09-27: qty 2
  Filled 2019-09-27: qty 20

## 2019-09-27 MED ORDER — SODIUM CHLORIDE 0.9 % IV SOLN
500.0000 mg | INTRAVENOUS | Status: DC
  Administered 2019-09-27: 500 mg via INTRAVENOUS
  Filled 2019-09-27 (×2): qty 500

## 2019-09-27 NOTE — Progress Notes (Signed)
NT assisted patient to bathroom, pt felt dizzy and" weak in the knees". HR elevated with activity, VS taken on return to bed. Temp 103.  On call provider made aware.  Tylenol given to fever, IVF bolus initiated as ordered.

## 2019-09-27 NOTE — Progress Notes (Signed)
    BRIEF OVERNIGHT PROGRESS REPORT   SUBJECTIVE: Patient had a run of nonsustained narrow complex tachycardia with a rate of 160 bpm asymptomatic. She was assisted to the bathroom and c/o dizziness and " weakness in the knee". VS shows tachycardia and temp of 103.  OBJECTIVE: On exam she is febrile temp 103.1 with blood pressure 127/85 mm Hg and pulse rate 96 beats/min. Oxygen saturation of 100% on 2L via nasal cannula. There were no focal neurological deficits; she was alert and oriented x4.  ASSESSMENT: 84 y.o female with pertinent hx of  Diverticulosis and hypertension presenting with generalized malaise, falls due to weakness, congestion and fever.  PLAN: 1. Multifocal pneumonia - Patient presenting with Fever, congestion and generalized weakness. Initial concerns for COVID -19 infection however her COVID test is negative thus far. - Chest xray compatible with multifocal pneumonia possibly atypical.viral etiology - Blood cultures pending - UA negative for UTI - D-dimer and inflammatory markers elevated raising suspicion for possible COVID despite negative test - Check CT angio chest r/o PE though have low suspicion for PE - Procalcitonin and WBC normal will continue to trend - Monitor fever curve - IVFs hydration - Start Empiric Abx with ceftriaxone and Azithromycin pending cultures above  2. HTN  + Goal BP <130/80 - Holding home telmisartan due to soft pressures  3. DVT prophylaxis - Enoxaparin SubQ      Rufina Falco, DNP, CCRN, FNP-C [facility] Nurse Practitioner Between 7pm to [place] - Pager [phone]  After 7am go to [email or web address] - [email or web address] select [facility]  [facility] [employer]  [phone]

## 2019-09-27 NOTE — Progress Notes (Signed)
[facility] Progress Note  Patient: Elaine Price [id number]   PCP: McLean-Scocuzza, Nino Glow, MD DOB: 05/05/1935   DOA: 09/26/2019   DOS: 09/27/2019   Date of Service: the patient was seen and examined on 09/27/2019  Chief Complaint  Patient presents with   Altered Mental Status   Brief hospital course: Elaine Price is a 84 y.o. female with medical history significant for diverticulosis and hypertension who has been in her usual state of health and has been staying isolated and very cautious about wearing a mask when she started having symptoms approximately 5 days ago of congestion and fatigue to the point where she is even had multiple falls.  No loss of consciousness.  She started developing a cough approximately 5 days back.  Patient went to get an outpatient Covid test yesterday, results are pending.  Overall, she just does not feel well and at her daughter's urging, came into the emergency room today.  Patient's daughter notes some episodes of confusion  Currently further plan is continue further work-up and IV antibiotics.  Subjective: Continues to have cough as well as shortness of breath.  No chest pain abdominal pain.  No nausea no vomiting.  Feeling actually somewhat improved than yesterday.  Assessment and Plan: Scheduled Meds:  enoxaparin (LOVENOX) injection  40 mg Subcutaneous Q12H   Continuous Infusions:  sodium chloride 50 mL/hr at 09/27/19 0509   azithromycin 500 mg (09/27/19 1848)   cefTRIAXone (ROCEPHIN)  IV 2 g (09/27/19 1755)   PRN Meds: acetaminophen **OR** acetaminophen, ondansetron **OR** ondansetron (ZOFRAN) IV  1.  Community-acquired pneumonia Suspected COVID-19 viral infection Bilateral infiltrates seen on the CT scan Pulmonary embolism ruled out. 2 Covid test so far are negative. Bilateral patchy infiltrate as well as hypoxia as well as inflammatory markers elevated concerning for Covid 19 infection. We will send out a  [facility] test. Continue with IV ceftriaxone and azithromycin. Follow-up on cultures. Follow-up on strep pneumonia antigen as well as Legionella antigen. Procalcitonin level was negative yesterday we will recheck tomorrow.   2.  Essential hypertension. Blood pressure stable. Hold medication. We will start IV fluids.  3.  Sinus tachycardia IV fluids. Monitor on telemetry.  4.  Hyperlipidemia Continue home regimen.  5.  History of breast cancer. 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 SP radiation. Patient is on tamoxifen. Currently holding the treatment.  Diet: Regular diet  DVT Prophylaxis: Subcutaneous Lovenox   Advance goals of care discussion: Full code  Family Communication: no family was present at bedside, at the time of interview.  Discussed with family on the phone, the pt provided permission to discuss medical plan with the family. Opportunity was given to ask question and all questions were answered satisfactorily.   Disposition:  Discharge to home.  Consultants: none Procedures: none  Antibiotics: Anti-infectives (From admission, onward)   Start     Dose/Rate Route Frequency Ordered Stop   09/27/19 1800  azithromycin (ZITHROMAX) 500 mg in sodium chloride 0.9 % 250 mL IVPB     500 mg 250 mL/hr over 60 Minutes Intravenous Every 24 hours 09/27/19 0756     09/27/19 1800  cefTRIAXone (ROCEPHIN) 2 g in sodium chloride 0.9 % 100 mL IVPB     2 g 200 mL/hr over 30 Minutes Intravenous Every 24 hours 09/27/19 0758     09/27/19 1500  cefTRIAXone (ROCEPHIN) 2 g in sodium chloride 0.9 % 100 mL IVPB  Status:  Discontinued  2 g 200 mL/hr over 30 Minutes Intravenous Every 24 hours 09/26/19 2144 09/27/19 0752   09/27/19 0800  cefTRIAXone (ROCEPHIN) 2 g in sodium chloride 0.9 % 100 mL IVPB  Status:  Discontinued     2 g 200 mL/hr over 30 Minutes Intravenous Daily 09/27/19 0756 09/27/19 0758   09/26/19 2300  azithromycin (ZITHROMAX) 500 mg in sodium  chloride 0.9 % 250 mL IVPB  Status:  Discontinued     500 mg 250 mL/hr over 60 Minutes Intravenous Every 24 hours 09/26/19 2144 09/27/19 0752   09/26/19 1430  cefTRIAXone (ROCEPHIN) 2 g in sodium chloride 0.9 % 100 mL IVPB  Status:  Discontinued     2 g 200 mL/hr over 30 Minutes Intravenous  Once 09/26/19 1416 09/26/19 1442   09/26/19 1430  azithromycin (ZITHROMAX) 500 mg in sodium chloride 0.9 % 250 mL IVPB  Status:  Discontinued     500 mg 250 mL/hr over 60 Minutes Intravenous  Once 09/26/19 1416 09/26/19 1442       Objective: Physical Exam: Vitals:   09/27/19 0505 09/27/19 0739 09/27/19 1508 09/27/19 1800  BP: (!) 114/51 (!) 102/49 (!) 124/43   Pulse: 78 66 84   Resp: (!) 22 18 18    Temp: 98.1 F (36.7 C) 98.5 F (36.9 C) (!) 102.9 F (39.4 C) 97.9 F (36.6 C)  TempSrc: Oral Oral Oral Oral  SpO2: 99% 97% 99%   Weight:      Height:        Intake/Output Summary (Last 24 hours) at 09/27/2019 1916 Last data filed at 09/27/2019 1730 Gross per 24 hour  Intake 1022.95 ml  Output 850 ml  Net 172.95 ml   Filed Weights   09/26/19 1243 09/26/19 1843 09/27/19 0455  Weight: 63.5 kg 68.6 kg 69 kg   General: alert and oriented to time, place, and person. Appear in mild distress, affect appropriate Eyes: PERRL, Conjunctiva normal ENT: Oral Mucosa Clear, moist  Neck: no JVD, no Abnormal Mass Or lumps Cardiovascular: S1 and S2 Present, no Murmur,  Respiratory: good respiratory effort, Bilateral Air entry equal and Decreased, no signs of accessory muscle use, bilateral  Crackles, no wheezes Abdomen: Bowel Sound present, Soft and no tenderness, no hernia Skin: no rashes  Extremities: no Pedal edema, no calf tenderness Neurologic: without any new focal findings Gait not checked due to patient safety concerns  Data Reviewed: I have personally reviewed and interpreted daily labs, tele strips, imagings as discussed above. I reviewed all nursing notes, pharmacy notes, vitals,  pertinent old records I have discussed plan of care as described above with RN and patient/family.  CBC: Recent Labs  Lab 09/26/19 1247 09/27/19 0521  WBC 9.3 8.3  NEUTROABS 8.1* 7.2  HGB 10.4* 9.6*  HCT 31.2* 29.1*  MCV 86.4 88.2  PLT 235 [id number]   Basic Metabolic Panel: Recent Labs  Lab 09/26/19 1247 09/27/19 0521  NA 134* 139  K 3.9 3.6  CL 101 106  CO2 21* 21*  GLUCOSE 129* 117*  BUN 26* 20  CREATININE 0.92 0.62  CALCIUM 8.2* 8.1*    Liver Function Tests: Recent Labs  Lab 09/26/19 1247 09/27/19 0521  AST 20 16  ALT 15 13  ALKPHOS 99 84  BILITOT 1.2 1.0  PROT 7.0 6.1*  ALBUMIN 3.4* 2.8*   No results for input(s): LIPASE, AMYLASE in the last 168 hours. No results for input(s): AMMONIA in the last 168 hours. Coagulation Profile: No results for input(s): INR, PROTIME in  the last 168 hours. Cardiac Enzymes: No results for input(s): CKTOTAL, CKMB, CKMBINDEX, TROPONINI in the last 168 hours. BNP (last 3 results) No results for input(s): PROBNP in the last 8760 hours. CBG: No results for input(s): GLUCAP in the last 168 hours. Studies: Ct Angio Chest Pe W Or Wo Contrast  Result Date: 09/27/2019 CLINICAL DATA:  Multiple falls and feeling fatigued. PE suspected. EXAM: CT ANGIOGRAPHY CHEST WITH CONTRAST TECHNIQUE: Multidetector CT imaging of the chest was performed using the standard protocol during bolus administration of intravenous contrast. Multiplanar CT image reconstructions and MIPs were obtained to evaluate the vascular anatomy. CONTRAST:  62mL OMNIPAQUE IOHEXOL 350 MG/ML SOLN COMPARISON:  None. FINDINGS: Cardiovascular: Heart is enlarged. No pericardial effusion. Coronary artery calcification is evident. Atherosclerotic calcification is noted in the wall of the thoracic aorta. No filling defect within the opacified pulmonary arteries to suggest the presence of an acute pulmonary embolus. Mediastinum/Nodes: Mediastinal lymph nodes measure up to 8 mm short axis in  the pre-vascular window. 9 mm short axis subcarinal lymph node. The esophagus has normal imaging features. Surgical clips are noted in the left axilla. Lungs/Pleura: Lungs demonstrate bilateral patchy ground-glass infiltrates peripherally in both lungs with an upper lung predominance. No pleural effusion. Upper Abdomen: 2.6 cm low-density lesion in the left liver approaches water attenuation and is compatible with a cyst. Musculoskeletal: No worrisome lytic or sclerotic osseous abnormality. Review of the MIP images confirms the above findings. IMPRESSION: 1. No CT evidence for acute pulmonary embolus. 2. Bilateral patchy ground-glass infiltrates peripherally in both lungs with an upper lung predominance. Imaging features are nonspecific but likely related to infectious/inflammatory etiology and atypical or viral pneumonia would be a consideration. 3. Borderline mediastinal lymphadenopathy, likely reactive. 4. Aortic Atherosclerosis (ICD10-I70.0). Electronically Signed   By: Misty Stanley M.D.   On: 09/27/2019 07:35     Time spent: 35 minutes  Author: Berle Mull, MD [facility] 09/27/2019 7:16 PM  To reach On-call, see care teams to locate the attending and reach out to them via [email or web address]. If 7PM-7AM, please contact night-coverage If you still have difficulty reaching the attending provider, please page the [facility] (Director on Call) for [facility] on amion for assistance.

## 2019-09-28 LAB — COMPREHENSIVE METABOLIC PANEL
ALT: 12 U/L (ref 0–44)
AST: 14 U/L — ABNORMAL LOW (ref 15–41)
Albumin: 2.5 g/dL — ABNORMAL LOW (ref 3.5–5.0)
Alkaline Phosphatase: 74 U/L (ref 38–126)
Anion gap: 10 (ref 5–15)
BUN: 12 mg/dL (ref 8–23)
CO2: 20 mmol/L — ABNORMAL LOW (ref 22–32)
Calcium: 8 mg/dL — ABNORMAL LOW (ref 8.9–10.3)
Chloride: 110 mmol/L (ref 98–111)
Creatinine, Ser: 0.6 mg/dL (ref 0.44–1.00)
GFR calc Af Amer: >60 mL/min (ref >60)
GFR calc non Af Amer: >60 mL/min (ref >60)
Glucose, Bld: 91 mg/dL (ref 70–99)
Potassium: 3.6 mmol/L (ref 3.5–5.1)
Sodium: 140 mmol/L (ref 135–145)
Total Bilirubin: 0.5 mg/dL (ref 0.3–1.2)
Total Protein: 5.6 g/dL — ABNORMAL LOW (ref 6.5–8.1)

## 2019-09-28 LAB — CBC WITH DIFFERENTIAL/PLATELET
Abs Immature Granulocytes: 0.03 10*3/uL (ref 0.00–0.07)
Basophils Absolute: 0 10*3/uL (ref 0.0–0.1)
Basophils Relative: 0 %
Eosinophils Absolute: 0.3 10*3/uL (ref 0.0–0.5)
Eosinophils Relative: 4 %
HCT: 29.2 % — ABNORMAL LOW (ref 36.0–46.0)
Hemoglobin: 9.2 g/dL — ABNORMAL LOW (ref 12.0–15.0)
Immature Granulocytes: 0 %
Lymphocytes Relative: 8 %
Lymphs Abs: 0.6 10*3/uL — ABNORMAL LOW (ref 0.7–4.0)
MCH: 28.5 pg (ref 26.0–34.0)
MCHC: 31.5 g/dL (ref 30.0–36.0)
MCV: 90.4 fL (ref 80.0–100.0)
Monocytes Absolute: 0.7 10*3/uL (ref 0.1–1.0)
Monocytes Relative: 10 %
Neutro Abs: 5.2 10*3/uL (ref 1.7–7.7)
Neutrophils Relative %: 78 %
Platelets: 233 10*3/uL (ref 150–400)
RBC: 3.23 MIL/uL — ABNORMAL LOW (ref 3.87–5.11)
RDW: 12.7 % (ref 11.5–15.5)
WBC: 6.7 10*3/uL (ref 4.0–10.5)
nRBC: 0 % (ref 0.0–0.2)

## 2019-09-28 LAB — NOVEL CORONAVIRUS, NAA (HOSP ORDER, SEND-OUT TO REF LAB; TAT 18-24 HRS): SARS-CoV-2, NAA: NOT DETECTED

## 2019-09-28 LAB — LEGIONELLA PNEUMOPHILA SEROGP 1 UR AG: L. pneumophila Serogp 1 Ur Ag: NEGATIVE

## 2019-09-28 LAB — NOVEL CORONAVIRUS, NAA: SARS-CoV-2, NAA: NOT DETECTED

## 2019-09-28 LAB — PROCALCITONIN: Procalcitonin: <0.1 ng/mL

## 2019-09-28 LAB — FIBRIN DERIVATIVES D-DIMER (ARMC ONLY): Fibrin derivatives D-dimer (ARMC): 2162.81 ng/mL (FEU) — ABNORMAL HIGH (ref 0.00–499.00)

## 2019-09-28 LAB — C-REACTIVE PROTEIN: CRP: 12.6 mg/dL — ABNORMAL HIGH (ref <1.0)

## 2019-09-28 MED ORDER — ZOLPIDEM TARTRATE 5 MG PO TABS
5.0000 mg | ORAL_TABLET | Freq: Every evening | ORAL | Status: DC | PRN
  Administered 2019-09-28: 5 mg via ORAL
  Filled 2019-09-28: qty 1

## 2019-09-28 MED ORDER — ENOXAPARIN SODIUM 40 MG/0.4ML ~~LOC~~ SOLN
40.0000 mg | SUBCUTANEOUS | Status: DC
  Administered 2019-09-29 – 2019-09-30 (×2): 40 mg via SUBCUTANEOUS
  Filled 2019-09-28 (×2): qty 0.4

## 2019-09-28 MED ORDER — CEPHALEXIN 500 MG PO CAPS
500.0000 mg | ORAL_CAPSULE | Freq: Three times a day (TID) | ORAL | Status: DC
  Administered 2019-09-28: 500 mg via ORAL
  Filled 2019-09-28: qty 1

## 2019-09-28 MED ORDER — GUAIFENESIN ER 600 MG PO TB12
600.0000 mg | ORAL_TABLET | Freq: Two times a day (BID) | ORAL | Status: DC
  Administered 2019-09-28 – 2019-09-30 (×5): 600 mg via ORAL
  Filled 2019-09-28 (×5): qty 1

## 2019-09-28 MED ORDER — BENZONATATE 100 MG PO CAPS
100.0000 mg | ORAL_CAPSULE | Freq: Two times a day (BID) | ORAL | Status: DC
  Administered 2019-09-28 – 2019-09-30 (×5): 100 mg via ORAL
  Filled 2019-09-28 (×5): qty 1

## 2019-09-28 MED ORDER — CEFDINIR 300 MG PO CAPS
300.0000 mg | ORAL_CAPSULE | Freq: Two times a day (BID) | ORAL | Status: DC
  Administered 2019-09-28 – 2019-09-30 (×4): 300 mg via ORAL
  Filled 2019-09-28 (×5): qty 1

## 2019-09-28 MED ORDER — AZITHROMYCIN 250 MG PO TABS
500.0000 mg | ORAL_TABLET | Freq: Every day | ORAL | Status: AC
  Administered 2019-09-28 – 2019-09-30 (×3): 500 mg via ORAL
  Filled 2019-09-28 (×3): qty 2

## 2019-09-28 NOTE — Plan of Care (Signed)
  Problem: Clinical Measurements: Goal: Diagnostic test results will improve Outcome: Progressing Goal: Respiratory complications will improve Outcome: Progressing   Problem: Safety: Goal: Ability to remain free from injury will improve Outcome: Progressing   

## 2019-09-28 NOTE — Progress Notes (Signed)
[facility] Progress Note  Patient: Elaine Price [id number]   PCP: McLean-Scocuzza, Nino Glow, MD DOB: [birth year]   DOA: 09/26/2019   DOS: 09/28/2019   Date of Service: the patient was seen and examined on 09/28/2019  Chief Complaint  Patient presents with  . Altered Mental Status   Brief hospital course: Elaine Price is a 84 y.o. female with medical history significant for diverticulosis and hypertension who has been in her usual state of health and has been staying isolated and very cautious about wearing a mask when she started having symptoms approximately 5 days ago of congestion and fatigue to the point where she is even had multiple falls.  No loss of consciousness.  She started developing a cough approximately 5 days back.  Patient went to get an outpatient Covid test yesterday, results are pending.  Overall, she just does not feel well and at her daughter's urging, came into the emergency room today.  Patient's daughter notes some episodes of confusion.  Currently further plan is continue further work-up and IV antibiotics.  Subjective: no acute complains, has some shortness of breath, cough so headache.  Assessment and Plan: Scheduled Meds: . azithromycin  500 mg Oral Daily  . benzonatate  100 mg Oral BID  . cefdinir  300 mg Oral Q12H  . [START ON 09/29/2019] enoxaparin (LOVENOX) injection  40 mg Subcutaneous Q24H  . guaiFENesin  600 mg Oral BID   Continuous Infusions:  PRN Meds: acetaminophen **OR** acetaminophen, ondansetron **OR** ondansetron (ZOFRAN) IV, zolpidem  1.  Community-acquired pneumonia Suspected COVID-19 viral infection Bilateral infiltrates seen on the CT scan Pulmonary embolism ruled out. 2 Covid test so far are negative. Bilateral patchy infiltrate as well as hypoxia as well as inflammatory markers elevated concerning for Covid 19 infection. We will send out a [facility] test. Continue with IV ceftriaxone and azithromycin.  Follow-up on cultures. Follow-up on strep pneumonia antigen as well as Legionella antigen. Procalcitonin level was negative yesterday we will recheck tomorrow.  2.  Essential hypertension. Blood pressure stable. Hold medication. We will start IV fluids.  3.  Sinus tachycardia IV fluids. Monitor on telemetry.  4.  Hyperlipidemia Continue home regimen.  5.  History of breast cancer. 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 SP radiation. Patient is on tamoxifen. Currently holding the treatment.  6. Anemia unspecified  Hb 13 on 11/6 to 11/23 9.2. Unclear etiology likely the drop is secondary to dilution after receiving IV hydration. No active bleeding reported by the patient. We will continue to monitor for now. We will perform further work-up tomorrow. Continue DVT prophylaxis.  Diet: Regular diet  DVT Prophylaxis: Subcutaneous Lovenox   Advance goals of care discussion: Full code  Family Communication: no family was present at bedside, at the time of interview.  Discussed with family on the phone, the pt provided permission to discuss medical plan with the family. Opportunity was given to ask question and all questions were answered satisfactorily.   Disposition:  Discharge to home.  Consultants: none Procedures: none  Antibiotics: Anti-infectives (From admission, onward)   Start     Dose/Rate Route Frequency Ordered Stop   09/28/19 1700  cefdinir (OMNICEF) capsule 300 mg     300 mg Oral Every 12 hours 09/28/19 1500     09/28/19 1400  cephALEXin (KEFLEX) capsule 500 mg  Status:  Discontinued     500 mg Oral Every 8 hours 09/28/19 1301 09/28/19 1500   09/28/19 1400  azithromycin [facility]) tablet 500 mg     500 mg Oral Daily 09/28/19 1301 10/01/19 0959   09/27/19 1800  azithromycin (ZITHROMAX) 500 mg in sodium chloride 0.9 % 250 mL IVPB  Status:  Discontinued     500 mg 250 mL/hr over 60 Minutes Intravenous Every 24 hours 09/27/19 0756 09/28/19  1300   09/27/19 1800  cefTRIAXone (ROCEPHIN) 2 g in sodium chloride 0.9 % 100 mL IVPB  Status:  Discontinued     2 g 200 mL/hr over 30 Minutes Intravenous Every 24 hours 09/27/19 0758 09/28/19 1300   09/27/19 1500  cefTRIAXone (ROCEPHIN) 2 g in sodium chloride 0.9 % 100 mL IVPB  Status:  Discontinued     2 g 200 mL/hr over 30 Minutes Intravenous Every 24 hours 09/26/19 2144 09/27/19 0752   09/27/19 0800  cefTRIAXone (ROCEPHIN) 2 g in sodium chloride 0.9 % 100 mL IVPB  Status:  Discontinued     2 g 200 mL/hr over 30 Minutes Intravenous Daily 09/27/19 0756 09/27/19 0758   09/26/19 2300  azithromycin (ZITHROMAX) 500 mg in sodium chloride 0.9 % 250 mL IVPB  Status:  Discontinued     500 mg 250 mL/hr over 60 Minutes Intravenous Every 24 hours 09/26/19 2144 09/27/19 0752   09/26/19 1430  cefTRIAXone (ROCEPHIN) 2 g in sodium chloride 0.9 % 100 mL IVPB  Status:  Discontinued     2 g 200 mL/hr over 30 Minutes Intravenous  Once 09/26/19 1416 09/26/19 1442   09/26/19 1430  azithromycin (ZITHROMAX) 500 mg in sodium chloride 0.9 % 250 mL IVPB  Status:  Discontinued     500 mg 250 mL/hr over 60 Minutes Intravenous  Once 09/26/19 1416 09/26/19 1442       Objective: Physical Exam: Vitals:   09/28/19 0511 09/28/19 0846 09/28/19 1009 09/28/19 1612  BP: (!) 118/53 (!) 130/58 (!) 122/52 (!) 153/70  Pulse: 82 84 88 79  Resp: 18  19 20   Temp: 99.3 F (37.4 C) 98.9 F (37.2 C) 98.3 F (36.8 C) 99.4 F (37.4 C)  TempSrc: Oral Oral  Oral  SpO2: 97% 100% 99% 100%  Weight: 67 kg     Height:        Intake/Output Summary (Last 24 hours) at 09/28/2019 1841 Last data filed at 09/28/2019 1500 Gross per 24 hour  Intake 2042.5 ml  Output 700 ml  Net 1342.5 ml   Filed Weights   09/26/19 1843 09/27/19 0455 09/28/19 0511  Weight: 68.6 kg 69 kg 67 kg   General: alert and oriented to time, place, and person. Appear in mild distress, affect appropriate Eyes: PERRL, Conjunctiva normal ENT: Oral Mucosa  Clear, moist  Neck: no JVD, no Abnormal Mass Or lumps Cardiovascular: S1 and S2 Present, no Murmur,  Respiratory: good respiratory effort, Bilateral Air entry equal and Decreased, no signs of accessory muscle use, bilateral  Crackles, no wheezes Abdomen: Bowel Sound present, Soft and no tenderness, no hernia Skin: no rashes  Extremities: no Pedal edema, no calf tenderness Neurologic: without any new focal findings Gait not checked due to patient safety concerns  Data Reviewed: I have personally reviewed and interpreted daily labs, tele strips, imagings as discussed above. I reviewed all nursing notes, pharmacy notes, vitals, pertinent old records I have discussed plan of care as described above with RN and patient/family.  CBC: Recent Labs  Lab 09/26/19 1247 09/27/19 0521 09/28/19 0500  WBC 9.3 8.3 6.7  NEUTROABS 8.1* 7.2 5.2  HGB 10.4* 9.6* 9.2*  HCT 31.2* 29.1* 29.2*  MCV 86.4 88.2 90.4  PLT 235 219 0000000   Basic Metabolic Panel: Recent Labs  Lab 09/26/19 1247 09/27/19 0521 09/28/19 0500  NA 134* 139 140  K 3.9 3.6 3.6  CL 101 106 110  CO2 21* 21* 20*  GLUCOSE 129* 117* 91  BUN 26* 20 12  CREATININE 0.92 0.62 0.60  CALCIUM 8.2* 8.1* 8.0*    Liver Function Tests: Recent Labs  Lab 09/26/19 1247 09/27/19 0521 09/28/19 0500  AST 20 16 14*  ALT 15 13 12   ALKPHOS 99 84 74  BILITOT 1.2 1.0 0.5  PROT 7.0 6.1* 5.6*  ALBUMIN 3.4* 2.8* 2.5*   No results for input(s): LIPASE, AMYLASE in the last 168 hours. No results for input(s): AMMONIA in the last 168 hours. Coagulation Profile: No results for input(s): INR, PROTIME in the last 168 hours. Cardiac Enzymes: No results for input(s): CKTOTAL, CKMB, CKMBINDEX, TROPONINI in the last 168 hours. BNP (last 3 results) No results for input(s): PROBNP in the last 8760 hours. CBG: No results for input(s): GLUCAP in the last 168 hours. Studies: No results found.   Time spent: 35 minutes  Author: Berle Mull, MD  [facility] 09/28/2019 6:41 PM  To reach On-call, see care teams to locate the attending and reach out to them via [email or web address]. If 7PM-7AM, please contact night-coverage If you still have difficulty reaching the attending provider, please page the [facility] (Director on Call) for [facility] on amion for assistance.

## 2019-09-29 DIAGNOSIS — J700 Acute pulmonary manifestations due to radiation: Secondary | ICD-10-CM

## 2019-09-29 LAB — CBC WITH DIFFERENTIAL/PLATELET
Abs Immature Granulocytes: 0.06 10*3/uL (ref 0.00–0.07)
Basophils Absolute: 0 10*3/uL (ref 0.0–0.1)
Basophils Relative: 1 %
Eosinophils Absolute: 0.2 10*3/uL (ref 0.0–0.5)
Eosinophils Relative: 4 %
HCT: 27.9 % — ABNORMAL LOW (ref 36.0–46.0)
Hemoglobin: 9.1 g/dL — ABNORMAL LOW (ref 12.0–15.0)
Immature Granulocytes: 1 %
Lymphocytes Relative: 8 %
Lymphs Abs: 0.5 10*3/uL — ABNORMAL LOW (ref 0.7–4.0)
MCH: 28.7 pg (ref 26.0–34.0)
MCHC: 32.6 g/dL (ref 30.0–36.0)
MCV: 88 fL (ref 80.0–100.0)
Monocytes Absolute: 0.5 10*3/uL (ref 0.1–1.0)
Monocytes Relative: 9 %
Neutro Abs: 4.7 10*3/uL (ref 1.7–7.7)
Neutrophils Relative %: 77 %
Platelets: 247 10*3/uL (ref 150–400)
RBC: 3.17 MIL/uL — ABNORMAL LOW (ref 3.87–5.11)
RDW: 12.8 % (ref 11.5–15.5)
WBC: 6 10*3/uL (ref 4.0–10.5)
nRBC: 0 % (ref 0.0–0.2)

## 2019-09-29 LAB — IRON AND TIBC
Iron: 19 ug/dL — ABNORMAL LOW (ref 28–170)
Saturation Ratios: 10 % — ABNORMAL LOW (ref 10.4–31.8)
TIBC: 186 ug/dL — ABNORMAL LOW (ref 250–450)
UIBC: 167 ug/dL

## 2019-09-29 LAB — COMPREHENSIVE METABOLIC PANEL
ALT: 11 U/L (ref 0–44)
AST: 14 U/L — ABNORMAL LOW (ref 15–41)
Albumin: 2.5 g/dL — ABNORMAL LOW (ref 3.5–5.0)
Alkaline Phosphatase: 67 U/L (ref 38–126)
Anion gap: 10 (ref 5–15)
BUN: 10 mg/dL (ref 8–23)
CO2: 24 mmol/L (ref 22–32)
Calcium: 8.2 mg/dL — ABNORMAL LOW (ref 8.9–10.3)
Chloride: 107 mmol/L (ref 98–111)
Creatinine, Ser: 0.53 mg/dL (ref 0.44–1.00)
GFR calc Af Amer: >60 mL/min (ref >60)
GFR calc non Af Amer: >60 mL/min (ref >60)
Glucose, Bld: 96 mg/dL (ref 70–99)
Potassium: 3.7 mmol/L (ref 3.5–5.1)
Sodium: 141 mmol/L (ref 135–145)
Total Bilirubin: 0.6 mg/dL (ref 0.3–1.2)
Total Protein: 5.6 g/dL — ABNORMAL LOW (ref 6.5–8.1)

## 2019-09-29 LAB — RETICULOCYTES
Immature Retic Fract: 10.4 % (ref 2.3–15.9)
RBC.: 3.17 MIL/uL — ABNORMAL LOW (ref 3.87–5.11)
Retic Count, Absolute: 21.9 10*3/uL (ref 19.0–186.0)
Retic Ct Pct: 0.7 % (ref 0.4–3.1)

## 2019-09-29 LAB — TECHNOLOGIST SMEAR REVIEW: Plt Morphology: ADEQUATE

## 2019-09-29 LAB — C-REACTIVE PROTEIN: CRP: 11.6 mg/dL — ABNORMAL HIGH (ref <1.0)

## 2019-09-29 LAB — VITAMIN B12: Vitamin B-12: 1333 pg/mL — ABNORMAL HIGH (ref 180–914)

## 2019-09-29 LAB — INFLUENZA PANEL BY PCR (TYPE A & B)
Influenza A By PCR: NEGATIVE
Influenza B By PCR: NEGATIVE

## 2019-09-29 LAB — LACTATE DEHYDROGENASE: LDH: 154 U/L (ref 98–192)

## 2019-09-29 LAB — PROCALCITONIN: Procalcitonin: <0.1 ng/mL

## 2019-09-29 LAB — FIBRIN DERIVATIVES D-DIMER (ARMC ONLY): Fibrin derivatives D-dimer (ARMC): 2256.01 ng/mL (FEU) — ABNORMAL HIGH (ref 0.00–499.00)

## 2019-09-29 MED ORDER — PREDNISONE 50 MG PO TABS: 50.0000 mg | ORAL_TABLET | Freq: Every day | ORAL | Status: DC

## 2019-09-29 MED ORDER — DEXAMETHASONE SODIUM PHOSPHATE 10 MG/ML IJ SOLN
10.0000 mg | Freq: Once | INTRAMUSCULAR | Status: AC
  Administered 2019-09-29: 10 mg via INTRAVENOUS
  Filled 2019-09-29: qty 1

## 2019-09-29 MED ORDER — PREDNISONE 20 MG PO TABS
20.0000 mg | ORAL_TABLET | Freq: Every day | ORAL | Status: DC
  Administered 2019-09-30: 20 mg via ORAL
  Filled 2019-09-29: qty 1

## 2019-09-29 MED ORDER — SODIUM CHLORIDE 0.9% FLUSH
3.0000 mL | Freq: Two times a day (BID) | INTRAVENOUS | Status: DC
  Administered 2019-09-29: 3 mL via INTRAVENOUS

## 2019-09-29 NOTE — Consult Note (Signed)
Name: Elaine Price MRN: [id number] DOB: 1935/04/27     CONSULTATION DATE: 09/29/2019 REFERRING MD : Loleta Books  CHIEF COMPLAINT: Shortness of breath  HISTORY OF PRESENT ILLNESS:  84 y.o. female with medical history significant for diverticulosis and hypertension who has been in her usual state of health - has been staying isolated and very cautious about wearing a mask when she started having symptoms approximately 5 prior to admission of chest congestion and fatigue to the point where she is even had multiple falls. Patient was admitted to isolation room on the presumption of Covid However patient tested twice for Covid negative Patient also tested for influenza a and B which was also negative  Patient was started on IV antibiotics and progressively has improved over the last 5 days Her procalcitonin has been normal throughout her hospitalization  Currently patient is alert awake following commands Patient symptoms have significantly improved She has minimal oxygen requirement at this time  CT scan explained to the patient and family member on the phone Patient with bilateral interstitial infiltrates in the setting of improved clinical signs symptoms Covid is less likely however patient may still have atypical infection along with radiation pneumonitis   PAST MEDICAL HISTORY :   has a past medical history of Anemia, Arthritis, Cancer ([place]), Depression, Diverticulosis, Essential hypertension, Gallstones, Gastritis and duodenitis, Hyperlipemia, Pneumonia, Seasonal allergies, and Wears glasses.  has a past surgical history that includes Abdominal hysterectomy; Tonsilectomy, adenoidectomy, bilateral myringotomy and tubes; Appendectomy; Cholecystectomy, laparoscopic (08/06/2011); Colonoscopy (02/18/2006); Cholecystectomy (2012); ORIF clavicular fracture (Right, 07/04/2015); Acromio-clavicular joint repair (Right, 07/04/2015); Breast biopsy (Right, 12/28/2018); Breast biopsy (Left,  05/28/2019); Breast lumpectomy with radioactive seed and sentinel lymph node biopsy (Left, 07/02/2019); and Radioactive seed guided excisional breast biopsy (Right, 07/02/2019). Prior to Admission medications   Medication Sig Start Date End Date Taking? Authorizing Provider  ALOE VERA PO Take 1 Dose by mouth daily as needed (indigestion).   Yes [provider]  NON FORMULARY Take 1-2 capsules by mouth daily. AlgaeCal Plus (Calcium ascorbate/ cholecalciferol/ menaquinone-7/ calcium/ magnesium/ boron)   Yes [provider]  NON FORMULARY Take 1 capsule by mouth daily. [place] Formula (Lutein/ Zeaxanthin isomers/ Saffron extract)   Yes [provider]  NON FORMULARY Take 1 capsule by mouth daily. LifeSeasons Anxie-T (Magnesium taurinate/ L-Theanine/ Ashwaganda/ GABA/ Kava Kava)   Yes [provider]  OVER THE COUNTER MEDICATION Take 1 capsule by mouth daily. Ultranol Total Bladder Support (Cranberry/ Wendee Copp leaf powder/ Butcher's Broom/ Lotus leaf/ Marshmallow root/ Juniper)   Yes [provider]  OVER THE COUNTER MEDICATION Take 3 tablets by mouth daily. Juvenon BloodFlow-7 (L-Citrulline/L-arginine)   Yes [provider]  Strontium Chloride CRYS Take 680 mg by mouth at bedtime.    Yes [provider]  tamoxifen (NOLVADEX) 20 MG tablet Take 1 tablet (20 mg total) by mouth daily. 09/14/19  Yes Corcoran, Drue Second, MD  telmisartan (MICARDIS) 40 MG tablet Take 1 tablet (40 mg total) by mouth daily. 06/08/19  Yes McLean-Scocuzza, Nino Glow, MD   Allergies  Allergen Reactions  . Lexapro [Escitalopram Oxalate] Itching    FAMILY HISTORY:  family history includes Heart attack in her mother; Hypertension in her brother; Lung cancer in her brother; Prostate cancer in her brother and father; Stroke in her mother. SOCIAL HISTORY:  reports that she has never smoked. She has never used smokeless tobacco. She reports current alcohol use. She  reports that she does not use drugs.  Review of Systems:  Gen:  Denies  fever, sweats, chills weigh loss  HEENT: Denies blurred vision, double vision, ear pain, eye pain, hearing loss, nose bleeds, sore throat Cardiac:  No dizziness, chest pain or heaviness, chest tightness,edema, No JVD Resp:   No cough, -sputum production, -shortness of breath,-wheezing, -hemoptysis,  Gi: Denies swallowing difficulty, stomach pain, nausea or vomiting, diarrhea, constipation, bowel incontinence Gu:  Denies bladder incontinence, burning urine Ext:   Denies Joint pain, stiffness or swelling Skin: Denies  skin rash, easy bruising or bleeding or hives Endoc:  Denies polyuria, polydipsia , polyphagia or weight change Psych:   Denies depression, insomnia or hallucinations  Other:  All other systems negative     VITAL SIGNS: Temp:  [97.9 F (36.6 C)-99.5 F (37.5 C)] 97.9 F (36.6 C) (11/24 1455) Pulse Rate:  [72-90] 90 (11/24 1455) Resp:  [18-20] 18 (11/24 1455) BP: (108-151)/(49-84) 128/84 (11/24 1455) SpO2:  [99 %-100 %] 99 % (11/24 1455) Weight:  [68.2 kg] 68.2 kg (11/24 0525)     SpO2: 99 % O2 Flow Rate (L/min): 2.5 L/min    Physical Examination:   GENERAL:NAD, no fevers, chills, no weakness no fatigue HEAD: Normocephalic, atraumatic.  EYES: PERLA, EOMI No scleral icterus.  MOUTH: Moist mucosal membrane.  EAR, NOSE, THROAT: Clear without exudates. No external lesions.  NECK: Supple.  PULMONARY: CTA B/L no wheezing, rhonchi, crackles CARDIOVASCULAR: S1 and S2. Regular rate and rhythm. No murmurs GASTROINTESTINAL: Soft, nontender, nondistended. Positive bowel sounds.  MUSCULOSKELETAL: No swelling, clubbing, or edema.  NEUROLOGIC: No gross focal neurological deficits. 5/5 strength all extremities SKIN: No ulceration, lesions, rashes, or cyanosis.  PSYCHIATRIC: Insight, judgment intact. -depression -anxiety ALL OTHER ROS ARE NEGATIVE   MEDICATIONS: I have reviewed all medications  and confirmed regimen as documented    CULTURE RESULTS   Recent Results (from the past 240 hour(s))  Novel Coronavirus, NAA ([facility])     Status: None   Collection Time: 09/25/19 10:39 AM   Specimen: Nasopharyngeal(NP) swabs in vial transport medium   NASOPHARYNGE  TESTING  Result Value Ref Range Status   SARS-CoV-2, NAA Not Detected Not Detected Final    Comment: Testing was performed using the cobas(R) SARS-CoV-2 test. This nucleic acid amplification test was developed and its performance characteristics determined by [employer], [employer] and [employer]. Nucleic acid amplification tests include PCR and TMA. This test has not been FDA cleared or approved. This test has been authorized by FDA under an Emergency Use Authorization (EUA). This test is only authorized for the duration of time the declaration that circumstances exist justifying the authorization of the emergency use of in vitro diagnostic tests for detection of SARS-CoV-2 virus and/or diagnosis of COVID-19 infection under section 564(b)(1) of the Act, 21 U.S.C. [id number]) (1), unless the authorization is terminated or revoked sooner. When diagnostic testing is negative, the possibility of a false negative result should be considered in the context of a patient's recent exposures and the presence of clinical signs and symptoms consistent with COVID-19. An individual without symptoms  of COVID-19 and who is not shedding SARS-CoV-2 virus would expect to have a negative (not detected) result in this assay.   SARS CORONAVIRUS 2 (TAT 6-24 HRS) Nasopharyngeal Nasopharyngeal Swab     Status: None   Collection Time: 09/26/19 12:28 PM   Specimen: Nasopharyngeal Swab  Result Value Ref Range Status   SARS Coronavirus 2 NEGATIVE NEGATIVE Final    Comment: (NOTE) SARS-CoV-2 target nucleic acids are NOT DETECTED. The SARS-CoV-2 RNA is generally  detectable in upper and lower respiratory specimens during the acute phase of infection. Negative  results do not preclude SARS-CoV-2 infection, do not rule out co-infections with other pathogens, and should not be used as the sole basis for treatment or other patient management decisions. Negative results must be combined with clinical observations, patient history, and epidemiological information. The expected result is Negative. Fact Sheet for Patients: SugarRoll.be Fact Sheet for Healthcare Providers: https://www.woods-mathews.com/ This test is not yet approved or cleared by the [place] FDA and  has been authorized for detection and/or diagnosis of SARS-CoV-2 by FDA under an Emergency Use Authorization (EUA). This EUA will remain  in effect (meaning this test can be used) for the duration of the COVID-19 declaration under Section 56 4(b)(1) of the Act, 21 U.S.C. section 360bbb-3(b)(1), unless the authorization is terminated or revoked sooner. Performed at [facility], [place] [address], [place], [place] [address]   Culture, blood (Routine x 2)     Status: None (Preliminary result)   Collection Time: 09/26/19 12:47 PM   Specimen: BLOOD  Result Value Ref Range Status   Specimen Description BLOOD BLOOD RIGHT FOREARM  Final   Special Requests   Final    BOTTLES DRAWN AEROBIC AND ANAEROBIC Blood Culture results may not be optimal due to an excessive volume of blood received in culture bottles   Culture   Final    NO GROWTH 2 DAYS Performed at [facility], [address], [place], [place] [address]    Report Status PENDING  Incomplete  Culture, blood (Routine x 2)     Status: None (Preliminary result)   Collection Time: 09/26/19  2:03 PM   Specimen: BLOOD  Result Value Ref Range Status   Specimen Description BLOOD BLOOD LEFT FOREARM  Final   Special Requests   Final    BOTTLES DRAWN AEROBIC AND ANAEROBIC Blood Culture results may not be optimal due to an excessive volume of blood received in culture bottles    Culture   Final    NO GROWTH 2 DAYS Performed at [facility], [address], [place], [place] [address]    Report Status PENDING  Incomplete  Novel Coronavirus, NAA (Hosp order, Send-out to Ref Lab; TAT 18-24 hrs     Status: None   Collection Time: 09/27/19 10:15 AM   Specimen: Nasopharyngeal Swab; Respiratory  Result Value Ref Range Status   SARS-CoV-2, NAA NOT DETECTED NOT DETECTED Final    Comment: (NOTE) This nucleic acid amplification test was developed and its performance characteristics determined by [employer], [employer] and [employer]. Nucleic acid amplification tests include PCR and TMA. This test has not been FDA cleared or approved. This test has been authorized by FDA under an Emergency Use Authorization (EUA). This test is only authorized for the duration of time the declaration that circumstances exist justifying the authorization of the emergency use of in vitro diagnostic tests for detection of SARS-CoV-2 virus and/or diagnosis of COVID-19 infection under section 564(b)(1) of the Act, 21 U.S.C. [id number]) (1), unless the authorization is terminated or revoked sooner. When diagnostic testing is negative, the possibility of a false negative result should be considered in the context of a patient's recent exposures and the presence of clinical signs and symptoms consistent with COVID-19. An individual without symptoms of COVID- 19 and who is not shedding SARS-CoV-2 vi rus would expect to have a negative (not detected) result in this assay. Performed At: [facility] [address] [place], [place] [id number]  Katina Degree MDPhD [id number]    Coronavirus Source NASOPHARYNGEAL  Final    Comment: Performed at [facility], [address] [place], [place] [address]   CT chest 09/29/2019 independently reviewed by me today Bilateral multifocal interstitial opacifications-likely representing atypical infection and inflammation    CT chest independently  reviewed by me today   ASSESSMENT AND PLAN SYNOPSIS 84 year old pleasant white female seen today for acute bilateral interstitial infiltrates multifocal in nature most likely due to probable underlying atypical infection(viral infection) ----patient is Covid negative and flu negative in the setting of recent completion of bilateral radiation therapy to both breasts initiated back in October which may likely have caused radiation pneumonitis.  At this time patient has significantly improved with antibiotics At this time I recommend continuing antibiotics for 5 days as prescribed and also continue prednisone therapy 20 mg daily for the next 14 days Patient will need close follow-up as outpatient with oncology and pulmonary and will need repeat CT chest for interval assessment  I also recommend incentive spirometry for aggressive bronchopulmonary hygiene Recommend ambulating pulse oximetry prior to discharge to assess for oxygen needs   Patient/Family are satisfied with Plan of action and management. All questions answered  Corrin Parker, M.D.  [facility]  Medical Director Clarissa Director [facility] Cardio-Pulmonary Department

## 2019-09-29 NOTE — Evaluation (Signed)
Physical Therapy Evaluation Patient Details Name: Elaine Price MRN: [id number] DOB: 07-31-1935 Today's Date: 09/29/2019   History of Present Illness  84 y.o. female with medical history significant for diverticulosis and hypertension who has been in her usual state of health and has been staying isolated and very cautious about wearing a mask when she started having symptoms.  Pt with ~5 days pre admit of weakness (multiple falls) and fatigue with congestion.  Pt has had multiple negative Covid tests since arrival.  Clinical Impression  Regarding mobility, strength, etc this pt did quite well.  Per nursing she had been having some unsteadiness and had been able to do very little activity.  Pt on 2L on arrival, stats >98%, PT was able to keep O2 >95% the entire session on room air and did not have any shortness of breath or c/o significant fatigue.  She normally does not need an AD and is able to walk multiple miles daily, she was clearly weaker than this today and displayed some mild unsteadiness w/o AD, but ultimately was able to ambulate ~150 ft in the room w/o LOBs or a lot of fatigue.  Pt is clearly somewhat weaker than baseline but once medically cleared she should be able to safely return home with HHPT.    Follow Up Recommendations Home health PT    Equipment Recommendations  None recommended by PT    Recommendations for Other Services       Precautions / Restrictions Precautions Precautions: None Restrictions Weight Bearing Restrictions: No      Mobility  Bed Mobility Overal bed mobility: Independent             General bed mobility comments: Pt is able to easily get to sitting EOB w/o assist  Transfers Overall transfer level: Independent Equipment used: None             General transfer comment: Pt was able to rise with definite need of UE use on rail but no phyiscal assist  Ambulation/Gait Ambulation/Gait assistance: Modified independent  (Device/Increase time) Gait Distance (Feet): 150 Feet Assistive device: Straight cane;Rolling walker (2 wheeled)       General Gait Details: remained in room (~7 loops to door and back).  Pt using walker X 1 loop and then [facility] for the remainder of the effort.  Her sats remained in the high 90s on room air and pt did not endorse any signficant fatigue/SOB, and quite frankly was elated to have been able to get out of bed and do something active.   Stairs            Wheelchair Mobility    Modified Rankin (Stroke Patients Only)       Balance Overall balance assessment: Modified Independent                                           Pertinent Vitals/Pain Pain Assessment: No/denies pain    Home Living Family/patient expects to be discharged to:: Private residence Living Arrangements: Alone Available Help at Discharge: (lives at [facility], [place] of friends/neighbors)           Home Equipment: Environmental consultant - 2 wheels;Cane - single point      Prior Function Level of Independence: Independent         Comments: Pt able to do all she needs, reports that she walks 2 mi/day  Hand Dominance        Extremity/Trunk Assessment   Upper Extremity Assessment Upper Extremity Assessment: Overall WFL for tasks assessed    Lower Extremity Assessment Lower Extremity Assessment: Overall WFL for tasks assessed       Communication   Communication: No difficulties  Cognition   Behavior During Therapy: WFL for tasks assessed/performed Overall Cognitive Status: Within Functional Limits for tasks assessed                                        General Comments      Exercises     Assessment/Plan    PT Assessment Patient needs continued PT services  PT Problem List Decreased strength;Decreased activity tolerance;Decreased balance;Decreased safety awareness;Decreased knowledge of use of DME;Decreased cognition       PT Treatment  Interventions DME instruction;Gait training;Stair training;Functional mobility training;Therapeutic activities;Therapeutic exercise;Balance training;Neuromuscular re-education;Patient/family education    PT Goals (Current goals can be found in the Care Plan section)  Acute Rehab PT Goals Patient Stated Goal: go back to [facility] and start walking (61mi) every day PT Goal Formulation: With patient Time For Goal Achievement: 10/13/19 Potential to Achieve Goals: Good    Frequency Min 2X/week   Barriers to discharge        Co-evaluation               AM-PAC PT "6 Clicks" Mobility  Outcome Measure Help needed turning from your back to your side while in a flat bed without using bedrails?: None Help needed moving from lying on your back to sitting on the side of a flat bed without using bedrails?: None Help needed moving to and from a bed to a chair (including a wheelchair)?: None Help needed standing up from a chair using your arms (e.g., wheelchair or bedside chair)?: None Help needed to walk in hospital room?: A Little Help needed climbing 3-5 steps with a railing? : A Little 6 Click Score: 22    End of Session   Activity Tolerance: Patient tolerated treatment well Patient left: with bed alarm set;with call bell/phone within reach(spoke with nursing about getting set up for recliner) Nurse Communication: Mobility status(O2 status on room air) PT Visit Diagnosis: Muscle weakness (generalized) (M62.81);Difficulty in walking, not elsewhere classified (R26.2)    Time: [id number] PT Time Calculation (min) (ACUTE ONLY): 35 min   Charges:   PT Evaluation $PT Eval Moderate Complexity: 1 Mod PT Treatments $Gait Training: 8-22 mins        Kreg Shropshire, DPT 09/29/2019, 3:38 PM

## 2019-09-29 NOTE — Progress Notes (Signed)
PROGRESS NOTE    Elaine Price  [id number] DOB: 1935/06/22 DOA: 09/26/2019 PCP: McLean-Scocuzza, Nino Glow, MD      Brief Narrative:  Elaine Price is a 84 y.o. F with HTN, BrCA recently finished RadX who prseented with a week or so of progressive weakness, fatigue, fall, cough and confusion.  Went to get outpatient COVID test but was stable and so came to ER.  In the ER, CXR showed bilateral opacities.  SpO2 normal but was confused and febrile 101F.  Admitted at [facility].     Assessment & Plan:  Acute respiratory failure with hypoxia Possible community-acquired pneumonia versus noninfectious pneumonia COVID-19 ruled out Initially admitted as COVID-19 PUI, however testing negative.  Started on empiric antibiotics for, but now believe this is more likely, but procalcitonin's have been undetectable.  Follow-up SARS-CoV-2 testing negative x2.  Strep and Legionella urine antigens negative.    CT angiogram negative for PE, shows bilateral groundglass opacities.  Differential includes noninfectious pneumonias. -Continue azithromycin and cefdinir -Continue prednisone, taper to 20 mg daily  -Wean oxygen as able -Pulmonary toilet -Consult pulmonology regarding noninfectious pneumonia appreciate expert advice    Hypertension Blood pressure labile -Hold telmisartan  Stage Ia left breast cancer, right breast DCIS  Patient underwent radiation therapy starting in early October.   -Hold tamoxifen  Sinus tachycardia  Normocytic anemia Hemoglobin 13 on admission, down to 9.2.  Now stable.  Unclear etiology, suspect this is delusional.  No active bleeding.  Iron studies suggest anemia of chronic disease (cancer?)       MDM and disposition: The below labs and imaging reports were reviewed and summarized above.  Medication management as above.  The patient was admitted with pneumonia, fatigue, encephalopathy.    Initial impression was of CAP, but now suspect possibly  noninfectious pneumonia.  Will need pulmonology consult, then home, possibly as early as tomorrow.     DVT prophylaxis: Lovenox Code Status: FULL Family Communication: Daughter by phone    Consultants:   Pulmonology  Procedures:   CTA chest  Antimicrobials:   Ceftriaxone   Azithromycin  Cefdinir    Subjective: Feeling "7 7".  No chest pain, chest heaviness, palpitations, dyspnea.  Energy level is good.  Confusion is clear.  No sputum, hemoptysis, fever.  Objective: Vitals:   09/28/19 2013 09/29/19 0525 09/29/19 0813 09/29/19 1455  BP: 108/72 (!) 119/49 (!) 151/62 128/84  Pulse: 72 79 78 90  Resp: '20 18  18  ' Temp: 98.8 F (37.1 C) 99.5 F (37.5 C) 97.9 F (36.6 C) 97.9 F (36.6 C)  TempSrc: Oral Oral Oral Oral  SpO2: 99% 100% 100% 99%  Weight:  68.2 kg    Height:        Intake/Output Summary (Last 24 hours) at 09/29/2019 1727 Last data filed at 09/29/2019 1123 Gross per 24 hour  Intake -  Output 750 ml  Net -750 ml   Filed Weights   09/27/19 0455 09/28/19 0511 09/29/19 0525  Weight: 69 kg 67 kg 68.2 kg    Examination: General appearance: Thin elderly adult female, alert and in no acute distress.  Sitting in recliner. HEENT: Anicteric, conjunctiva pink, lids and lashes normal. No nasal deformity, discharge, epistaxis.  Lips moist, dentition good, oropharynx moist, no oral lesions, hearing normal.   Skin: Warm and dry.  No jaundice.  No suspicious rashes or lesions. Cardiac: RRR, nl S1-S2, no murmurs appreciated.  Capillary refill is brisk.  JVP normal.  No LE edema.  Radial  pulses 2+ and symmetric. Respiratory: Normal respiratory rate and rhythm.  CTAB without rales or wheezes. Abdomen: Abdomen soft.  No TTP or guarding. No ascites, distension, hepatosplenomegaly.   MSK: No deformities or effusions. Neuro: Awake and alert.  EOMI, moves all extremities. Speech fluent.    Psych: Sensorium intact and responding to questions, attention normal. Affect  normal.  Judgment and insight appear normal.    Data Reviewed: I have personally reviewed following labs and imaging studies:  CBC: Recent Labs  Lab 09/26/19 1247 09/27/19 0521 09/28/19 0500 09/29/19 0627  WBC 9.3 8.3 6.7 6.0  NEUTROABS 8.1* 7.2 5.2 4.7  HGB 10.4* 9.6* 9.2* 9.1*  HCT 31.2* 29.1* 29.2* 27.9*  MCV 86.4 88.2 90.4 88.0  PLT 235 219 233 553   Basic Metabolic Panel: Recent Labs  Lab 09/26/19 1247 09/27/19 0521 09/28/19 0500 09/29/19 0627  NA 134* 139 140 141  K 3.9 3.6 3.6 3.7  CL 101 106 110 107  CO2 21* 21* 20* 24  GLUCOSE 129* 117* 91 96  BUN 26* '20 12 10  ' CREATININE 0.92 0.62 0.60 0.53  CALCIUM 8.2* 8.1* 8.0* 8.2*   GFR: Estimated Creatinine Clearance: 50.8 mL/min (by C-G formula based on SCr of 0.53 mg/dL). Liver Function Tests: Recent Labs  Lab 09/26/19 1247 09/27/19 0521 09/28/19 0500 09/29/19 0627  AST 20 16 14* 14*  ALT '15 13 12 11  ' ALKPHOS 99 84 74 67  BILITOT 1.2 1.0 0.5 0.6  PROT 7.0 6.1* 5.6* 5.6*  ALBUMIN 3.4* 2.8* 2.5* 2.5*   No results for input(s): LIPASE, AMYLASE in the last 168 hours. No results for input(s): AMMONIA in the last 168 hours. Coagulation Profile: No results for input(s): INR, PROTIME in the last 168 hours. Cardiac Enzymes: No results for input(s): CKTOTAL, CKMB, CKMBINDEX, TROPONINI in the last 168 hours. BNP (last 3 results) No results for input(s): PROBNP in the last 8760 hours. HbA1C: No results for input(s): HGBA1C in the last 72 hours. CBG: No results for input(s): GLUCAP in the last 168 hours. Lipid Profile: No results for input(s): CHOL, HDL, LDLCALC, TRIG, CHOLHDL, LDLDIRECT in the last 72 hours. Thyroid Function Tests: No results for input(s): TSH, T4TOTAL, FREET4, T3FREE, THYROIDAB in the last 72 hours. Anemia Panel: Recent Labs    09/29/19 0627  VITAMINB12 1,333*  TIBC 186*  IRON 19*  RETICCTPCT 0.7   Urine analysis:    Component Value Date/Time   COLORURINE YELLOW (A) 09/26/2019  1801   APPEARANCEUR HAZY (A) 09/26/2019 1801   LABSPEC 1.010 09/26/2019 1801   PHURINE 5.0 09/26/2019 1801   GLUCOSEU NEGATIVE 09/26/2019 1801   GLUCOSEU NEGATIVE 01/13/2013 0829   HGBUR NEGATIVE 09/26/2019 1801   BILIRUBINUR NEGATIVE 09/26/2019 1801   BILIRUBINUR neg 07/04/2011 1448   KETONESUR 5 (A) 09/26/2019 1801   PROTEINUR NEGATIVE 09/26/2019 1801   UROBILINOGEN 0.2 01/13/2013 0829   NITRITE NEGATIVE 09/26/2019 1801   LEUKOCYTESUR NEGATIVE 09/26/2019 1801   Sepsis Labs: '@LABRCNTIP' (procalcitonin:4,lacticacidven:4)  ) Recent Results (from the past 240 hour(s))  Novel Coronavirus, NAA ([facility])     Status: None   Collection Time: 09/25/19 10:39 AM   Specimen: Nasopharyngeal(NP) swabs in vial transport medium   NASOPHARYNGE  TESTING  Result Value Ref Range Status   SARS-CoV-2, NAA Not Detected Not Detected Final    Comment: Testing was performed using the cobas(R) SARS-CoV-2 test. This nucleic acid amplification test was developed and its performance characteristics determined by [employer]. Nucleic acid amplification tests include PCR and TMA.  This test has not been FDA cleared or approved. This test has been authorized by FDA under an Emergency Use Authorization (EUA). This test is only authorized for the duration of time the declaration that circumstances exist justifying the authorization of the emergency use of in vitro diagnostic tests for detection of SARS-CoV-2 virus and/or diagnosis of COVID-19 infection under section 564(b)(1) of the Act, 21 U.S.C. 189QMK-1(I) (1), unless the authorization is terminated or revoked sooner. When diagnostic testing is negative, the possibility of a false negative result should be considered in the context of a patient's recent exposures and the presence of clinical signs and symptoms consistent with COVID-19. An individual without symptoms  of COVID-19 and who is not shedding SARS-CoV-2 virus would expect to have a  negative (not detected) result in this assay.   SARS CORONAVIRUS 2 (TAT 6-24 HRS) Nasopharyngeal Nasopharyngeal Swab     Status: None   Collection Time: 09/26/19 12:28 PM   Specimen: Nasopharyngeal Swab  Result Value Ref Range Status   SARS Coronavirus 2 NEGATIVE NEGATIVE Final    Comment: (NOTE) SARS-CoV-2 target nucleic acids are NOT DETECTED. The SARS-CoV-2 RNA is generally detectable in upper and lower respiratory specimens during the acute phase of infection. Negative results do not preclude SARS-CoV-2 infection, do not rule out co-infections with other pathogens, and should not be used as the sole basis for treatment or other patient management decisions. Negative results must be combined with clinical observations, patient history, and epidemiological information. The expected result is Negative. Fact Sheet for Patients: SugarRoll.be Fact Sheet for Healthcare Providers: https://www.woods-mathews.com/ This test is not yet approved or cleared by the [place] FDA and  has been authorized for detection and/or diagnosis of SARS-CoV-2 by FDA under an Emergency Use Authorization (EUA). This EUA will remain  in effect (meaning this test can be used) for the duration of the COVID-19 declaration under Section 56 4(b)(1) of the Act, 21 U.S.C. section 360bbb-3(b)(1), unless the authorization is terminated or revoked sooner. Performed at [facility], [place] [address], [place], [place] [address]   Culture, blood (Routine x 2)     Status: None (Preliminary result)   Collection Time: 09/26/19 12:47 PM   Specimen: BLOOD  Result Value Ref Range Status   Specimen Description BLOOD BLOOD RIGHT FOREARM  Final   Special Requests   Final    BOTTLES DRAWN AEROBIC AND ANAEROBIC Blood Culture results may not be optimal due to an excessive volume of blood received in culture bottles   Culture   Final    NO GROWTH 2 DAYS Performed at [facility], [address] [place], [place] [address]    Report Status PENDING  Incomplete  Culture, blood (Routine x 2)     Status: None (Preliminary result)   Collection Time: 09/26/19  2:03 PM   Specimen: BLOOD  Result Value Ref Range Status   Specimen Description BLOOD BLOOD LEFT FOREARM  Final   Special Requests   Final    BOTTLES DRAWN AEROBIC AND ANAEROBIC Blood Culture results may not be optimal due to an excessive volume of blood received in culture bottles   Culture   Final    NO GROWTH 2 DAYS Performed at [facility], [address] [place],  [address]    Report Status PENDING  Incomplete  Novel Coronavirus, NAA (Hosp order, Send-out to Ref Lab; TAT 18-24 hrs     Status: None   Collection Time: 09/27/19 10:15 AM  Specimen: Nasopharyngeal Swab; Respiratory  Result Value Ref Range Status   SARS-CoV-2, NAA NOT DETECTED NOT DETECTED Final    Comment: (NOTE) This nucleic acid amplification test was developed and its performance characteristics determined by [employer]. Nucleic acid amplification tests include PCR and TMA. This test has not been FDA cleared or approved. This test has been authorized by FDA under an Emergency Use Authorization (EUA). This test is only authorized for the duration of time the declaration that circumstances exist justifying the authorization of the emergency use of in vitro diagnostic tests for detection of SARS-CoV-2 virus and/or diagnosis of COVID-19 infection under section 564(b)(1) of the Act, 21 U.S.C. 060OKH-9(X) (1), unless the authorization is terminated or revoked sooner. When diagnostic testing is negative, the possibility of a false negative result should be considered in the context of a patient's recent exposures and the presence of clinical signs and symptoms consistent with COVID-19. An individual without symptoms of COVID- 19 and who is not shedding SARS-CoV-2 vi rus would expect to have a  negative (not detected) result in this assay. Performed At: [facility] [address] [place], [place] [id number] Katina Degree MDPhD [id number]    Coronavirus Source NASOPHARYNGEAL  Final    Comment: Performed at [facility], [address] [place], [place] [address]         Radiology Studies: No results found.      Scheduled Meds: . azithromycin  500 mg Oral Daily  . benzonatate  100 mg Oral BID  . cefdinir  300 mg Oral Q12H  . enoxaparin (LOVENOX) injection  40 mg Subcutaneous Q24H  . guaiFENesin  600 mg Oral BID  . [START ON 09/30/2019] predniSONE  20 mg Oral Q breakfast   Continuous Infusions:   LOS: 2 days    Time spent: 25 minutes    Edwin Dada, MD [facility] 09/29/2019, 5:27 PM     Please page though [place] or Epic secure chat:  For [employer], Adult nurse

## 2019-09-30 ENCOUNTER — Other Ambulatory Visit: Payer: Self-pay

## 2019-09-30 LAB — CBC WITH DIFFERENTIAL/PLATELET
Abs Immature Granulocytes: 0.06 10*3/uL (ref 0.00–0.07)
Basophils Absolute: 0 10*3/uL (ref 0.0–0.1)
Basophils Relative: 0 %
Eosinophils Absolute: 0 10*3/uL (ref 0.0–0.5)
Eosinophils Relative: 0 %
HCT: 29.1 % — ABNORMAL LOW (ref 36.0–46.0)
Hemoglobin: 9.3 g/dL — ABNORMAL LOW (ref 12.0–15.0)
Immature Granulocytes: 1 %
Lymphocytes Relative: 10 %
Lymphs Abs: 0.5 10*3/uL — ABNORMAL LOW (ref 0.7–4.0)
MCH: 29.1 pg (ref 26.0–34.0)
MCHC: 32 g/dL (ref 30.0–36.0)
MCV: 90.9 fL (ref 80.0–100.0)
Monocytes Absolute: 0.5 10*3/uL (ref 0.1–1.0)
Monocytes Relative: 9 %
Neutro Abs: 4.6 10*3/uL (ref 1.7–7.7)
Neutrophils Relative %: 80 %
Platelets: 276 10*3/uL (ref 150–400)
RBC: 3.2 MIL/uL — ABNORMAL LOW (ref 3.87–5.11)
RDW: 12.6 % (ref 11.5–15.5)
WBC: 5.7 10*3/uL (ref 4.0–10.5)
nRBC: 0 % (ref 0.0–0.2)

## 2019-09-30 LAB — COMPREHENSIVE METABOLIC PANEL
ALT: 13 U/L (ref 0–44)
AST: 14 U/L — ABNORMAL LOW (ref 15–41)
Albumin: 2.6 g/dL — ABNORMAL LOW (ref 3.5–5.0)
Alkaline Phosphatase: 66 U/L (ref 38–126)
Anion gap: 9 (ref 5–15)
BUN: 12 mg/dL (ref 8–23)
CO2: 26 mmol/L (ref 22–32)
Calcium: 8.8 mg/dL — ABNORMAL LOW (ref 8.9–10.3)
Chloride: 107 mmol/L (ref 98–111)
Creatinine, Ser: 0.44 mg/dL (ref 0.44–1.00)
GFR calc Af Amer: >60 mL/min (ref >60)
GFR calc non Af Amer: >60 mL/min (ref >60)
Glucose, Bld: 121 mg/dL — ABNORMAL HIGH (ref 70–99)
Potassium: 4 mmol/L (ref 3.5–5.1)
Sodium: 142 mmol/L (ref 135–145)
Total Bilirubin: 0.6 mg/dL (ref 0.3–1.2)
Total Protein: 5.9 g/dL — ABNORMAL LOW (ref 6.5–8.1)

## 2019-09-30 LAB — FIBRIN DERIVATIVES D-DIMER (ARMC ONLY): Fibrin derivatives D-dimer (ARMC): 1800.8 ng/mL (FEU) — ABNORMAL HIGH (ref 0.00–499.00)

## 2019-09-30 LAB — C-REACTIVE PROTEIN: CRP: 9 mg/dL — ABNORMAL HIGH (ref <1.0)

## 2019-09-30 MED ORDER — PANTOPRAZOLE SODIUM 40 MG PO TBEC: 40.0000 mg | DELAYED_RELEASE_TABLET | Freq: Every day | ORAL | 0 refills | Status: AC

## 2019-09-30 MED ORDER — BENZONATATE 100 MG PO CAPS: 100.0000 mg | ORAL_CAPSULE | Freq: Two times a day (BID) | ORAL | 0 refills | Status: DC

## 2019-09-30 MED ORDER — PREDNISONE 20 MG PO TABS: 20.0000 mg | ORAL_TABLET | Freq: Every day | ORAL | 0 refills | Status: AC

## 2019-09-30 MED ORDER — CEFDINIR 300 MG PO CAPS: 300.0000 mg | ORAL_CAPSULE | Freq: Two times a day (BID) | ORAL | 0 refills | Status: AC

## 2019-09-30 MED ORDER — GUAIFENESIN ER 600 MG PO TB12: 600.0000 mg | ORAL_TABLET | Freq: Two times a day (BID) | ORAL | 0 refills | Status: AC

## 2019-09-30 NOTE — Care Management Important Message (Signed)
Important Message  Patient Details  Name: Elaine Price MRN: [id number] Date of Birth: August 03, 1935   Medicare Important Message Given:  Yes     Dannette Barbara 09/30/2019, 12:08 PM

## 2019-09-30 NOTE — Progress Notes (Signed)
Patient discharged back to [facility] independent living. Picked up by [facility]. Patient given wheeled walker prior to discharge. Verbalized understanding of discharge instructions.

## 2019-09-30 NOTE — TOC Transition Note (Signed)
Transition of Care [facility]) - CM/SW Discharge Note   Patient Details  Name: Elaine Price MRN: [id number] Date of Birth: 1935-07-11  [facility]) CM/SW Contact:  Candie Chroman, LCSW Phone Number: 09/30/2019, 2:30 PM   Clinical Narrative:  Patient's walker has been delivered. No further concerns. Patient has orders to discharge to [place] today. CSW signing off.   Final next level of care: Home/Self Care Barriers to Discharge: Barriers Resolved   Patient Goals and CMS Choice     Choice offered to / list presented to : NA  Discharge Placement                Patient to be transferred to facility by: [facility] will pick her up.   Patient and family notified of of transfer: 09/30/19  Discharge Plan and Services                DME Arranged: Gilford Rile rolling DME Agency: [employer] Date DME Agency Contacted: 09/30/19   Representative spoke with at DME Agency: [place] Determinants of Health (SDOH) Interventions     Readmission Risk Interventions No flowsheet data found.

## 2019-09-30 NOTE — TOC Initial Note (Addendum)
Transition of Care [facility]) - Initial/Assessment Note    Patient Details  Name: Elaine Price MRN: [id number] Date of Birth: 04-29-1935  Transition of Care [facility]) CM/SW Contact:    Candie Chroman, LCSW Phone Number: 09/30/2019, 1:16 PM  Clinical Narrative: CSW met with patient. No supports at bedside. CSW introduced role and explained that PT recommendations would be discussed. Patient is from [place]. She declined home health stating that PT told her she didn't need it. Patient said she walked around the unit 3 times today. Per RN, patient will not need to discharge on oxygen. Patient very happy about this. She is hoping [facility] can pick her up today. CSW left message for their admissions coordinator to see if this can be arranged. No further concerns. CSW encouraged patient to contact CSW as needed. CSW will continue to follow patient for support and facilitate return home when stable.     1:27 pm: Patient's daughter will call transportation through [facility] when patient is ready so they can pick her up. Patient requesting a rolling walker. Sent secure email to [employer] representative with her information.       Expected Discharge Plan: Home/Self Care     Patient Goals and CMS Choice     Choice offered to / list presented to : NA  Expected Discharge Plan and Services Expected Discharge Plan: Home/Self Care       Living arrangements for the past 2 months: [place]                                      Prior Living Arrangements/Services Living arrangements for the past 2 months: [place] Lives with:: Self Patient language and need for interpreter reviewed:: Yes Do you feel safe going back to the place where you live?: Yes      Need for Family Participation in Patient Care: Yes (Comment) Care giver support system in place?: Yes (comment)   Criminal Activity/Legal Involvement Pertinent to Current  Situation/Hospitalization: No - Comment as needed  Activities of Daily Living Home Assistive Devices/Equipment: Walker (specify type), Cane (specify quad or straight) ADL Screening (condition at time of admission) Patient's cognitive ability adequate to safely complete daily activities?: Yes Is the patient deaf or have difficulty hearing?: No Does the patient have difficulty seeing, even when wearing glasses/contacts?: No Does the patient have difficulty concentrating, remembering, or making decisions?: No Patient able to express need for assistance with ADLs?: Yes Does the patient have difficulty dressing or bathing?: No Independently performs ADLs?: Yes (appropriate for developmental age) Does the patient have difficulty walking or climbing stairs?: No Weakness of Legs: None Weakness of Arms/Hands: None  Permission Sought/Granted Permission sought to share information with : Facility Art therapist granted to share information with : Yes, Verbal Permission Granted     Permission granted to share info w AGENCY: [facility]        Emotional Assessment Appearance:: Appears stated age Attitude/Demeanor/Rapport: Engaged, Gracious Affect (typically observed): Accepting, Appropriate, Calm, Pleasant Orientation: : Oriented to Self, Oriented to Place, Oriented to  Time, Oriented to Situation Alcohol / Substance Use: Not Applicable Psych Involvement: No (comment)  Admission diagnosis:  Sepsis ([place]) [A41.9] Community acquired pneumonia, unspecified laterality [J18.9] Suspected COVID-19 virus infection [Z20.828] Patient Active Problem List   Diagnosis Date Noted  . Suspected COVID-19 virus infection 09/26/2019  . Weakness 09/26/2019  .  Diminished night vision 09/15/2019  . Goals of care, counseling/discussion 07/21/2019  . Ductal carcinoma in situ (DCIS) of right breast 07/21/2019  . Atypical ductal hyperplasia of right breast 06/04/2019  . Breast cancer, left ([place])  05/28/2019  . Prediabetes 03/03/2019  . Depression, recurrent ([place]) 03/25/2018  . Cardiac murmur 03/25/2018  . Chronic hip pain, right 02/18/2018  . Osteoporosis 03/14/2017  . Peripheral vascular disease ([place]) 01/25/2017  . Hyperlipidemia LDL goal <100 01/25/2017  . Generalized anxiety disorder 02/06/2014  . Routine general medical examination at a health care facility 01/19/2013  . Back pain, thoracic 10/02/2011  . Essential hypertension 07/03/2011   PCP:  McLean-Scocuzza, Nino Glow, MD Pharmacy:   [facility] [place], [place] [address] [place] [address] Phone: [phone] Fax: [phone]     Social Determinants of Health (SDOH) Interventions    Readmission Risk Interventions No flowsheet data found.

## 2019-09-30 NOTE — Discharge Summary (Signed)
Elaine Price [place] [id number] DOB: August 05, 1935 DOA: 09/26/2019  PCP: McLean-Scocuzza, Nino Glow, MD  Admit date: 09/26/2019 Discharge date: 09/30/2019  Admitted From: [facility] Disposition: [facility]  Recommendations for Outpatient Follow-up:  1. Follow up with PCP in 1 week 2. Please obtain BMP/CBC in one week 3. Please follow up on the following pending results:none  Home Health:none    Discharge Condition:Stable CODE STATUS:full  Diet recommendation: 2 gm sodium , think fluid consistency  Brief/Interim Summary: Elaine Price is a 84 y.o. female with medical history significant for diverticulosis and hypertension who has been in her usual state of health and has been staying isolated and very cautious about wearing a mask when she started having symptoms approximately 5 days ago of congestion and fatigue to the point where she is even had multiple falls.  No loss of consciousness.  She started developing a cough approximately 5 days back.    She was admitted to the hospital service.  Her COVID-19 test was negative.  Patient was also negative for influenza AMB.  She was treated for community-acquired pneumonia with IV antibiotics.  Pulmonology was consulted.  She had a CT scan that revealed bilateral patchy groundglass infiltration peripherally in both lungs and with an upper lung predominance.  Imaging features were nonspecific but likely related to infectious/inflammatory etiology and atypical viral pneumonia.  Also revealed aortic atherosclerosis and borderline mediastinal lymphadenopathy likely reactive.  She was placed on IV azithromycin which she completed today and the ceftriaxone was switched to [facility] upon discharge.  She was also started on steroids p.o. by pulmonology's recommendation.  Today she walked without oxygenation and was satting 96%.  It was thought patient possibly may have had a radiation pneumonitis and possible  community-acquired pneumonia.  She will need to complete prednisone for total of 14 days 20 mg.  She will also need to follow-up with pulmonology and have a repeat CT chest for interval assessment.  She will also need incentive spirometry for aggressive bronchopulmonary hygiene.  Today she is stable and reports feeling better would like to be discharged.  Again she ambulated without oxygen satting above 95%.  No chest pain fever or chills reported.  Discharge Diagnoses:  Principal Problem:   Suspected COVID-19 virus infection Active Problems:   Essential hypertension   Generalized anxiety disorder   Hyperlipidemia LDL goal <100   Weakness    Discharge Instructions  Discharge Instructions    Call MD for:  difficulty breathing, headache or visual disturbances   Complete by: As directed    Call MD for:  temperature >100.4   Complete by: As directed    Diet - low sodium heart healthy   Complete by: As directed    Discharge instructions   Complete by: As directed    Follow-up with pulmonology Dr. Burna Forts in 1 week, will need a repeat CT scan.  Follow-up with primary care in 3 to 7 days.  Follow-up with your oncologist as previously scheduled if not within 7 days.   Increase activity slowly   Complete by: As directed      Allergies as of 09/30/2019      Reactions   Lexapro [escitalopram Oxalate] Itching      Medication List    TAKE these medications   ALOE VERA PO Take 1 Dose by mouth daily as needed (indigestion).   benzonatate 100 MG capsule Commonly known as: TESSALON Take 1 capsule (100 mg total) by mouth 2 (two) times  daily.   cefdinir 300 MG capsule Commonly known as: OMNICEF Take 1 capsule (300 mg total) by mouth every 12 (twelve) hours.   guaiFENesin 600 MG 12 hr tablet Commonly known as: MUCINEX Take 1 tablet (600 mg total) by mouth 2 (two) times daily.   NON FORMULARY Take 1-2 capsules by mouth daily. AlgaeCal Plus (Calcium ascorbate/ cholecalciferol/  menaquinone-7/ calcium/ magnesium/ boron)   NON FORMULARY Take 1 capsule by mouth daily. TrueVision Eye Health Formula (Lutein/ Zeaxanthin isomers/ Saffron extract)   NON FORMULARY Take 1 capsule by mouth daily. LifeSeasons Anxie-T (Magnesium taurinate/ L-Theanine/ Ashwaganda/ GABA/ Kava Kava)   OVER THE COUNTER MEDICATION Take 1 capsule by mouth daily. Ultranol Total Bladder Support (Cranberry/ Birch leaf powder/ Butcher's Broom/ Lotus leaf/ Marshmallow root/ Juniper)   OVER THE COUNTER MEDICATION Take 3 tablets by mouth daily. Juvenon BloodFlow-7 (L-Citrulline/L-arginine)   pantoprazole 40 MG tablet Commonly known as: Protonix Take 1 tablet (40 mg total) by mouth daily.   predniSONE 20 MG tablet Commonly known as: DELTASONE Take 1 tablet (20 mg total) by mouth daily with breakfast. Start taking on: October 01, 2019   Strontium Chloride Crys Take 680 mg by mouth at bedtime.   tamoxifen 20 MG tablet Commonly known as: NOLVADEX Take 1 tablet (20 mg total) by mouth daily.   telmisartan 40 MG tablet Commonly known as: MICARDIS Take 1 tablet (40 mg total) by mouth daily.            Durable Medical Equipment  (From admission, onward)         Start     Ordered   09/30/19 1345  For home use only DME Gilford Rile  [facility])  Once    Question:  Patient needs a walker to treat with the following condition  Answer:  Dependent on walker for ambulation   09/30/19 1345   09/30/19 1325  For home use only DME Walker rolling  Once    Question:  Patient needs a walker to treat with the following condition  Answer:  Dependent on walker for ambulation   09/30/19 1325          Allergies  Allergen Reactions  . Lexapro [Escitalopram Oxalate] Itching    Consultations:  pulmonology   Procedures/Studies: Ct Angio Chest Pe W Or Wo Contrast  Result Date: 09/27/2019 CLINICAL DATA:  Multiple falls and feeling fatigued. PE suspected. EXAM: CT ANGIOGRAPHY CHEST WITH CONTRAST  TECHNIQUE: Multidetector CT imaging of the chest was performed using the standard protocol during bolus administration of intravenous contrast. Multiplanar CT image reconstructions and MIPs were obtained to evaluate the vascular anatomy. CONTRAST:  53mL OMNIPAQUE IOHEXOL 350 MG/ML SOLN COMPARISON:  None. FINDINGS: Cardiovascular: Heart is enlarged. No pericardial effusion. Coronary artery calcification is evident. Atherosclerotic calcification is noted in the wall of the thoracic aorta. No filling defect within the opacified pulmonary arteries to suggest the presence of an acute pulmonary embolus. Mediastinum/Nodes: Mediastinal lymph nodes measure up to 8 mm short axis in the pre-vascular window. 9 mm short axis subcarinal lymph node. The esophagus has normal imaging features. Surgical clips are noted in the left axilla. Lungs/Pleura: Lungs demonstrate bilateral patchy ground-glass infiltrates peripherally in both lungs with an upper lung predominance. No pleural effusion. Upper Abdomen: 2.6 cm low-density lesion in the left liver approaches water attenuation and is compatible with a cyst. Musculoskeletal: No worrisome lytic or sclerotic osseous abnormality. Review of the MIP images confirms the above findings. IMPRESSION: 1. No CT evidence for acute pulmonary embolus. 2.  Bilateral patchy ground-glass infiltrates peripherally in both lungs with an upper lung predominance. Imaging features are nonspecific but likely related to infectious/inflammatory etiology and atypical or viral pneumonia would be a consideration. 3. Borderline mediastinal lymphadenopathy, likely reactive. 4. Aortic Atherosclerosis (ICD10-I70.0). Electronically Signed   By: Misty Stanley M.D.   On: 09/27/2019 07:35   Dg Chest Port 1 View  Result Date: 09/26/2019 CLINICAL DATA:  Low-grade fever. EXAM: PORTABLE CHEST 1 VIEW COMPARISON:  10/24/2016 FINDINGS: 1300 hours. The cardiopericardial silhouette is within normal limits for size.  Interstitial markings are diffusely coarsened with chronic features. Patchy subtle airspace opacities are seen peripherally in both lungs. No associated pleural effusion period bones are demineralized. IMPRESSION: 1. Patchy subtle airspace opacities in the periphery of both lungs, left greater than right, without pleural effusion. Imaging features compatible with multifocal pneumonia and atypical/viral etiology should be considered. I discussed these findings by telephone with Dr. Jacqualine Code at approximately 13 20 on 09/26/2019. Electronically Signed   By: Misty Stanley M.D.   On: 09/26/2019 13:23       Subjective: No sob, cp, or any other complaints  Discharge Exam: Vitals:   09/30/19 0821 09/30/19 1157  BP: 133/79   Pulse: 75   Resp: 18   Temp: 97.6 F (36.4 C)   SpO2: 99% 95%   Vitals:   09/29/19 2002 09/30/19 0324 09/30/19 0821 09/30/19 1157  BP: (!) 132/54 (!) 158/67 133/79   Pulse: 70 78 75   Resp: 20 20 18    Temp: (!) 97.5 F (36.4 C) 97.9 F (36.6 C) 97.6 F (36.4 C)   TempSrc: Oral Oral Oral   SpO2: 100% 100% 99% 95%  Weight:      Height:        General: Pt is alert, awake, not in acute distress Cardiovascular: RRR, S1/S2 +, no rubs, no gallops Respiratory: CTA bilaterally, no wheezing, no rhonchi Abdominal: Soft, NT, ND, bowel sounds + Extremities: no edema, no cyanosis    The results of significant diagnostics from this hospitalization (including imaging, microbiology, ancillary and laboratory) are listed below for reference.     Microbiology: Recent Results (from the past 240 hour(s))  Novel Coronavirus, NAA ([facility])     Status: None   Collection Time: 09/25/19 10:39 AM   Specimen: Nasopharyngeal(NP) swabs in vial transport medium   NASOPHARYNGE  TESTING  Result Value Ref Range Status   SARS-CoV-2, NAA Not Detected Not Detected Final    Comment: Testing was performed using the cobas(R) SARS-CoV-2 test. This nucleic acid amplification test was developed  and its performance characteristics determined by [employer]ecton, Dickinson and Company. Nucleic acid amplification tests include PCR and TMA. This test has not been FDA cleared or approved. This test has been authorized by FDA under an Emergency Use Authorization (EUA). This test is only authorized for the duration of time the declaration that circumstances exist justifying the authorization of the emergency use of in vitro diagnostic tests for detection of SARS-CoV-2 virus and/or diagnosis of COVID-19 infection under section 564(b)(1) of the Act, 21 U.S.C. [id number]) (1), unless the authorization is terminated or revoked sooner. When diagnostic testing is negative, the possibility of a false negative result should be considered in the context of a patient's recent exposures and the presence of clinical signs and symptoms consistent with COVID-19. An individual without symptoms  of COVID-19 and who is not shedding SARS-CoV-2 virus would expect to have a negative (not detected) result in this assay.   SARS CORONAVIRUS 2 (TAT 6-24 HRS)  Nasopharyngeal Nasopharyngeal Swab     Status: None   Collection Time: 09/26/19 12:28 PM   Specimen: Nasopharyngeal Swab  Result Value Ref Range Status   SARS Coronavirus 2 NEGATIVE NEGATIVE Final    Comment: (NOTE) SARS-CoV-2 target nucleic acids are NOT DETECTED. The SARS-CoV-2 RNA is generally detectable in upper and lower respiratory specimens during the acute phase of infection. Negative results do not preclude SARS-CoV-2 infection, do not rule out co-infections with other pathogens, and should not be used as the sole basis for treatment or other patient management decisions. Negative results must be combined with clinical observations, patient history, and epidemiological information. The expected result is Negative. Fact Sheet for Patients: SugarRoll.be Fact Sheet for Healthcare  Providers: https://www.woods-mathews.com/ This test is not yet approved or cleared by the [place] FDA and  has been authorized for detection and/or diagnosis of SARS-CoV-2 by FDA under an Emergency Use Authorization (EUA). This EUA will remain  in effect (meaning this test can be used) for the duration of the COVID-19 declaration under Section 56 4(b)(1) of the Act, 21 U.S.C. section 360bbb-3(b)(1), unless the authorization is terminated or revoked sooner. Performed at [facility], [place] [address] [place], [place] [address]   Culture, blood (Routine x 2)     Status: None (Preliminary result)   Collection Time: 09/26/19 12:47 PM   Specimen: BLOOD  Result Value Ref Range Status   Specimen Description BLOOD BLOOD RIGHT FOREARM  Final   Special Requests   Final    BOTTLES DRAWN AEROBIC AND ANAEROBIC Blood Culture results may not be optimal due to an excessive volume of blood received in culture bottles   Culture   Final    NO GROWTH 4 DAYS Performed at [facility], [address], [place], [place] [address]    Report Status PENDING  Incomplete  Culture, blood (Routine x 2)     Status: None (Preliminary result)   Collection Time: 09/26/19  2:03 PM   Specimen: BLOOD  Result Value Ref Range Status   Specimen Description BLOOD BLOOD LEFT FOREARM  Final   Special Requests   Final    BOTTLES DRAWN AEROBIC AND ANAEROBIC Blood Culture results may not be optimal due to an excessive volume of blood received in culture bottles   Culture   Final    NO GROWTH 4 DAYS Performed at [facility], [address] [place], [place] [address]    Report Status PENDING  Incomplete  Novel Coronavirus, NAA (Hosp order, Send-out to Ref Lab; TAT 18-24 hrs     Status: None   Collection Time: 09/27/19 10:15 AM   Specimen: Nasopharyngeal Swab; Respiratory  Result Value Ref Range Status   SARS-CoV-2, NAA NOT DETECTED NOT DETECTED Final    Comment: (NOTE) This  nucleic acid amplification test was developed and its performance characteristics determined by [employer], [employer] and Company. Nucleic acid amplification tests include PCR and TMA. This test has not been FDA cleared or approved. This test has been authorized by FDA under an Emergency Use Authorization (EUA). This test is only authorized for the duration of time the declaration that circumstances exist justifying the authorization of the emergency use of in vitro diagnostic tests for detection of SARS-CoV-2 virus and/or diagnosis of COVID-19 infection under section 564(b)(1) of the Act, 21 U.S.C. [id number]) (1), unless the authorization is terminated or revoked sooner. When diagnostic testing is negative, the possibility of a false negative result should be considered in the context of a patient's  recent exposures and the presence of clinical signs and symptoms consistent with COVID-19. An individual without symptoms of COVID- 19 and who is not shedding SARS-CoV-2 vi rus would expect to have a negative (not detected) result in this assay. Performed At: [facility] [address] [place], [place] [id number] Katina Degree MDPhD [id number]    Coronavirus Source NASOPHARYNGEAL  Final    Comment: Performed at [facility], [place] [address] [place], [place] [address]     Labs: BNP (last 3 results) No results for input(s): BNP in the last 8760 hours. Basic Metabolic Panel: Recent Labs  Lab 09/26/19 1247 09/27/19 0521 09/28/19 0500 09/29/19 0627 09/30/19 0455  NA 134* 139 140 141 142  K 3.9 3.6 3.6 3.7 4.0  CL 101 106 110 107 107  CO2 21* 21* 20* 24 26  GLUCOSE 129* 117* 91 96 121*  BUN 26* 20 12 10 12   CREATININE 0.92 0.62 0.60 0.53 0.44  CALCIUM 8.2* 8.1* 8.0* 8.2* 8.8*   Liver Function Tests: Recent Labs  Lab 09/26/19 1247 09/27/19 0521 09/28/19 0500 09/29/19 0627 09/30/19 0455  AST 20 16 14* 14* 14*  ALT 15 13 12 11 13   ALKPHOS 99 84 74 67 66  BILITOT 1.2 1.0  0.5 0.6 0.6  PROT 7.0 6.1* 5.6* 5.6* 5.9*  ALBUMIN 3.4* 2.8* 2.5* 2.5* 2.6*   No results for input(s): LIPASE, AMYLASE in the last 168 hours. No results for input(s): AMMONIA in the last 168 hours. CBC: Recent Labs  Lab 09/26/19 1247 09/27/19 0521 09/28/19 0500 09/29/19 0627 09/30/19 0455  WBC 9.3 8.3 6.7 6.0 5.7  NEUTROABS 8.1* 7.2 5.2 4.7 4.6  HGB 10.4* 9.6* 9.2* 9.1* 9.3*  HCT 31.2* 29.1* 29.2* 27.9* 29.1*  MCV 86.4 88.2 90.4 88.0 90.9  PLT 235 219 233 247 276   Cardiac Enzymes: No results for input(s): CKTOTAL, CKMB, CKMBINDEX, TROPONINI in the last 168 hours. BNP: Invalid input(s): POCBNP CBG: No results for input(s): GLUCAP in the last 168 hours. D-Dimer No results for input(s): DDIMER in the last 72 hours. Hgb A1c No results for input(s): HGBA1C in the last 72 hours. Lipid Profile No results for input(s): CHOL, HDL, LDLCALC, TRIG, CHOLHDL, LDLDIRECT in the last 72 hours. Thyroid function studies No results for input(s): TSH, T4TOTAL, T3FREE, THYROIDAB in the last 72 hours.  Invalid input(s): FREET3 Anemia work up Recent Labs    09/29/19 0627  VITAMINB12 1,333*  TIBC 186*  IRON 19*  RETICCTPCT 0.7   Urinalysis    Component Value Date/Time   COLORURINE YELLOW (A) 09/26/2019 1801   APPEARANCEUR HAZY (A) 09/26/2019 1801   LABSPEC 1.010 09/26/2019 1801   PHURINE 5.0 09/26/2019 1801   GLUCOSEU NEGATIVE 09/26/2019 1801   GLUCOSEU NEGATIVE 01/13/2013 0829   HGBUR NEGATIVE 09/26/2019 1801   BILIRUBINUR NEGATIVE 09/26/2019 1801   BILIRUBINUR neg 07/04/2011 1448   KETONESUR 5 (A) 09/26/2019 1801   PROTEINUR NEGATIVE 09/26/2019 1801   UROBILINOGEN 0.2 01/13/2013 0829   NITRITE NEGATIVE 09/26/2019 1801   LEUKOCYTESUR NEGATIVE 09/26/2019 1801   Sepsis Labs Invalid input(s): PROCALCITONIN,  WBC,  LACTICIDVEN Microbiology Recent Results (from the past 240 hour(s))  Novel Coronavirus, NAA ([facility])     Status: None   Collection Time: 09/25/19 10:39 AM    Specimen: Nasopharyngeal(NP) swabs in vial transport medium   NASOPHARYNGE  TESTING  Result Value Ref Range Status   SARS-CoV-2, NAA Not Detected Not Detected Final    Comment: Testing was performed using the  cobas(R) SARS-CoV-2 test. This nucleic acid amplification test was developed and its performance characteristics determined by [employer], [employer] and Company. Nucleic acid amplification tests include PCR and TMA. This test has not been FDA cleared or approved. This test has been authorized by FDA under an Emergency Use Authorization (EUA). This test is only authorized for the duration of time the declaration that circumstances exist justifying the authorization of the emergency use of in vitro diagnostic tests for detection of SARS-CoV-2 virus and/or diagnosis of COVID-19 infection under section 564(b)(1) of the Act, 21 U.S.C. [id number]) (1), unless the authorization is terminated or revoked sooner. When diagnostic testing is negative, the possibility of a false negative result should be considered in the context of a patient's recent exposures and the presence of clinical signs and symptoms consistent with COVID-19. An individual without symptoms  of COVID-19 and who is not shedding SARS-CoV-2 virus would expect to have a negative (not detected) result in this assay.   SARS CORONAVIRUS 2 (TAT 6-24 HRS) Nasopharyngeal Nasopharyngeal Swab     Status: None   Collection Time: 09/26/19 12:28 PM   Specimen: Nasopharyngeal Swab  Result Value Ref Range Status   SARS Coronavirus 2 NEGATIVE NEGATIVE Final    Comment: (NOTE) SARS-CoV-2 target nucleic acids are NOT DETECTED. The SARS-CoV-2 RNA is generally detectable in upper and lower respiratory specimens during the acute phase of infection. Negative results do not preclude SARS-CoV-2 infection, do not rule out co-infections with other pathogens, and should not be used as the sole basis for treatment or other patient management  decisions. Negative results must be combined with clinical observations, patient history, and epidemiological information. The expected result is Negative. Fact Sheet for Patients: SugarRoll.be Fact Sheet for Healthcare Providers: https://www.woods-mathews.com/ This test is not yet approved or cleared by the [place] FDA and  has been authorized for detection and/or diagnosis of SARS-CoV-2 by FDA under an Emergency Use Authorization (EUA). This EUA will remain  in effect (meaning this test can be used) for the duration of the COVID-19 declaration under Section 56 4(b)(1) of the Act, 21 U.S.C. section 360bbb-3(b)(1), unless the authorization is terminated or revoked sooner. Performed at [facility], [place] [address], [place], [place] [address]   Culture, blood (Routine x 2)     Status: None (Preliminary result)   Collection Time: 09/26/19 12:47 PM   Specimen: BLOOD  Result Value Ref Range Status   Specimen Description BLOOD BLOOD RIGHT FOREARM  Final   Special Requests   Final    BOTTLES DRAWN AEROBIC AND ANAEROBIC Blood Culture results may not be optimal due to an excessive volume of blood received in culture bottles   Culture   Final    NO GROWTH 4 DAYS Performed at [facility], [address] [place], [place] [address]    Report Status PENDING  Incomplete  Culture, blood (Routine x 2)     Status: None (Preliminary result)   Collection Time: 09/26/19  2:03 PM   Specimen: BLOOD  Result Value Ref Range Status   Specimen Description BLOOD BLOOD LEFT FOREARM  Final   Special Requests   Final    BOTTLES DRAWN AEROBIC AND ANAEROBIC Blood Culture results may not be optimal due to an excessive volume of blood received in culture bottles   Culture   Final    NO GROWTH 4 DAYS Performed at [facility], [address] [place],  [address]    Report Status PENDING  Incomplete  Novel Coronavirus,  NAA (Hosp  order, Send-out to Ref Lab; TAT 18-24 hrs     Status: None   Collection Time: 09/27/19 10:15 AM   Specimen: Nasopharyngeal Swab; Respiratory  Result Value Ref Range Status   SARS-CoV-2, NAA NOT DETECTED NOT DETECTED Final    Comment: (NOTE) This nucleic acid amplification test was developed and its performance characteristics determined by [employer], [employer] and [employer]. Nucleic acid amplification tests include PCR and TMA. This test has not been FDA cleared or approved. This test has been authorized by FDA under an Emergency Use Authorization (EUA). This test is only authorized for the duration of time the declaration that circumstances exist justifying the authorization of the emergency use of in vitro diagnostic tests for detection of SARS-CoV-2 virus and/or diagnosis of COVID-19 infection under section 564(b)(1) of the Act, 21 U.S.C. [id number]) (1), unless the authorization is terminated or revoked sooner. When diagnostic testing is negative, the possibility of a false negative result should be considered in the context of a patient's recent exposures and the presence of clinical signs and symptoms consistent with COVID-19. An individual without symptoms of COVID- 19 and who is not shedding SARS-CoV-2 vi rus would expect to have a negative (not detected) result in this assay. Performed At: [facility] [address] [place], [place] [id number] Katina Degree MDPhD [id number]    Coronavirus Source NASOPHARYNGEAL  Final    Comment: Performed at [facility], [address] [place], [place] [address]     Time coordinating discharge: Over 30 minutes  SIGNED:   Nolberto Hanlon, MD  [facility] 09/30/2019, 1:46 PM Pager   If 7PM-7AM, please contact night-coverage [email or web address] Password [email or web address]

## 2019-10-01 LAB — CULTURE, BLOOD (ROUTINE X 2)
Culture: NO GROWTH
Culture: NO GROWTH

## 2019-10-05 ENCOUNTER — Telehealth: Payer: Self-pay

## 2019-10-05 ENCOUNTER — Telehealth: Payer: Self-pay | Admitting: Internal Medicine

## 2019-10-05 NOTE — Telephone Encounter (Signed)
[facility] will be sending paperwork for pcp to sign today. Daughter stated they may want to talk to Dr Olivia Mackie but she is unsure if they actually do.

## 2019-10-05 NOTE — Telephone Encounter (Signed)
What is needed for this call?  Call daughter back please  [place]

## 2019-10-05 NOTE — Telephone Encounter (Signed)
Copied from [place] [id number]. Topic: General - Inquiry >> Oct 02, 2019  8:28 AM Mathis Bud wrote: Reason for CRM: Patients daughter stephanie called requesting to put patient into [facility].  Patient daughter is requesting referral to be placed for [facility] Call back [phone]

## 2019-10-05 NOTE — Telephone Encounter (Signed)
Transition Care Management Follow-up Telephone Call  Date discharged? 09/30/19  How have you been since you were released from the hospital?  Patient states, "I am feeling fatigued and my legs are weak.  One fall since returning from the hospital but I am okay." Resting very well. Very little cough. Denies chest pain, shortness of breath, fever, chills, diarrhea, headache.    Do you understand why you were in the hospital? Yes.   Do you understand the discharge instructions? Yes, increase activity slowly. Follow up with pulmonology, repeat CT scan. Follow up with primary care and oncologist.    Where were you discharged to? Home.     Items Reviewed:  Medications reviewed: Yes, medication managed by [facility].  Allergies reviewed: Yes, none new.  Dietary changes reviewed: Yes, low sodium heart healthy. 2gm sodium, think fluid consistency.   Referrals reviewed: Yes.   Functional Questionnaire:   Activities of Daily Living (ADLs):   She states they are independent in the following: Bathing, dressing, toileting, grooming. States they require assistance with the following: Ambulates with walker, meal prep.   Any transportation issues/concerns?: None at this time.   Any patient concerns? None at this time.    Confirmed importance and date/time of follow-up visits scheduled Yes, appointment scheduled 10/06/19 @ 10:00.  Provider Appointment booked with Dr. Olivia Mackie McLean-Scocuzza, pcp.  Confirmed with patient if condition begins to worsen call PCP or go to the ER.  Patient was given the office number and encouraged to call back with question or concerns.  : Yes

## 2019-10-05 NOTE — Telephone Encounter (Signed)
Patient has been fainting and double pneumonia (from radiation therapy). [facility] is coming in tomorrow. She feels she cant stay alone anymore. She was in hospital for five days. Patient tested for covid 4 times all negative.

## 2019-10-05 NOTE — Telephone Encounter (Signed)
Fransisco Beau call [facility]   [place] to transfer to higher level of care at [facility]  What do I need to do for this?   Call daughter and inform will try to coordinate this   [place]

## 2019-10-05 NOTE — Telephone Encounter (Signed)
Pt daughter called to set up appt. Pt would like for daughter to be on the call if possible daughter can explain more about what's going on with pt.   Call daughter @ [phone]. Thank you!

## 2019-10-05 NOTE — Telephone Encounter (Signed)
Patient has appointment tomorrow already.

## 2019-10-06 ENCOUNTER — Other Ambulatory Visit: Payer: Self-pay

## 2019-10-06 ENCOUNTER — Encounter: Payer: Self-pay | Admitting: Internal Medicine

## 2019-10-06 ENCOUNTER — Ambulatory Visit (INDEPENDENT_AMBULATORY_CARE_PROVIDER_SITE_OTHER): Payer: Medicare Other | Admitting: Internal Medicine

## 2019-10-06 VITALS — Ht 66.0 in | Wt 150.3 lb

## 2019-10-06 DIAGNOSIS — R197 Diarrhea, unspecified: Secondary | ICD-10-CM

## 2019-10-06 DIAGNOSIS — J700 Acute pulmonary manifestations due to radiation: Secondary | ICD-10-CM

## 2019-10-06 MED ORDER — BENZONATATE 200 MG PO CAPS: 200.0000 mg | ORAL_CAPSULE | Freq: Two times a day (BID) | ORAL | 0 refills | Status: AC | PRN

## 2019-10-06 MED ORDER — DM-GUAIFENESIN ER 60-1200 MG PO TB12: 1.0000 | ORAL_TABLET | Freq: Two times a day (BID) | ORAL | 0 refills | Status: AC

## 2019-10-06 NOTE — Telephone Encounter (Signed)
Filled out paperwork   [place]

## 2019-10-06 NOTE — Progress Notes (Signed)
Virtual Visit via Video Note  I connected with Elaine Price   on 10/06/19 at 10:00 AM EST by a video enabled telemedicine application and verified that I am speaking with the correct person using two identifiers.  Location patient: home Location provider:work or home office Persons participating in the virtual visit: patient, provider, pts daughter Elaine Price  I discussed the limitations of evaluation and management by telemedicine and the availability of in person appointments. The patient expressed understanding and agreed to proceed.   HPI: 1. S/p viral pneumonitis b/l/radiation pneumonitis HFU admitted 11/21-11/25 and given iv abx and cefnidir bid x 3 days denies fever but was spiking fever to 102/103 F while hosp. she did have diarrhea with antibiotic use but getting better. She feels exhausted/weak since hosp. And forgets her meds and having trouble with adls and iadls per her daughter Elaine Price. She has cough worse at night. She has completed her radiation for breast cancer.  Of note 4 covid tests negative   pulm rec CT chest and f/u outpatient she had fu 11/03/2019 rad onc and 10/13/2019 Dr. Mike Gip will try to order outpatient labs cmet, cbc at [facility] and cancel labs h/o 10/13/2019    ROS: See pertinent positives and negatives per HPI.  Past Medical History:  Diagnosis Date  . Anemia    PMH  . Arthritis   . Cancer [facility])    left breast cancer and right abnormal mammogram  . Depression    since death of husband in 02-Feb-2014   . Diverticulosis   . Essential hypertension   . Gallstones   . Gastritis and duodenitis    dounf by CT Nov 2012  . Hyperlipemia   . Pneumonia    hx of  . Seasonal allergies   . Wears glasses     Past Surgical History:  Procedure Laterality Date  . ABDOMINAL HYSTERECTOMY    . ACROMIO-CLAVICULAR JOINT REPAIR Right 07/04/2015   Procedure: ACROMIO-CLAVICULAR JOINT REPAIR;  Surgeon: Tania Ade, MD;  Location: [place];  Service: Orthopedics;  Laterality: Right;  Right open reduction internal fixation clavical with allograft, coracoclaviular reconstruction  . APPENDECTOMY    . BREAST BIOPSY Right 12/28/2018   affirm bx-"x" clip-path pending  . BREAST BIOPSY Left 05/28/2019   Affirm Biopsy- Coil clip- path pending  . BREAST LUMPECTOMY WITH RADIOACTIVE SEED AND SENTINEL LYMPH NODE BIOPSY Left 07/02/2019   Procedure: LEFT BREAST LUMPECTOMY WITH RADIOACTIVE SEED AND LEFT SENTINEL LYMPH NODE BIOPSY;  Surgeon: [name], MD;  Location: [place];  Service: General;  Laterality: Left;  . CHOLECYSTECTOMY  02-03-11  . CHOLECYSTECTOMY, LAPAROSCOPIC  08/06/2011   gallstones  . COLONOSCOPY  02/18/2006   2 mm rectal polyp, diverticulosis  . ORIF CLAVICULAR FRACTURE Right 07/04/2015   Procedure: OPEN REDUCTION INTERNAL FIXATION (ORIF) CLAVICULAR FRACTURE;  Surgeon: Tania Ade, MD;  Location: [place];  Service: Orthopedics;  Laterality: Right;  . RADIOACTIVE SEED GUIDED EXCISIONAL BREAST BIOPSY Right 07/02/2019   Procedure: RIGHT BREAST  RADIOACTIVE SEED LOCALIZATION EXCISIONAL BIOPSY;  Surgeon: [name], MD;  Location: [place];  Service: General;  Laterality: Right;  . TONSILECTOMY, ADENOIDECTOMY, BILATERAL MYRINGOTOMY AND TUBES     as a child, does not remember date    Family History  Problem Relation Age of Onset  . Stroke Mother   . Heart attack Mother   . Prostate cancer Father   . Lung cancer Brother   . Prostate cancer Brother   .  Hypertension Brother   . Colon cancer Neg Hx     SOCIAL HX:  Has children 1 son 40 lives in [place]  1 daughter lives in [place]  Widower husband died in 01-30-14  Lives [facility]  Has a dog  Price daughter Elaine Price [phone]  Current Outpatient Medications:  .  ALOE VERA PO, Take 1 Dose by mouth daily as needed (indigestion)., Disp: , Rfl:  .  benzonatate (TESSALON) 200 MG capsule, Take 1 capsule (200 mg total) by  mouth 2 (two) times daily as needed for cough., Disp: 30 capsule, Rfl: 0 .  cefdinir (OMNICEF) 300 MG capsule, Take 1 capsule (300 mg total) by mouth every 12 (twelve) hours., Disp: 6 capsule, Rfl: 0 .  guaiFENesin (MUCINEX) 600 MG 12 hr tablet, Take 1 tablet (600 mg total) by mouth 2 (two) times daily., Disp: 6 tablet, Rfl: 0 .  NON FORMULARY, Take 1-2 capsules by mouth daily. AlgaeCal Plus (Calcium ascorbate/ cholecalciferol/ menaquinone-7/ calcium/ magnesium/ boron), Disp: , Rfl:  .  NON FORMULARY, Take 1 capsule by mouth daily. [place] (Lutein/ Zeaxanthin isomers/ Saffron extract), Disp: , Rfl:  .  NON FORMULARY, Take 1 capsule by mouth daily. LifeSeasons Anxie-T (Magnesium taurinate/ L-Theanine/ Ashwaganda/ GABA/ Kava Kava), Disp: , Rfl:  .  OVER THE COUNTER MEDICATION, Take 1 capsule by mouth daily. Ultranol Total Bladder Support (Cranberry/ Wendee Copp leaf powder/ Butcher's Broom/ Lotus leaf/ Marshmallow root/ Juniper), Disp: , Rfl:  .  OVER THE COUNTER MEDICATION, Take 3 tablets by mouth daily. Juvenon BloodFlow-7 (L-Citrulline/L-arginine), Disp: , Rfl:  .  pantoprazole (PROTONIX) 40 MG tablet, Take 1 tablet (40 mg total) by mouth daily., Disp: 30 tablet, Rfl: 0 .  predniSONE (DELTASONE) 20 MG tablet, Take 1 tablet (20 mg total) by mouth daily with breakfast., Disp: 13 tablet, Rfl: 0 .  Strontium Chloride CRYS, Take 680 mg by mouth at bedtime. , Disp: , Rfl:  .  tamoxifen (NOLVADEX) 20 MG tablet, Take 1 tablet (20 mg total) by mouth daily., Disp: 30 tablet, Rfl: 2 .  telmisartan (MICARDIS) 40 MG tablet, Take 1 tablet (40 mg total) by mouth daily., Disp: 90 tablet, Rfl: 3 .  Dextromethorphan-Guaifenesin 60-1200 MG 12hr tablet, Take 1 tablet by mouth every 12 (twelve) hours., Disp: 30 tablet, Rfl: 0  EXAM:  VITALS per patient if applicable:  GENERAL: alert, oriented, appears well and in no acute distress  HEENT: atraumatic, conjunttiva clear, no obvious abnormalities on  inspection of external nose and ears  NECK: normal movements of the head and neck  LUNGS: on inspection no signs of respiratory distress, breathing rate appears normal, no obvious gross SOB, gasping or wheezing  CV: no obvious cyanosis  MS: moves all visible extremities without noticeable abnormality  PSYCH/NEURO: pleasant and cooperative, no obvious depression or anxiety, speech and thought processing grossly intact  ASSESSMENT AND PLAN:  Discussed the following assessment and plan:  Radiation pneumonitis (HCC) - Plan: benzonatate (TESSALON) 200 MG capsule, Dextromethorphan-Guaifenesin 60-1200 MG 12hr tablet Complete prednisone 13 days outpatient  F/u pulm and will need CT chest repeat per hospital discharge  Will try to get cmet, cbc at [facility] faxed ordered  Will try to cancel lab 10/13/19 with h/o and keep f/u pt wants to do it virtually  Rad onc appt sch 10/25/2019 but wants to do virtually if possibly  Ordered PT/OT at [facility] consider H/H RN aid when ready to live alone again currently at SNF at  [facility]. Faxed order for thi stoday   Diarrhea  rec align/renew probiotics vs yogurt with probiotics likely due to diarrhea 2/2 abx use   -we discussed possible serious and likely etiologies, options for evaluation and workup, limitations of telemedicine visit vs in person visit, treatment, treatment risks and precautions. Pt prefers to treat via telemedicine empirically rather then risking or undertaking an in person visit at this moment. Patient agrees to seek prompt in person care if worsening, new symptoms arise, or if is not improving with treatment.   I discussed the assessment and treatment plan with the patient. The patient was provided an opportunity to ask questions and all were answered. The patient agreed with the plan and demonstrated an understanding of the instructions.   The patient was advised to call back or seek an in-person evaluation if the symptoms worsen or if  the condition fails to improve as anticipated.  Time spent 25 minutes  Delorise Jackson, MD

## 2019-10-08 DIAGNOSIS — J700 Acute pulmonary manifestations due to radiation: Secondary | ICD-10-CM | POA: Diagnosis not present

## 2019-10-08 DIAGNOSIS — I10 Essential (primary) hypertension: Secondary | ICD-10-CM | POA: Diagnosis not present

## 2019-10-08 DIAGNOSIS — C50919 Malignant neoplasm of unspecified site of unspecified female breast: Secondary | ICD-10-CM | POA: Diagnosis not present

## 2019-10-08 DIAGNOSIS — J189 Pneumonia, unspecified organism: Secondary | ICD-10-CM | POA: Diagnosis not present

## 2019-10-08 DIAGNOSIS — F39 Unspecified mood [affective] disorder: Secondary | ICD-10-CM | POA: Diagnosis not present

## 2019-10-12 ENCOUNTER — Emergency Department: Payer: Medicare Other

## 2019-10-12 ENCOUNTER — Other Ambulatory Visit: Payer: Self-pay

## 2019-10-12 ENCOUNTER — Encounter (HOSPITAL_COMMUNITY): Payer: Self-pay

## 2019-10-12 ENCOUNTER — Emergency Department
Admission: EM | Admit: 2019-10-12 | Discharge: 2019-10-12 | Disposition: A | Discharge status: Other Acute Inpatient Hospital | Payer: Medicare Other | Attending: Emergency Medicine | Admitting: Emergency Medicine

## 2019-10-12 ENCOUNTER — Inpatient Hospital Stay (HOSPITAL_COMMUNITY)
Admission: AD | Admit: 2019-10-12 | Discharge: 2019-11-06 | DRG: 177 | Disposition: E | Payer: Medicare Other | Source: Other Acute Inpatient Hospital | Attending: Internal Medicine | Admitting: Internal Medicine

## 2019-10-12 ENCOUNTER — Ambulatory Visit: Payer: Medicare Other | Admitting: Radiation Oncology

## 2019-10-12 DIAGNOSIS — J9601 Acute respiratory failure with hypoxia: Secondary | ICD-10-CM | POA: Diagnosis present

## 2019-10-12 DIAGNOSIS — Z823 Family history of stroke: Secondary | ICD-10-CM | POA: Diagnosis not present

## 2019-10-12 DIAGNOSIS — Z79810 Long term (current) use of selective estrogen receptor modulators (SERMs): Secondary | ICD-10-CM | POA: Diagnosis not present

## 2019-10-12 DIAGNOSIS — I480 Paroxysmal atrial fibrillation: Secondary | ICD-10-CM | POA: Diagnosis not present

## 2019-10-12 DIAGNOSIS — K219 Gastro-esophageal reflux disease without esophagitis: Secondary | ICD-10-CM | POA: Diagnosis present

## 2019-10-12 DIAGNOSIS — U071 COVID-19: Principal | ICD-10-CM | POA: Diagnosis present

## 2019-10-12 DIAGNOSIS — I959 Hypotension, unspecified: Secondary | ICD-10-CM | POA: Diagnosis present

## 2019-10-12 DIAGNOSIS — Z801 Family history of malignant neoplasm of trachea, bronchus and lung: Secondary | ICD-10-CM

## 2019-10-12 DIAGNOSIS — C50912 Malignant neoplasm of unspecified site of left female breast: Secondary | ICD-10-CM | POA: Diagnosis present

## 2019-10-12 DIAGNOSIS — J700 Acute pulmonary manifestations due to radiation: Secondary | ICD-10-CM | POA: Diagnosis present

## 2019-10-12 DIAGNOSIS — L8990 Pressure ulcer of unspecified site, unspecified stage: Secondary | ICD-10-CM | POA: Diagnosis not present

## 2019-10-12 DIAGNOSIS — R7989 Other specified abnormal findings of blood chemistry: Secondary | ICD-10-CM

## 2019-10-12 DIAGNOSIS — K5790 Diverticulosis of intestine, part unspecified, without perforation or abscess without bleeding: Secondary | ICD-10-CM | POA: Diagnosis present

## 2019-10-12 DIAGNOSIS — F419 Anxiety disorder, unspecified: Secondary | ICD-10-CM | POA: Diagnosis not present

## 2019-10-12 DIAGNOSIS — I110 Hypertensive heart disease with heart failure: Secondary | ICD-10-CM | POA: Diagnosis present

## 2019-10-12 DIAGNOSIS — Z515 Encounter for palliative care: Secondary | ICD-10-CM | POA: Diagnosis not present

## 2019-10-12 DIAGNOSIS — Z8042 Family history of malignant neoplasm of prostate: Secondary | ICD-10-CM

## 2019-10-12 DIAGNOSIS — J9621 Acute and chronic respiratory failure with hypoxia: Secondary | ICD-10-CM | POA: Diagnosis not present

## 2019-10-12 DIAGNOSIS — Y842 Radiological procedure and radiotherapy as the cause of abnormal reaction of the patient, or of later complication, without mention of misadventure at the time of the procedure: Secondary | ICD-10-CM | POA: Diagnosis present

## 2019-10-12 DIAGNOSIS — G9340 Encephalopathy, unspecified: Secondary | ICD-10-CM | POA: Diagnosis not present

## 2019-10-12 DIAGNOSIS — D638 Anemia in other chronic diseases classified elsewhere: Secondary | ICD-10-CM | POA: Diagnosis present

## 2019-10-12 DIAGNOSIS — Z888 Allergy status to other drugs, medicaments and biological substances status: Secondary | ICD-10-CM

## 2019-10-12 DIAGNOSIS — Z8249 Family history of ischemic heart disease and other diseases of the circulatory system: Secondary | ICD-10-CM | POA: Diagnosis not present

## 2019-10-12 DIAGNOSIS — I5033 Acute on chronic diastolic (congestive) heart failure: Secondary | ICD-10-CM | POA: Diagnosis present

## 2019-10-12 DIAGNOSIS — J1289 Other viral pneumonia: Secondary | ICD-10-CM | POA: Diagnosis present

## 2019-10-12 DIAGNOSIS — R0602 Shortness of breath: Secondary | ICD-10-CM | POA: Diagnosis not present

## 2019-10-12 DIAGNOSIS — M1990 Unspecified osteoarthritis, unspecified site: Secondary | ICD-10-CM | POA: Diagnosis present

## 2019-10-12 DIAGNOSIS — E785 Hyperlipidemia, unspecified: Secondary | ICD-10-CM | POA: Diagnosis present

## 2019-10-12 DIAGNOSIS — J22 Unspecified acute lower respiratory infection: Secondary | ICD-10-CM | POA: Diagnosis not present

## 2019-10-12 DIAGNOSIS — Z66 Do not resuscitate: Secondary | ICD-10-CM | POA: Diagnosis present

## 2019-10-12 LAB — CBC WITH DIFFERENTIAL/PLATELET
Abs Immature Granulocytes: 0.07 10*3/uL (ref 0.00–0.07)
Basophils Absolute: 0 10*3/uL (ref 0.0–0.1)
Basophils Relative: 0 %
Eosinophils Absolute: 0 10*3/uL (ref 0.0–0.5)
Eosinophils Relative: 0 %
HCT: 36.7 % (ref 36.0–46.0)
Hemoglobin: 11.9 g/dL — ABNORMAL LOW (ref 12.0–15.0)
Immature Granulocytes: 1 %
Lymphocytes Relative: 6 %
Lymphs Abs: 0.4 10*3/uL — ABNORMAL LOW (ref 0.7–4.0)
MCH: 28.6 pg (ref 26.0–34.0)
MCHC: 32.4 g/dL (ref 30.0–36.0)
MCV: 88.2 fL (ref 80.0–100.0)
Monocytes Absolute: 0.2 10*3/uL (ref 0.1–1.0)
Monocytes Relative: 3 %
Neutro Abs: 6.5 10*3/uL (ref 1.7–7.7)
Neutrophils Relative %: 90 %
Platelets: 264 10*3/uL (ref 150–400)
RBC: 4.16 MIL/uL (ref 3.87–5.11)
RDW: 12.9 % (ref 11.5–15.5)
WBC: 7.2 10*3/uL (ref 4.0–10.5)
nRBC: 0 % (ref 0.0–0.2)

## 2019-10-12 LAB — BASIC METABOLIC PANEL
Anion gap: 14 (ref 5–15)
BUN: 21 mg/dL (ref 8–23)
CO2: 23 mmol/L (ref 22–32)
Calcium: 8.7 mg/dL — ABNORMAL LOW (ref 8.9–10.3)
Chloride: 100 mmol/L (ref 98–111)
Creatinine, Ser: 0.68 mg/dL (ref 0.44–1.00)
GFR calc Af Amer: >60 mL/min (ref >60)
GFR calc non Af Amer: >60 mL/min (ref >60)
Glucose, Bld: 94 mg/dL (ref 70–99)
Potassium: 4 mmol/L (ref 3.5–5.1)
Sodium: 137 mmol/L (ref 135–145)

## 2019-10-12 LAB — C-REACTIVE PROTEIN: CRP: 8.7 mg/dL — ABNORMAL HIGH (ref <1.0)

## 2019-10-12 LAB — FERRITIN: Ferritin: 481 ng/mL — ABNORMAL HIGH (ref 11–307)

## 2019-10-12 LAB — APTT: aPTT: <24 seconds — ABNORMAL LOW (ref 24–36)

## 2019-10-12 LAB — LACTIC ACID, PLASMA: Lactic Acid, Venous: 1.6 mmol/L (ref 0.5–1.9)

## 2019-10-12 LAB — HEPATIC FUNCTION PANEL
ALT: 18 U/L (ref 0–44)
AST: 31 U/L (ref 15–41)
Albumin: 3.2 g/dL — ABNORMAL LOW (ref 3.5–5.0)
Alkaline Phosphatase: 75 U/L (ref 38–126)
Bilirubin, Direct: 0.2 mg/dL (ref 0.0–0.2)
Indirect Bilirubin: 0.4 mg/dL (ref 0.3–0.9)
Total Bilirubin: 0.6 mg/dL (ref 0.3–1.2)
Total Protein: 6.9 g/dL (ref 6.5–8.1)

## 2019-10-12 LAB — PROTIME-INR
INR: 1 (ref 0.8–1.2)
Prothrombin Time: 12.6 seconds (ref 11.4–15.2)

## 2019-10-12 LAB — PROCALCITONIN: Procalcitonin: <0.1 ng/mL

## 2019-10-12 LAB — TRIGLYCERIDES: Triglycerides: 115 mg/dL (ref <150)

## 2019-10-12 LAB — HEPATITIS B SURFACE ANTIGEN: Hepatitis B Surface Ag: NONREACTIVE

## 2019-10-12 LAB — FIBRIN DERIVATIVES D-DIMER (ARMC ONLY): Fibrin derivatives D-dimer (ARMC): >7500 ng/mL (FEU) — ABNORMAL HIGH (ref 0.00–499.00)

## 2019-10-12 LAB — FIBRINOGEN: Fibrinogen: 673 mg/dL — ABNORMAL HIGH (ref 210–475)

## 2019-10-12 LAB — LACTATE DEHYDROGENASE: LDH: 390 U/L — ABNORMAL HIGH (ref 98–192)

## 2019-10-12 MED ORDER — DM-GUAIFENESIN ER 30-600 MG PO TB12
2.0000 | ORAL_TABLET | Freq: Two times a day (BID) | ORAL | Status: DC
  Administered 2019-10-12 – 2019-10-18 (×11): 2 via ORAL
  Filled 2019-10-12: qty 1
  Filled 2019-10-12: qty 2
  Filled 2019-10-12: qty 1
  Filled 2019-10-12 (×7): qty 2
  Filled 2019-10-12: qty 1
  Filled 2019-10-12 (×3): qty 2

## 2019-10-12 MED ORDER — TRAZODONE HCL 50 MG PO TABS
25.0000 mg | ORAL_TABLET | Freq: Every day | ORAL | Status: DC
  Administered 2019-10-13 – 2019-10-18 (×3): 25 mg via ORAL
  Filled 2019-10-12 (×6): qty 1

## 2019-10-12 MED ORDER — SODIUM CHLORIDE 0.9 % IV SOLN
100.0000 mg | Freq: Every day | INTRAVENOUS | Status: DC
  Administered 2019-10-14 – 2019-10-16 (×3): 100 mg via INTRAVENOUS
  Filled 2019-10-12 (×5): qty 20

## 2019-10-12 MED ORDER — ACETAMINOPHEN 325 MG PO TABS
650.0000 mg | ORAL_TABLET | Freq: Four times a day (QID) | ORAL | Status: DC | PRN
  Administered 2019-10-21: 650 mg via ORAL
  Filled 2019-10-12: qty 2

## 2019-10-12 MED ORDER — DEXAMETHASONE SODIUM PHOSPHATE 10 MG/ML IJ SOLN
10.0000 mg | Freq: Once | INTRAMUSCULAR | Status: AC
  Administered 2019-10-12: 10 mg via INTRAVENOUS
  Filled 2019-10-12: qty 1

## 2019-10-12 MED ORDER — HEPARIN (PORCINE) 25000 UT/250ML-% IV SOLN: 1000.0000 [IU]/h | INTRAVENOUS | Status: DC

## 2019-10-12 MED ORDER — HEPARIN (PORCINE) 25000 UT/250ML-% IV SOLN
1000.0000 [IU]/h | INTRAVENOUS | Status: DC
  Administered 2019-10-12: 1000 [IU]/h via INTRAVENOUS
  Filled 2019-10-12: qty 250

## 2019-10-12 MED ORDER — SODIUM CHLORIDE 0.9 % IV SOLN
200.0000 mg | Freq: Once | INTRAVENOUS | Status: AC
  Administered 2019-10-12: 200 mg via INTRAVENOUS
  Filled 2019-10-12: qty 200

## 2019-10-12 MED ORDER — SODIUM CHLORIDE 0.9 % IV SOLN: 100.0000 mg | Freq: Every day | INTRAVENOUS | Status: DC

## 2019-10-12 MED ORDER — ENOXAPARIN SODIUM 40 MG/0.4ML ~~LOC~~ SOLN
40.0000 mg | SUBCUTANEOUS | Status: DC
  Administered 2019-10-12: 40 mg via SUBCUTANEOUS
  Filled 2019-10-12: qty 0.4

## 2019-10-12 MED ORDER — PANTOPRAZOLE SODIUM 40 MG PO TBEC
40.0000 mg | DELAYED_RELEASE_TABLET | Freq: Every day | ORAL | Status: DC
  Administered 2019-10-12 – 2019-10-18 (×7): 40 mg via ORAL
  Filled 2019-10-12 (×7): qty 1

## 2019-10-12 MED ORDER — TOCILIZUMAB 400 MG/20ML IV SOLN
8.0000 mg/kg | Freq: Once | INTRAVENOUS | Status: AC
  Administered 2019-10-12: 546 mg via INTRAVENOUS
  Filled 2019-10-12: qty 10

## 2019-10-12 MED ORDER — ONDANSETRON HCL 4 MG/2ML IJ SOLN: 4.0000 mg | Freq: Four times a day (QID) | INTRAMUSCULAR | Status: DC | PRN

## 2019-10-12 MED ORDER — ORAL CARE MOUTH RINSE
15.0000 mL | Freq: Two times a day (BID) | OROMUCOSAL | Status: DC
  Administered 2019-10-12 – 2019-10-22 (×17): 15 mL via OROMUCOSAL

## 2019-10-12 MED ORDER — ALBUTEROL SULFATE HFA 108 (90 BASE) MCG/ACT IN AERS
2.0000 | INHALATION_SPRAY | Freq: Four times a day (QID) | RESPIRATORY_TRACT | Status: DC | PRN
  Administered 2019-10-16: 2 via RESPIRATORY_TRACT
  Filled 2019-10-12: qty 6.7

## 2019-10-12 MED ORDER — HEPARIN BOLUS VIA INFUSION: 4000.0000 [IU] | Freq: Once | INTRAVENOUS | Status: DC

## 2019-10-12 MED ORDER — SODIUM CHLORIDE 0.9% FLUSH
3.0000 mL | Freq: Two times a day (BID) | INTRAVENOUS | Status: DC
  Administered 2019-10-12 – 2019-10-22 (×18): 3 mL via INTRAVENOUS

## 2019-10-12 MED ORDER — ONDANSETRON HCL 4 MG PO TABS: 4.0000 mg | ORAL_TABLET | Freq: Four times a day (QID) | ORAL | Status: DC | PRN

## 2019-10-12 MED ORDER — METHYLPREDNISOLONE SODIUM SUCC 40 MG IJ SOLR
0.5000 mg/kg | Freq: Two times a day (BID) | INTRAMUSCULAR | Status: DC
  Administered 2019-10-12 – 2019-10-13 (×2): 34 mg via INTRAVENOUS
  Filled 2019-10-12 (×2): qty 1

## 2019-10-12 MED ORDER — BISACODYL 5 MG PO TBEC: 5.0000 mg | DELAYED_RELEASE_TABLET | Freq: Every day | ORAL | Status: DC | PRN

## 2019-10-12 MED ORDER — SODIUM CHLORIDE 0.9 % IV SOLN: 200.0000 mg | Freq: Once | INTRAVENOUS | Status: DC

## 2019-10-12 MED ORDER — HEPARIN SODIUM (PORCINE) 5000 UNIT/ML IJ SOLN
4000.0000 [IU] | Freq: Once | INTRAMUSCULAR | Status: AC
  Administered 2019-10-12: 4000 [IU] via INTRAVENOUS
  Filled 2019-10-12: qty 1

## 2019-10-12 NOTE — ED Notes (Signed)
Pt on 5L at this time

## 2019-10-12 NOTE — Consult Note (Addendum)
ANTICOAGULATION CONSULT NOTE - Initial Consult  Pharmacy Consult for Heparin Infusion Indication: Possible pulmonary embolus  Allergies  Allergen Reactions  . Lexapro [Escitalopram Oxalate] Itching   Patient Measurements: Height: 5\' 6"  (167.6 cm) Weight: 150 lb 5.5 oz (68.2 kg) IBW/kg (Calculated) : 59.3  Vital Signs: Temp: 98.4 F (36.9 C) (12/07 0507) Temp Source: Oral (12/07 0507) BP: 142/57 (12/07 0507) Pulse Rate: 84 (12/07 0645)  Labs: Recent Labs    10/10/2019 0513  HGB 11.9*  HCT 36.7  PLT 264  CREATININE 0.68    Estimated Creatinine Clearance: 50.8 mL/min (by C-G formula based on SCr of 0.68 mg/dL).   Medical History: Past Medical History:  Diagnosis Date  . Anemia    PMH  . Arthritis   . Cancer [facility])    left breast cancer and right abnormal mammogram  . Depression    since death of husband in 23-Feb-2014   . Diverticulosis   . Essential hypertension   . Gallstones   . Gastritis and duodenitis    dounf by CT Nov 2012  . Hyperlipemia   . Pneumonia    hx of  . Seasonal allergies   . Wears glasses     Assessment: Pharmacy consulted for heparin infusion dosing and monitoring for 84 yo female for high concern for PE development in the setting of COVID-19. D-Dimer >7,500.00  Goal of Therapy:  Heparin level 0.3-0.7 units/ml Monitor platelets by anticoagulation protocol: Yes   Plan:  Baseline labs ordered Give 4000 units bolus x 1 Start heparin infusion at 1000 units/hr Check anti-Xa level in 8 hours and daily while on heparin Continue to monitor H&H and platelets  Pernell Dupre, PharmD, BCPS Clinical Pharmacist 10/08/2019 6:59 AM

## 2019-10-12 NOTE — H&P (Signed)
TRH H&P   Patient Demographics:    Elaine Price, is a 84 y.o. female  MRN: [id number]   DOB - 1935/03/29  Admit Date - 10/18/2019  Outpatient Primary MD for the patient is McLean-Scocuzza, Nino Glow, MD    Patient coming from: [facility] ED  CC - SOB    HPI:    Elaine Price  is a 84 y.o. female, with history significant for breast cancer, essential hypertension, dyslipidemia, anemia of chronic disease, diverticulosis without diverticulitis, recent admission for pneumonia.  Patient otherwise is quite healthy and independent still lives alone and drives, has been sick for around a month now.  Was recently admitted for pneumonia to [facility] hospital and discharged few weeks ago, she was diagnosed recently in the outpatient setting with COVID-19 infection 2 days prior to her ER visit, her daughter also tested positive.  In the [place] ER she was diagnosed with acute hypoxic respiratory failure due to COVID-19 pneumonitis, she was placed on a nonrebreather mask, she was started on IV steroids and remdesivir and transferred here to [facility] for further care.  Patient currently has only shortness of breath and a dry cough as her complaints, denies any headache, no chest or abdominal pain, no productive phlegm, is short of breath, shortness of breath is worse with exertion better with rest, no other subjective complaints.    Review of systems:    A full 10 point Review of Systems was done, except as stated above, all other Review of Systems were negative.   With Past History of the following :    Past Medical History:  Diagnosis Date  . Anemia    PMH  . Arthritis   . Cancer [facility])    left breast cancer and right abnormal  mammogram  . Depression    since death of husband in 05-Feb-2014   . Diverticulosis   . Essential hypertension   . Gallstones   . Gastritis and duodenitis    dounf by CT Nov 2012  . Hyperlipemia   . Pneumonia    hx of  . Seasonal allergies   . Wears glasses       Past Surgical History:  Procedure Laterality Date  . ABDOMINAL HYSTERECTOMY    . ACROMIO-CLAVICULAR JOINT REPAIR Right 07/04/2015   Procedure: ACROMIO-CLAVICULAR JOINT REPAIR;  Surgeon: Tania Ade, MD;  Location: [place];  Service: Orthopedics;  Laterality: Right;  Right open reduction internal fixation clavical with allograft, coracoclaviular reconstruction  . APPENDECTOMY    . BREAST BIOPSY Right 12/28/2018   affirm bx-"x" clip-path pending  . BREAST BIOPSY Left 05/28/2019   Affirm Biopsy- Coil clip- path pending  . BREAST LUMPECTOMY WITH RADIOACTIVE SEED AND SENTINEL LYMPH NODE BIOPSY Left 07/02/2019   Procedure: LEFT BREAST LUMPECTOMY WITH RADIOACTIVE SEED AND LEFT SENTINEL LYMPH NODE BIOPSY;  Surgeon: [name], MD;  Location: [place];  Service: General;  Laterality: Left;  . CHOLECYSTECTOMY  2012  . CHOLECYSTECTOMY, LAPAROSCOPIC  08/06/2011   gallstones  . COLONOSCOPY  02/18/2006   2 mm rectal polyp, diverticulosis  . ORIF CLAVICULAR FRACTURE Right 07/04/2015   Procedure: OPEN REDUCTION INTERNAL FIXATION (ORIF) CLAVICULAR FRACTURE;  Surgeon: Tania Ade, MD;  Location: [place];  Service: Orthopedics;  Laterality: Right;  . RADIOACTIVE SEED GUIDED EXCISIONAL BREAST BIOPSY Right 07/02/2019   Procedure: RIGHT BREAST  RADIOACTIVE SEED LOCALIZATION EXCISIONAL BIOPSY;  Surgeon: [name], MD;  Location: [place];  Service: General;  Laterality: Right;  . TONSILECTOMY, ADENOIDECTOMY, BILATERAL MYRINGOTOMY AND TUBES     as a child, does not remember date      Social History:     Social History   Tobacco Use  . Smoking status: Never Smoker  . Smokeless tobacco: Never Used   Substance Use Topics  . Alcohol use: Yes    Comment: rare         Family History :     Family History  Problem Relation Age of Onset  . Stroke Mother   . Heart attack Mother   . Prostate cancer Father   . Lung cancer Brother   . Prostate cancer Brother   . Hypertension Brother   . Colon cancer Neg Hx        Home Medications:   Prior to Admission medications   Medication Sig Start Date End Date Taking? Authorizing Provider  ALOE VERA PO Take 1 Dose by mouth daily as needed (indigestion).    [provider]  benzonatate (TESSALON) 200 MG capsule Take 1 capsule (200 mg total) by mouth 2 (two) times daily as needed for cough. 10/06/19   McLean-Scocuzza, Nino Glow, MD  cefdinir (OMNICEF) 300 MG capsule Take 1 capsule (300 mg total) by mouth every 12 (twelve) hours. 09/30/19   Nolberto Hanlon, MD  Dextromethorphan-Guaifenesin 60-1200 MG 12hr tablet Take 1 tablet by mouth every 12 (twelve) hours. 10/06/19   McLean-Scocuzza, Nino Glow, MD  guaiFENesin (MUCINEX) 600 MG 12 hr tablet Take 1 tablet (600 mg total) by mouth 2 (two) times daily. 09/30/19   Nolberto Hanlon, MD  NON FORMULARY Take 1-2 capsules by mouth daily. AlgaeCal Plus (Calcium ascorbate/ cholecalciferol/ menaquinone-7/ calcium/ magnesium/ boron)    [provider]  NON FORMULARY Take 1 capsule by mouth daily. [place] Formula (Lutein/ Zeaxanthin isomers/ Saffron extract)    [provider]  NON FORMULARY Take 1 capsule by mouth daily. LifeSeasons Anxie-T (Magnesium taurinate/ L-Theanine/ Ashwaganda/ GABA/ Kava Kava)    [provider]  OVER THE COUNTER MEDICATION Take 1 capsule by mouth daily. Ultranol Total Bladder Support (Cranberry/ Wendee Copp leaf powder/ Butcher's Broom/ Lotus leaf/ Marshmallow root/ Juniper)    [provider]  OVER THE COUNTER MEDICATION Take 3 tablets by mouth daily. Juvenon BloodFlow-7 (L-Citrulline/L-arginine)    [provider]  pantoprazole  (PROTONIX) 40 MG tablet Take 1 tablet (40  mg total) by mouth daily. 09/30/19 09/29/20  Nolberto Hanlon, MD  predniSONE (DELTASONE) 20 MG tablet Take 1 tablet (20 mg total) by mouth daily with breakfast. 10/01/19   Nolberto Hanlon, MD  Strontium Chloride CRYS Take 680 mg by mouth at bedtime.     [provider]  tamoxifen (NOLVADEX) 20 MG tablet Take 1 tablet (20 mg total) by mouth daily. 09/14/19   Lequita Asal, MD  telmisartan (MICARDIS) 40 MG tablet Take 1 tablet (40 mg total) by mouth daily. 06/08/19   McLean-Scocuzza, Nino Glow, MD     Allergies:     Allergies  Allergen Reactions  . Lexapro [Escitalopram Oxalate] Itching     Physical Exam:   Vitals  From [place] ER 1 hour ago.  Temperature 97.8, pulse 82, respiratory rate 23, blood pressure 130/66.  Pulse oximetry 94% on nonrebreather mask.   1. General frail elderly Caucasian female lying in hospital bed in mild respiratory distress wearing a nonrebreather mask,  2. Normal affect and insight, Not Suicidal or Homicidal, Awake Alert, Oriented X 3.  3. No F.N deficits, ALL C.Nerves Intact, Strength 5/5 all 4 extremities, Sensation intact all 4 extremities, Plantars down going.  4. Ears and Eyes appear Normal, Conjunctivae clear, PERRLA. Moist Oral Mucosa.  5. Supple Neck, No JVD, No cervical lymphadenopathy appriciated, No Carotid Bruits.  6. Symmetrical Chest wall movement, Good air movement bilaterally, CTAB.  7. RRR, No Gallops, Rubs or Murmurs, No Parasternal Heave.  8. Positive Bowel Sounds, Abdomen Soft, No tenderness, No organomegaly appriciated,No rebound -guarding or rigidity.  9.  No Cyanosis, Normal Skin Turgor, No Skin Rash or Bruise.  10. Good muscle tone,  joints appear normal , no effusions, Normal ROM.  11. No Palpable Lymph Nodes in Neck or Axillae      Data Review:    CBC Recent Labs  Lab 10/28/2019 0513  WBC 7.2  HGB 11.9*  HCT 36.7  PLT 264  MCV 88.2  MCH 28.6  MCHC 32.4  RDW 12.9   LYMPHSABS 0.4*  MONOABS 0.2  EOSABS 0.0  BASOSABS 0.0   ------------------------------------------------------------------------------------------------------------------  Chemistries  Recent Labs  Lab 11/02/2019 0513  NA 137  K 4.0  CL 100  CO2 23  GLUCOSE 94  BUN 21  CREATININE 0.68  CALCIUM 8.7*  AST 31  ALT 18  ALKPHOS 75  BILITOT 0.6   ------------------------------------------------------------------------------------------------------------------ estimated creatinine clearance is 50.8 mL/min (by C-G formula based on SCr of 0.68 mg/dL). ------------------------------------------------------------------------------------------------------------------ No results for input(s): TSH, T4TOTAL, T3FREE, THYROIDAB in the last 72 hours.  Invalid input(s): FREET3  Coagulation profile Recent Labs  Lab 11/04/2019 0513  INR 1.0   ------------------------------------------------------------------------------------------------------------------- No results for input(s): DDIMER in the last 72 hours. -------------------------------------------------------------------------------------------------------------------  Cardiac Enzymes No results for input(s): CKMB, TROPONINI, MYOGLOBIN in the last 168 hours.  Invalid input(s): CK ------------------------------------------------------------------------------------------------------------------ No results found for: BNP   ---------------------------------------------------------------------------------------------------------------  Urinalysis    Component Value Date/Time   COLORURINE YELLOW (A) 09/26/2019 1801   APPEARANCEUR HAZY (A) 09/26/2019 1801   LABSPEC 1.010 09/26/2019 1801   PHURINE 5.0 09/26/2019 1801   GLUCOSEU NEGATIVE 09/26/2019 1801   GLUCOSEU NEGATIVE 01/13/2013 0829   HGBUR NEGATIVE 09/26/2019 1801   BILIRUBINUR NEGATIVE 09/26/2019 1801   BILIRUBINUR neg 07/04/2011 1448   KETONESUR 5 (A) 09/26/2019 1801    PROTEINUR NEGATIVE 09/26/2019 1801   UROBILINOGEN 0.2 01/13/2013 0829   NITRITE NEGATIVE 09/26/2019 1801   LEUKOCYTESUR NEGATIVE 09/26/2019 1801    ----------------------------------------------------------------------------------------------------------------  Imaging Results:    Dg Chest 1 View  Result Date: 10/10/2019 CLINICAL DATA:  Shortness of breath.  COVID-19 positive EXAM: CHEST  1 VIEW COMPARISON:  09/26/2019 FINDINGS: Bilateral pulmonary infiltrate. Infiltrates were also seen November 2020, but opacity is denser and more widespread today. No edema, effusion, or pneumothorax. Normal heart size. No acute osseous finding. IMPRESSION: Bilateral pneumonia. Electronically Signed   By: Monte Fantasia M.D.   On: 11/05/2019 05:32    My personal review of EKG: Rhythm NSR, Rate  97 /min, right bundle branch block no Acute ST changes   Assessment & Plan:    1.  Acute hypoxic respiratory failure due to COVID-19 pneumonitis.  She is severely sick, she is currently on nonrebreather mask with elevated inflammatory markers, she has been already started on IV steroids and remdesivir yesterday at [facility] ER which will be continued.  Actemra will be given ASAP.  Continue full supportive care.  She has been encouraged to sit up in chair in the daytime use flutter valve and I-S for pulmonary toiletry and prone in bed at night.  Plan was also discussed with patient's daughter by me over the phone.  Actemra off label use - patient was told that if COVID-19 pneumonitis gets worse we might potentially use Actemra off label, she denies any known history of tuberculosis or hepatitis, understands the risks and benefits and wants to proceed with Actemra treatment if required.  Daughter agreeable as well.  COVID-19 Labs  Recent Labs    11/04/2019 0513  FERRITIN 481*  LDH 390*  CRP 8.7*    2.  Hypertension.  Currently blood pressure is stable as needed as needed hydralazine.  3.  History of breast  cancer.  Supportive care.  4.  Chronic grade 1 diastolic CHF as noted on echocardiogram done in June 2019.  EF 60%.  Currently compensated.    DVT Prophylaxis Heparin   AM Labs Ordered, also please review Full Orders  Family Communication: Admission, patients condition and plan of care including tests being ordered have been discussed with the patient and daughter who indicate understanding and agree with the plan and Code Status.  Code Status DNR  Likely DC to  TBD  Condition GUARDED    Consults called: None    Admission status: Inpt    Time spent in minutes : 35   Lala Lund M.D on 10/11/2019 at 5:28 PM  To page go to [email or web address] - password [facility]

## 2019-10-12 NOTE — ED Notes (Addendum)
Into patient's room, sats on 5L/ [place] dropped to 84-86%.  Patient repositioned.Encouraged to C+DB.  No change in sat.  No SOB/ DOE noted.  Dr. Charna Archer alerted and to bedside to evaluate patient.  Increased oxygen to 6l/ , no change in sat.  Placed on 100% NRB, sats improved to 97-98%.  Respiratory called to placed patient on hight flow oxygen.  Continue to monitor.

## 2019-10-12 NOTE — Progress Notes (Signed)
Patient is a mouth breather and O2 sats kept dropping. Placed patient on NRB for sleep only. Paged MD on call for something for anxiety/sleep for her to tolerate NRB for sleep. New order for 25mg  PO trazodone. Will continue to monitor.

## 2019-10-12 NOTE — Consult Note (Signed)
Remdesivir - Pharmacy Brief Note  Patient admitted from [facility] with worsening SOB.  Reported positive COVID-19 test 12/6 outpatient. Per MD, Labs consistent with COVID-19   O:  ALT: 18 CXR: Bilateral pneumonia.  SpO2: 78% on room air. 100% on 6L.(No oxygen requirement at baseline)   A/P:  Remdesivir 200 mg IVPB once followed by Remdesivir 100 mg IVPB daily x 4 days.   Pernell Dupre, PharmD, BCPS Clinical Pharmacist 10/30/2019 6:04 AM

## 2019-10-12 NOTE — ED Provider Notes (Signed)
[facility] Emergency Department Provider Note  ____________________________________________   First MD Initiated Contact with Patient 10/30/2019 [phone]     (approximate)  I have reviewed the triage vital signs and the nursing notes.   HISTORY  Chief Complaint Shortness of Breath  Level 5 caveat:  history/ROS limited by acute/critical illness  HPI Elaine Price is a 84 y.o. female with medical history as listed below which notably includes a recent hospitalization for what was thought to be community-acquired versus viral pneumonia although she had 4 prior negative COVID-19 test.  She was discharged with no oxygen requirement.  That discharge occurred about 2 weeks ago but she has continued to be short of breath.   She has followed up with her primary care doctor.  She was generally doing well but over the last couple of days has started to do worse with increased fatigue, generalized weakness, and increased shortness of breath.  According to the paramedics she tested Covid positive yesterday.  Tonight she was sent by EMS to the emergency department for shortness of breath and hypoxemia.  She has no oxygen requirement at baseline.  She reports no pain including chest pain.  She says that she knows she is a little bit short of breath but mostly she is very tired and weak.  She has not had any recent fever or chills although she did have fever within the last 2 weeks.  She denies nausea and vomiting and admits to decreased oral intake.  She describes her symptoms as severe and nothing in particular is making her better or worse.  She was 78% SPO2 on room air when EMS arrived and on room air in the emergency department.  On 6 L by nasal cannula she was satting 100%.  See below for additional details.        Past Medical History:  Diagnosis Date  . Anemia    PMH  . Arthritis   . Cancer [facility])    left breast cancer and right abnormal mammogram  .  Depression    since death of husband in 2014/02/08   . Diverticulosis   . Essential hypertension   . Gallstones   . Gastritis and duodenitis    dounf by CT Nov 2012  . Hyperlipemia   . Pneumonia    hx of  . Seasonal allergies   . Wears glasses     Patient Active Problem List   Diagnosis Date Noted  . Weakness 09/26/2019  . Diminished night vision 09/15/2019  . Goals of care, counseling/discussion 07/21/2019  . Ductal carcinoma in situ (DCIS) of right breast 07/21/2019  . Atypical ductal hyperplasia of right breast 06/04/2019  . Breast cancer, left ([place]) 05/28/2019  . Prediabetes 03/03/2019  . Depression, recurrent ([place]) 03/25/2018  . Cardiac murmur 03/25/2018  . Chronic hip pain, right 02/18/2018  . Osteoporosis 03/14/2017  . Peripheral vascular disease ([place]) 01/25/2017  . Hyperlipidemia LDL goal <100 01/25/2017  . Generalized anxiety disorder 02/06/2014  . Routine general medical examination at a health care facility 01/19/2013  . Back pain, thoracic 10/02/2011  . Essential hypertension 07/03/2011    Past Surgical History:  Procedure Laterality Date  . ABDOMINAL HYSTERECTOMY    . ACROMIO-CLAVICULAR JOINT REPAIR Right 07/04/2015   Procedure: ACROMIO-CLAVICULAR JOINT REPAIR;  Surgeon: Tania Ade, MD;  Location: [place];  Service: Orthopedics;  Laterality: Right;  Right open reduction internal fixation clavical with allograft, coracoclaviular reconstruction  . APPENDECTOMY    .  BREAST BIOPSY Right 12/28/2018   affirm bx-"x" clip-path pending  . BREAST BIOPSY Left 05/28/2019   Affirm Biopsy- Coil clip- path pending  . BREAST LUMPECTOMY WITH RADIOACTIVE SEED AND SENTINEL LYMPH NODE BIOPSY Left 07/02/2019   Procedure: LEFT BREAST LUMPECTOMY WITH RADIOACTIVE SEED AND LEFT SENTINEL LYMPH NODE BIOPSY;  Surgeon: [name], MD;  Location: [place];  Service: General;  Laterality: Left;  . CHOLECYSTECTOMY  2012  . CHOLECYSTECTOMY, LAPAROSCOPIC  08/06/2011    gallstones  . COLONOSCOPY  02/18/2006   2 mm rectal polyp, diverticulosis  . ORIF CLAVICULAR FRACTURE Right 07/04/2015   Procedure: OPEN REDUCTION INTERNAL FIXATION (ORIF) CLAVICULAR FRACTURE;  Surgeon: Tania Ade, MD;  Location: [place];  Service: Orthopedics;  Laterality: Right;  . RADIOACTIVE SEED GUIDED EXCISIONAL BREAST BIOPSY Right 07/02/2019   Procedure: RIGHT BREAST  RADIOACTIVE SEED LOCALIZATION EXCISIONAL BIOPSY;  Surgeon: [name], MD;  Location: [place];  Service: General;  Laterality: Right;  . TONSILECTOMY, ADENOIDECTOMY, BILATERAL MYRINGOTOMY AND TUBES     as a child, does not remember date    Prior to Admission medications   Medication Sig Start Date End Date Taking? Authorizing Provider  ALOE VERA PO Take 1 Dose by mouth daily as needed (indigestion).    [provider]  benzonatate (TESSALON) 200 MG capsule Take 1 capsule (200 mg total) by mouth 2 (two) times daily as needed for cough. 10/06/19   McLean-Scocuzza, Nino Glow, MD  cefdinir (OMNICEF) 300 MG capsule Take 1 capsule (300 mg total) by mouth every 12 (twelve) hours. 09/30/19   Nolberto Hanlon, MD  Dextromethorphan-Guaifenesin 60-1200 MG 12hr tablet Take 1 tablet by mouth every 12 (twelve) hours. 10/06/19   McLean-Scocuzza, Nino Glow, MD  guaiFENesin (MUCINEX) 600 MG 12 hr tablet Take 1 tablet (600 mg total) by mouth 2 (two) times daily. 09/30/19   Nolberto Hanlon, MD  NON FORMULARY Take 1-2 capsules by mouth daily. AlgaeCal Plus (Calcium ascorbate/ cholecalciferol/ menaquinone-7/ calcium/ magnesium/ boron)    [provider]  NON FORMULARY Take 1 capsule by mouth daily. Blevins Formula (Lutein/ Zeaxanthin isomers/ Saffron extract)    [provider]  NON FORMULARY Take 1 capsule by mouth daily. LifeSeasons Anxie-T (Magnesium taurinate/ L-Theanine/ Ashwaganda/ GABA/ Kava Kava)    [provider]  OVER THE COUNTER MEDICATION Take 1 capsule by mouth daily.  Ultranol Total Bladder Support (Cranberry/ Wendee Copp leaf powder/ Butcher's Broom/ Lotus leaf/ Marshmallow root/ Juniper)    [provider]  OVER THE COUNTER MEDICATION Take 3 tablets by mouth daily. Juvenon BloodFlow-7 (L-Citrulline/L-arginine)    [provider]  pantoprazole (PROTONIX) 40 MG tablet Take 1 tablet (40 mg total) by mouth daily. 09/30/19 09/29/20  Nolberto Hanlon, MD  predniSONE (DELTASONE) 20 MG tablet Take 1 tablet (20 mg total) by mouth daily with breakfast. 10/01/19   Nolberto Hanlon, MD  Strontium Chloride CRYS Take 680 mg by mouth at bedtime.     [provider]  tamoxifen (NOLVADEX) 20 MG tablet Take 1 tablet (20 mg total) by mouth daily. 09/14/19   Lequita Asal, MD  telmisartan (MICARDIS) 40 MG tablet Take 1 tablet (40 mg total) by mouth daily. 06/08/19   McLean-Scocuzza, Nino Glow, MD    Allergies Lexapro [escitalopram oxalate]  Family History  Problem Relation Age of Onset  . Stroke Mother   . Heart attack Mother   . Prostate cancer Father   . Lung cancer Brother   . Prostate cancer Brother   .  Hypertension Brother   . Colon cancer Neg Hx     Social History Social History   Tobacco Use  . Smoking status: Never Smoker  . Smokeless tobacco: Never Used  Substance Use Topics  . Alcohol use: Yes    Comment: rare  . Drug use: No    Review of Systems Level 5 caveat:  history/ROS limited by acute/critical illness   Constitutional: No fever/chills.  Severe malaise and fatigue. Eyes: No visual changes. ENT: No sore throat. Cardiovascular: Denies chest pain. Respiratory: Severe shortness of breath.  Occasional cough. Gastrointestinal: No abdominal pain.  No nausea, no vomiting.  No diarrhea.  No constipation. Genitourinary: Negative for dysuria. Musculoskeletal: Negative for neck pain.  Negative for back pain. Integumentary: Negative for rash. Neurological: Negative for headaches, focal weakness or numbness.    ____________________________________________   PHYSICAL EXAM:  VITAL SIGNS: ED Triage Vitals  Enc Vitals Group     BP 11/05/2019 0507 (!) 142/57     Pulse Rate 10/21/2019 0507 85     Resp 10/17/2019 0507 33     Temp 10/17/2019 0507 98.4 F (36.9 C)     Temp Source 10/11/2019 0507 Oral     SpO2 10/16/2019 0507 (!) 78 %     Weight 10/15/2019 0508 68.2 kg (150 lb 5.5 oz)     Height 10/07/2019 0508 1.676 m (5\' 6" )     Head Circumference --      Peak Flow --      Pain Score 10/20/2019 0508 0     Pain Loc --      Pain Edu? --      Excl. in [place]? --     Constitutional: Alert and oriented.  Mild respiratory distress. Eyes: Conjunctivae are normal.  Head: Atraumatic. Nose: No congestion/rhinnorhea. Mouth/Throat: Patient is wearing a mask. Neck: No stridor.  No meningeal signs.   Cardiovascular: Normal rate, regular rhythm. Good peripheral circulation. Grossly normal heart sounds. Respiratory: Increased respiratory effort with tachypnea in the 30s, mild accessory muscle usage.  Minimally coarse breath sounds throughout.  No wheezing. Gastrointestinal: Soft and nontender. No distention.  Musculoskeletal: No lower extremity tenderness nor edema. No gross deformities of extremities. Neurologic:  Normal speech and language. No gross focal neurologic deficits are appreciated.  Skin:  Skin is warm, dry and intact. Psychiatric: Mood and affect are normal. Speech and behavior are normal.  ____________________________________________   LABS (all labs ordered are listed, but only abnormal results are displayed)  Labs Reviewed  CBC WITH DIFFERENTIAL/PLATELET - Abnormal; Notable for the following components:      Result Value   Hemoglobin 11.9 (*)    Lymphs Abs 0.4 (*)    All other components within normal limits  CULTURE, BLOOD (ROUTINE X 2)  CULTURE, BLOOD (ROUTINE X 2)  BASIC METABOLIC PANEL  LACTIC ACID, PLASMA  LACTIC ACID, PLASMA  FIBRIN DERIVATIVES D-DIMER ([facility] ONLY)  PROCALCITONIN  LACTATE  DEHYDROGENASE  FERRITIN  TRIGLYCERIDES  FIBRINOGEN  C-REACTIVE PROTEIN  HEPATIC FUNCTION PANEL   ____________________________________________  EKG ED ECG REPORT I, Hinda Kehr, the attending physician, personally viewed and interpreted this ECG.  Date: 11/04/2019 EKG Time: 5:13 AM Rate: 97 Rhythm: normal sinus rhythm QRS Axis: normal Intervals: Right bundle branch block ST/T Wave abnormalities: normal Narrative Interpretation: no evidence of acute ischemia   ____________________________________________  RADIOLOGY I, Hinda Kehr, personally viewed and evaluated these images (plain radiographs) as part of my medical decision making, as well as reviewing the written report by the radiologist.  ED MD interpretation: Bilateral pneumonia, worse than prior.  Official radiology report(s): Dg Chest 1 View  Result Date: 10/15/2019 CLINICAL DATA:  Shortness of breath.  COVID-19 positive EXAM: CHEST  1 VIEW COMPARISON:  09/26/2019 FINDINGS: Bilateral pulmonary infiltrate. Infiltrates were also seen November 2020, but opacity is denser and more widespread today. No edema, effusion, or pneumothorax. Normal heart size. No acute osseous finding. IMPRESSION: Bilateral pneumonia. Electronically Signed   By: Monte Fantasia M.D.   On: 10/22/2019 05:32    ____________________________________________   PROCEDURES   Procedure(s) performed (including Critical Care):  .Critical Care Performed by: Hinda Kehr, MD Authorized by: Hinda Kehr, MD   Critical care provider statement:    Critical care time (minutes):  30   Critical care time was exclusive of:  Separately billable procedures and treating other patients   Critical care was necessary to treat or prevent imminent or life-threatening deterioration of the following conditions:  Respiratory failure (COVID-19)   Critical care was time spent personally by me on the following activities:  Development of treatment plan with patient  or surrogate, discussions with consultants, evaluation of patient's response to treatment, examination of patient, obtaining history from patient or surrogate, ordering and performing treatments and interventions, ordering and review of laboratory studies, ordering and review of radiographic studies, pulse oximetry, re-evaluation of patient's condition and review of old charts     ____________________________________________   [place] / MDM / [place] / ED COURSE  As part of my medical decision making, I reviewed the following data within the [place] [place] notes reviewed and incorporated, Labs reviewed , EKG interpreted , Old chart reviewed, Radiograph reviewed , Discussed with admitting physician  and Notes from prior ED visits   Differential diagnosis includes, but is not limited to, lower respiratory infection secondary to COVID-19 infection, community-acquired pneumonia or healthcare associated pneumonia given her recent hospitalization, sepsis from other sources, pulmonary embolism, ACS.  The patient is reporting no pain, just increased work of breathing or shortness of breath as well as generalized malaise and fatigue.  I reviewed her medical record and saw the recent hospitalization where her symptoms sounded very suspicious for COVID-19 but she had at least 4 negative tests.  However she reportedly, according to the [facility] representative on the phone, had another recent test and the test just came back positive yesterday and they were notified by email.  The patient is in distress but she is feeling better though still tachypneic on oxygen by nasal cannula.  I have turned her down to 3-1/2 L by nasal cannula and she is still relatively comfortable and satting in the low to mid 90s.  We will maintain this for some time and see if this sufficient oxygen support.    I have ordered all the typical COVID-19 relevant labs.  Patient is stable but will  need admission given the oxygen requirement.  I have also ordered Decadron 10 mg IV and ordered remdesivir from the pharmacy.  Received outpatient COVID-19 results notification email from [facility]:       Clinical Course as of Oct 14 1417  Mon Oct 12, 2019  0547 Chest x-ray demonstrates worsening bilateral infiltrates compared to prior.   [CF]  [id number] Consistent with COVID-19 infection.  Fibrinogen(!): 673 [CF]  0547 Normal lactic acid also is suggestive of COVID-19 infection versus bacterial sepsis.  Lactic Acid, Venous: 1.6 [CF]  0548 Normal CBC with no leukocytosis, also consistent with COVID-19.  CBC  with Differential(!) [CF]  0600 Fibrin derivatives D-dimer [facility])(!): >7,500.00 [CF]  0600 I received the positive COVID-19 results email by fax and I took a photo of the results.  The image should appear in the Media tab under Chart Review, and I also put it directly in my note.   [CF]  [phone] Patient dropped to 85% on 3.5 L O2 by [place].  Increasing to 5L [place].  Goodnight for transfer.   [CF]  [phone] LDH(!): 390 [CF]  0645 Discussed case by phone with Dr. Mitzi Hansen at [facility].  He accepted the patient, agrees with my management thus far.  We discussed prophylactic heparin given her very high d-dimer, and given their current algorithm, he recommended bolus + infusion.  Bed placement is pending.   [CF]    Clinical Course User Index [CF] Hinda Kehr, MD     ____________________________________________  FINAL CLINICAL IMPRESSION(S) / ED DIAGNOSES  Final diagnoses:  Lower respiratory tract infection due to COVID-19 virus  Acute respiratory failure with hypoxemia (HCC)  Elevated d-dimer     MEDICATIONS GIVEN DURING THIS VISIT:  Medications  dexamethasone (DECADRON) injection 10 mg (10 mg Intravenous Given 10/25/2019 0543)  remdesivir 200 mg in sodium chloride 0.9% 250 mL IVPB (0 mg Intravenous Stopped 10/27/2019 1038)  heparin injection 4,000 Units (4,000 Units  Intravenous Given 10/07/2019 [id number])     ED Discharge Orders    None      *Please note:  Tulia Baires was evaluated in Emergency Department on 10/14/2019 for the symptoms described in the history of present illness. She was evaluated in the context of the global COVID-19 pandemic, which necessitated consideration that the patient might be at risk for infection with the SARS-CoV-2 virus that causes COVID-19. Institutional protocols and algorithms that pertain to the evaluation of patients at risk for COVID-19 are in a state of rapid change based on information released by regulatory bodies including the CDC and federal and state organizations. These policies and algorithms were followed during the patient's care in the ED.  Some ED evaluations and interventions may be delayed as a result of limited staffing during the pandemic.*  Note:  This document was prepared using Dragon voice recognition software and may include unintentional dictation errors.   Hinda Kehr, MD 10/15/19 [phone]

## 2019-10-12 NOTE — Progress Notes (Signed)
Pt tx from [facility] for [place]. She got the remdesivir started there.   ALT wnl  Continue remdesivir x5d  Onnie Boer, PharmD, [place], AAHIVP, CPP Infectious Disease Pharmacist 10/18/2019 5:59 PM

## 2019-10-12 NOTE — ED Notes (Signed)
Patient to [facility].  During activity, HR increased to 160-180.  HR resolves with rest.  Patient denies complaint during episode.  DOE continues.  Resolves with rest.

## 2019-10-12 NOTE — ED Triage Notes (Signed)
Pt to ED via EMS from [facility], pt c/o shortness of breath. Pt had confirmed positive COVID test yesterday. Pt does not wear o2 at home, Pt arrives 78% on room air. Pt on 5L at this time [place]. Pt denies pain, states has cough. Pt was also dx with pneumonia.

## 2019-10-12 NOTE — ED Notes (Signed)
Butch RN drew first set of cultures

## 2019-10-13 ENCOUNTER — Inpatient Hospital Stay (HOSPITAL_COMMUNITY): Payer: Medicare Other

## 2019-10-13 ENCOUNTER — Other Ambulatory Visit: Payer: Medicare Other

## 2019-10-13 ENCOUNTER — Ambulatory Visit: Payer: Medicare Other | Admitting: Hematology and Oncology

## 2019-10-13 ENCOUNTER — Other Ambulatory Visit: Payer: Self-pay

## 2019-10-13 DIAGNOSIS — I480 Paroxysmal atrial fibrillation: Secondary | ICD-10-CM

## 2019-10-13 DIAGNOSIS — U071 COVID-19: Principal | ICD-10-CM

## 2019-10-13 DIAGNOSIS — L8990 Pressure ulcer of unspecified site, unspecified stage: Secondary | ICD-10-CM | POA: Insufficient documentation

## 2019-10-13 LAB — COMPREHENSIVE METABOLIC PANEL
ALT: 16 U/L (ref 0–44)
AST: 18 U/L (ref 15–41)
Albumin: 2.6 g/dL — ABNORMAL LOW (ref 3.5–5.0)
Alkaline Phosphatase: 70 U/L (ref 38–126)
Anion gap: 11 (ref 5–15)
BUN: 27 mg/dL — ABNORMAL HIGH (ref 8–23)
CO2: 25 mmol/L (ref 22–32)
Calcium: 8.5 mg/dL — ABNORMAL LOW (ref 8.9–10.3)
Chloride: 103 mmol/L (ref 98–111)
Creatinine, Ser: 0.59 mg/dL (ref 0.44–1.00)
GFR calc Af Amer: >60 mL/min (ref >60)
GFR calc non Af Amer: >60 mL/min (ref >60)
Glucose, Bld: 139 mg/dL — ABNORMAL HIGH (ref 70–99)
Potassium: 4.4 mmol/L (ref 3.5–5.1)
Sodium: 139 mmol/L (ref 135–145)
Total Bilirubin: 1 mg/dL (ref 0.3–1.2)
Total Protein: 5.9 g/dL — ABNORMAL LOW (ref 6.5–8.1)

## 2019-10-13 LAB — ECHOCARDIOGRAM COMPLETE
Height: 66 in
Weight: 2244.8 oz

## 2019-10-13 LAB — CBC WITH DIFFERENTIAL/PLATELET
Abs Immature Granulocytes: 0.04 10*3/uL (ref 0.00–0.07)
Basophils Absolute: 0 10*3/uL (ref 0.0–0.1)
Basophils Relative: 0 %
Eosinophils Absolute: 0 10*3/uL (ref 0.0–0.5)
Eosinophils Relative: 0 %
HCT: 33.8 % — ABNORMAL LOW (ref 36.0–46.0)
Hemoglobin: 10.8 g/dL — ABNORMAL LOW (ref 12.0–15.0)
Immature Granulocytes: 1 %
Lymphocytes Relative: 11 %
Lymphs Abs: 0.4 10*3/uL — ABNORMAL LOW (ref 0.7–4.0)
MCH: 28.6 pg (ref 26.0–34.0)
MCHC: 32 g/dL (ref 30.0–36.0)
MCV: 89.7 fL (ref 80.0–100.0)
Monocytes Absolute: 0.1 10*3/uL (ref 0.1–1.0)
Monocytes Relative: 3 %
Neutro Abs: 2.7 10*3/uL (ref 1.7–7.7)
Neutrophils Relative %: 85 %
Platelets: 256 10*3/uL (ref 150–400)
RBC: 3.77 MIL/uL — ABNORMAL LOW (ref 3.87–5.11)
RDW: 12.8 % (ref 11.5–15.5)
WBC: 3.2 10*3/uL — ABNORMAL LOW (ref 4.0–10.5)
nRBC: 0 % (ref 0.0–0.2)

## 2019-10-13 LAB — MRSA PCR SCREENING: MRSA by PCR: NEGATIVE

## 2019-10-13 LAB — TSH: TSH: 1.046 u[IU]/mL (ref 0.350–4.500)

## 2019-10-13 LAB — MAGNESIUM: Magnesium: 2.4 mg/dL (ref 1.7–2.4)

## 2019-10-13 LAB — D-DIMER, QUANTITATIVE: D-Dimer, Quant: 3.55 ug/mL-FEU — ABNORMAL HIGH (ref 0.00–0.50)

## 2019-10-13 LAB — C-REACTIVE PROTEIN: CRP: 8.1 mg/dL — ABNORMAL HIGH (ref <1.0)

## 2019-10-13 LAB — ABO/RH: ABO/RH(D): O POS

## 2019-10-13 LAB — BRAIN NATRIURETIC PEPTIDE: B Natriuretic Peptide: 86.1 pg/mL (ref 0.0–100.0)

## 2019-10-13 MED ORDER — CHLORHEXIDINE GLUCONATE CLOTH 2 % EX PADS
6.0000 | MEDICATED_PAD | Freq: Every day | CUTANEOUS | Status: DC
  Administered 2019-10-14 – 2019-10-22 (×7): 6 via TOPICAL

## 2019-10-13 MED ORDER — DIGOXIN 0.25 MG/ML IJ SOLN
0.1250 mg | Freq: Four times a day (QID) | INTRAMUSCULAR | Status: AC
  Administered 2019-10-13: 0.125 mg via INTRAVENOUS
  Filled 2019-10-13 (×2): qty 0.5

## 2019-10-13 MED ORDER — SODIUM CHLORIDE 0.9 % IV BOLUS
500.0000 mL | Freq: Once | INTRAVENOUS | Status: AC
  Administered 2019-10-13: 500 mL via INTRAVENOUS

## 2019-10-13 MED ORDER — NITROGLYCERIN 0.4 MG SL SUBL
SUBLINGUAL_TABLET | SUBLINGUAL | Status: AC
  Filled 2019-10-13: qty 1

## 2019-10-13 MED ORDER — AMIODARONE HCL IN DEXTROSE 360-4.14 MG/200ML-% IV SOLN
60.0000 mg/h | INTRAVENOUS | Status: AC
  Administered 2019-10-13: 60 mg/h via INTRAVENOUS
  Filled 2019-10-13: qty 200

## 2019-10-13 MED ORDER — METHYLPREDNISOLONE SODIUM SUCC 125 MG IJ SOLR
60.0000 mg | Freq: Two times a day (BID) | INTRAMUSCULAR | Status: DC
  Administered 2019-10-13 (×2): 60 mg via INTRAVENOUS
  Filled 2019-10-13 (×2): qty 2

## 2019-10-13 MED ORDER — METOPROLOL TARTRATE 5 MG/5ML IV SOLN
5.0000 mg | INTRAVENOUS | Status: AC
  Administered 2019-10-13: 5 mg via INTRAVENOUS
  Filled 2019-10-13: qty 5

## 2019-10-13 MED ORDER — AMIODARONE HCL IN DEXTROSE 360-4.14 MG/200ML-% IV SOLN: 30.0000 mg/h | INTRAVENOUS | Status: DC

## 2019-10-13 MED ORDER — DILTIAZEM HCL-DEXTROSE 125-5 MG/125ML-% IV SOLN (PREMIX)
5.0000 mg/h | INTRAVENOUS | Status: DC
  Administered 2019-10-13: 5 mg/h via INTRAVENOUS
  Filled 2019-10-13: qty 125

## 2019-10-13 MED ORDER — ASPIRIN 81 MG PO CHEW
CHEWABLE_TABLET | ORAL | Status: AC
  Administered 2019-10-13: 162 mg
  Filled 2019-10-13: qty 4

## 2019-10-13 MED ORDER — SODIUM CHLORIDE 0.9 % IV SOLN
100.0000 mg | Freq: Once | INTRAVENOUS | Status: AC
  Administered 2019-10-13: 23:00:00 100 mg via INTRAVENOUS
  Filled 2019-10-13: qty 20

## 2019-10-13 MED ORDER — METOPROLOL TARTRATE 5 MG/5ML IV SOLN
5.0000 mg | Freq: Once | INTRAVENOUS | Status: AC
  Administered 2019-10-13: 5 mg via INTRAVENOUS

## 2019-10-13 MED ORDER — DILTIAZEM HCL 60 MG PO TABS
60.0000 mg | ORAL_TABLET | Freq: Two times a day (BID) | ORAL | Status: DC
  Administered 2019-10-13 – 2019-10-16 (×7): 60 mg via ORAL
  Filled 2019-10-13 (×9): qty 1

## 2019-10-13 MED ORDER — NITROGLYCERIN 0.4 MG SL SUBL
0.4000 mg | SUBLINGUAL_TABLET | SUBLINGUAL | Status: AC | PRN
  Administered 2019-10-13 (×3): 0.4 mg via SUBLINGUAL

## 2019-10-13 MED ORDER — MORPHINE SULFATE (PF) 2 MG/ML IV SOLN
2.0000 mg | INTRAVENOUS | Status: AC
  Administered 2019-10-13: 2 mg via INTRAVENOUS
  Filled 2019-10-13: qty 1

## 2019-10-13 MED ORDER — APIXABAN 5 MG PO TABS
5.0000 mg | ORAL_TABLET | Freq: Two times a day (BID) | ORAL | Status: DC
  Administered 2019-10-13 – 2019-10-15 (×5): 5 mg via ORAL
  Filled 2019-10-13 (×5): qty 1

## 2019-10-13 MED ORDER — AMIODARONE HCL 100 MG PO TABS
200.0000 mg | ORAL_TABLET | Freq: Two times a day (BID) | ORAL | Status: DC
  Administered 2019-10-13 – 2019-10-14 (×3): 200 mg via ORAL
  Filled 2019-10-13 (×2): qty 2
  Filled 2019-10-13: qty 1
  Filled 2019-10-13: qty 2

## 2019-10-13 NOTE — Progress Notes (Addendum)
Rapid Response  Nurse called due to patient's heart rate 140s-150s and with complaint of chest pain.  EKG was done and read as STEMI.  On arrival at bedside, SBP was in 220s.  aspirin was already given, nitro sublingual x3 was given with intermittent transitory improvement in chest pain.  IV morphine x1 was given and consults to cardiologist was placed.  I spoke to the daughter to update her about her mother's status. RN was on the phone with cardiologist (Dr. Dione Housekeeper)  while still talking to daughter.  On talking with cardiologist, he recommended Cardizem drip with some IV metoprolol pushes as means of treating patient as paroxysmal A. fib with RVR.  IV metoprolol 5 mg x 1 was given while awaiting Cardizem drip.  Patient was transferred to ICU.  On arrival at ICU, BP was soft (88/63) while HR was still fluctuating within 140s-160s.  Due to patient's soft BP, it was decided to start patient on amiodarone drip with plan to possibly transition patient to oral meds once heart rate is better controlled.  Of note, patient's chest pain has since resolved, she was alert and oriented x3.  IV NS 500 mL bolus was given.  Assessment and plan Paroxysmal A. fib with RVR Continue IV amiodarone drip at this time with plan to transition to oral meds once HR is better controlled.  Total time spent: 50 mins This includes patient evaluation at bedside, making of clinical decision, updating patient's daughter of patient's condition as well as plan of care, consulting with cardiologist, review of patient's chart and making medical decisions.

## 2019-10-13 NOTE — Progress Notes (Signed)
PROGRESS NOTE                                                                                                                                                                                                             Patient Demographics:    Elaine Price, is a 84 y.o. female, DOB - January 06, 1935, [id number]  Outpatient Primary MD for the patient is McLean-Scocuzza, Nino Glow, MD    LOS - 1  Admit date - 11/02/2019    CC - SOB     Brief Narrative  -  Elaine Price  is a 84 y.o. female, with history significant for breast cancer, essential hypertension, dyslipidemia, anemia of chronic disease, diverticulosis without diverticulitis, recent admission for pneumonia.  Patient otherwise is quite healthy and independent still lives alone and drives, has been sick for around a month now.  Was recently admitted for pneumonia to [facility] hospital and discharged few weeks ago, she was diagnosed recently in the outpatient setting with COVID-19 infection 2 days prior to her ER visit, her daughter also tested positive.  In the [place] ER she was diagnosed with acute hypoxic respiratory failure due to COVID-19 pneumonitis, she was placed on a nonrebreather mask, she was started on IV steroids and remdesivir and transferred here to [facility] for further care.  Patient currently has only shortness of breath and a dry cough as her complaints, denies any headache, no chest or abdominal pain, no productive phlegm, is short of breath, shortness of breath is worse with exertion better with rest, no other subjective complaints.    Subjective:    Elaine Price today has, No headache, No chest pain, No abdominal pain - No Nausea, No new weakness tingling or numbness, improved Cough & SOB.    Assessment  & Plan :     1. Acute Hypoxic Resp. Failure due to Acute Covid 19 Viral Pneumonitis during the ongoing 2020 Covid 19 Pandemic - She had severe  Hypoxia upon arrival from [facility], she was on nonrebreather.  She received IV steroids and remdesivir at [facility] for the first day of her hospital stay, she was getting worse rapidly.  Upon arrival she received IV Actemra and since then she has shown remarkable improvement.  She is now between 6 and 8 L high flow nasal cannula.  Will continue to titrate oxygen down,  continue IV steroids and remdesivir and monitor her clinically.   Encouraged the patient to sit up in chair in the daytime use I-S and flutter valve for pulmonary toiletry and then prone in bed when at night.     SpO2: 95 % O2 Flow Rate (L/min): 10 L/min FiO2 (%): 100 %  Recent Labs  Lab 10/14/2019 0513 10/13/19 0320  CRP 8.7* 8.1*  DDIMER  --  3.55*  FERRITIN 481*  --   BNP  --  86.1  PROCALCITON <0.10  --     Hepatic Function Latest Ref Rng & Units 10/13/2019 10/06/2019 09/30/2019  Total Protein 6.5 - 8.1 g/dL 5.9(L) 6.9 5.9(L)  Albumin 3.5 - 5.0 g/dL 2.6(L) 3.2(L) 2.6(L)  AST 15 - 41 U/L 18 31 14(L)  ALT 0 - 44 U/L _0 Alk Phosphatase 38 - 126 U/L 70 75 66  Total Bilirubin 0.3 - 1.2 mg/dL 1.0 0.6 0.6  Bilirubin, Direct 0.0 - 0.2 mg/dL - 0.2 -    FiO2 (%):  [100 %] 100 %  ABG  No results found for: PHART, PCO2ART, PO2ART, HCO3, TCO2, ACIDBASEDEF, O2SAT   2.  Elevated D-dimer due to inflammation.  Currently placed on Eliquis.  CTA negative.  3.  New onset A. fib RVR.  Due to pulmonary stress.  Stable TSH, initially was hypotensive hence IV amiodarone was needed, also received 2 doses of IV digoxin, IV fluids given as well.  Blood pressure has improved.  Will be placed on oral amiodarone along with Cardizem.  Mali vas 2 score is at least 3 hence Eliquis started.  Echo ordered.  4.  GERD.  On PPI.     Condition -  Guarded  Family Communication  : daughter updated 10/13/2019  Code Status :  DNR  Diet :    Diet Order            DIET SOFT Room service appropriate? Yes; Fluid  consistency: Thin  Diet effective now               Disposition Plan  : ICU >> PCU  Consults  :    Procedures  :     CTA - No PE  TTE   PUD Prophylaxis : PPI  DVT Prophylaxis  :  Lovenox >> Eliquis  Lab Results  Component Value Date   PLT 256 10/13/2019    Inpatient Medications  Scheduled Meds:  amiodarone  200 mg Oral BID   Chlorhexidine Gluconate Cloth  6 each Topical Daily   dextromethorphan-guaiFENesin  2 tablet Oral Q12H   digoxin  0.125 mg Intravenous Q6H   mouth rinse  15 mL Mouth Rinse BID   methylPREDNISolone (SOLU-MEDROL) injection  60 mg Intravenous Q12H   pantoprazole  40 mg Oral Daily   sodium chloride flush  3 mL Intravenous Q12H   traZODone  25 mg Oral QHS   Continuous Infusions:  amiodarone Stopped (10/13/19 0705)   remdesivir 100 mg in NS 100 mL     PRN Meds:.acetaminophen, albuterol, bisacodyl, ondansetron **OR** ondansetron (ZOFRAN) IV  Antibiotics  :    Anti-infectives (From admission, onward)   Start     Dose/Rate Route Frequency Ordered Stop   10/13/19 1000  remdesivir 100 mg in sodium chloride 0.9 % 100 mL IVPB  Status:  Discontinued     100 mg 200 mL/hr over 30 Minutes Intravenous Daily 10/30/2019 1631 10/17/2019 1641   10/13/19 0600  remdesivir 100 mg in sodium chloride 0.9 %  100 mL IVPB     100 mg 200 mL/hr over 30 Minutes Intravenous Daily 10/21/2019 1643 10/17/19 0959   10/21/2019 1645  remdesivir 200 mg in sodium chloride 0.9% 250 mL IVPB  Status:  Discontinued     200 mg 580 mL/hr over 30 Minutes Intravenous Once 11/01/2019 1631 10/29/2019 1641       Time Spent in minutes  30   Lala Lund M.D on 10/13/2019 at 11:05 AM  To page go to [email or web address] - password [facility]  [facility] -  Office  [phone]  See all Orders from today for further details    Objective:   Vitals:   10/13/19 1000 10/13/19 1015 10/13/19 1030 10/13/19 1045  BP: (!) 100/45 (!) 100/46 (!) 104/53 (!) 101/48  Pulse: 70 65 (!) 59 62    Resp: 16 20 (!) 22 19  Temp:      TempSrc:      SpO2: 100% 93% 100% 95%  Weight:      Height:        Wt Readings from Last 3 Encounters:  10/13/19 63.6 kg  10/29/2019 68.2 kg  10/06/19 68.2 kg     Intake/Output Summary (Last 24 hours) at 10/13/2019 1105 Last data filed at 10/13/2019 0300 Gross per 24 hour  Intake 103 ml  Output --  Net 103 ml     Physical Exam  Awake Alert,  No new F.N deficits, Normal affect [place].AT,PERRAL Supple Neck,No JVD, No cervical lymphadenopathy appriciated.  Symmetrical Chest wall movement, Good air movement bilaterally, CTAB iRRR,No Gallops,Rubs or new Murmurs, No Parasternal Heave +ve B.Sounds, Abd Soft, No tenderness, No organomegaly appriciated, No rebound - guarding or rigidity. No Cyanosis, Clubbing or edema, No new Rash or bruise     Data Review:    CBC Recent Labs  Lab 10/19/2019 0513 10/13/19 0320  WBC 7.2 3.2*  HGB 11.9* 10.8*  HCT 36.7 33.8*  PLT 264 256  MCV 88.2 89.7  MCH 28.6 28.6  MCHC 32.4 32.0  RDW 12.9 12.8  LYMPHSABS 0.4* 0.4*  MONOABS 0.2 0.1  EOSABS 0.0 0.0  BASOSABS 0.0 0.0    Chemistries  Recent Labs  Lab 10/16/2019 0513 10/13/19 0320  NA 137 139  K 4.0 4.4  CL 100 103  CO2 23 25  GLUCOSE 94 139*  BUN 21 27*  CREATININE 0.68 0.59  CALCIUM 8.7* 8.5*  MG  --  2.4  AST 31 18  ALT 18 16  ALKPHOS 75 70  BILITOT 0.6 1.0   ------------------------------------------------------------------------------------------------------------------ Recent Labs    10/18/2019 0513  TRIG 115    Lab Results  Component Value Date   HGBA1C 5.9 09/11/2019   ------------------------------------------------------------------------------------------------------------------ Recent Labs    10/13/19 0320  TSH 1.046    Cardiac Enzymes No results for input(s): CKMB, TROPONINI, MYOGLOBIN in the last 168 hours.  Invalid input(s):  CK ------------------------------------------------------------------------------------------------------------------    Component Value Date/Time   BNP 86.1 10/13/2019 0320    Micro Results Recent Results (from the past 240 hour(s))  Blood Culture (routine x 2)     Status: None (Preliminary result)   Collection Time: 10/30/2019  5:29 AM   Specimen: BLOOD  Result Value Ref Range Status   Specimen Description BLOOD LEFT AC  Final   Special Requests   Final    BOTTLES DRAWN AEROBIC AND ANAEROBIC Blood Culture adequate volume   Culture   Final    NO GROWTH 1 DAY Performed at [facility], [place],  [place], [place] [address]    Report Status PENDING  Incomplete  Blood Culture (routine x 2)     Status: None (Preliminary result)   Collection Time: 11/02/2019  5:29 AM   Specimen: BLOOD  Result Value Ref Range Status   Specimen Description BLOOD RIGHT WRIST  Final   Special Requests   Final    BOTTLES DRAWN AEROBIC ONLY Blood Culture results may not be optimal due to an inadequate volume of blood received in culture bottles   Culture   Final    NO GROWTH 1 DAY Performed at [facility], [address] [place], [place] [address]    Report Status PENDING  Incomplete  MRSA PCR Screening     Status: None   Collection Time: 10/13/19  5:40 AM   Specimen: Nasopharyngeal  Result Value Ref Range Status   MRSA by PCR NEGATIVE NEGATIVE Final    Comment:        The GeneXpert MRSA Assay (FDA approved for NASAL specimens only), is one component of a comprehensive MRSA colonization surveillance program. It is not intended to diagnose MRSA infection nor to guide or monitor treatment for MRSA infections. Performed at [facility], [place] [address] [place], [place] [address]     Radiology Reports Dg Chest 1 View  Result Date: 10/24/2019 CLINICAL DATA:  Shortness of breath.  COVID-19 positive EXAM: CHEST  1 VIEW COMPARISON:  09/26/2019 FINDINGS:  Bilateral pulmonary infiltrate. Infiltrates were also seen November 2020, but opacity is denser and more widespread today. No edema, effusion, or pneumothorax. Normal heart size. No acute osseous finding. IMPRESSION: Bilateral pneumonia. Electronically Signed   By: Monte Fantasia M.D.   On: 10/09/2019 05:32   Ct Angio Chest Pe W Or Wo Contrast  Result Date: 09/27/2019 CLINICAL DATA:  Multiple falls and feeling fatigued. PE suspected. EXAM: CT ANGIOGRAPHY CHEST WITH CONTRAST TECHNIQUE: Multidetector CT imaging of the chest was performed using the standard protocol during bolus administration of intravenous contrast. Multiplanar CT image reconstructions and MIPs were obtained to evaluate the vascular anatomy. CONTRAST:  53m OMNIPAQUE IOHEXOL 350 MG/ML SOLN COMPARISON:  None. FINDINGS: Cardiovascular: Heart is enlarged. No pericardial effusion. Coronary artery calcification is evident. Atherosclerotic calcification is noted in the wall of the thoracic aorta. No filling defect within the opacified pulmonary arteries to suggest the presence of an acute pulmonary embolus. Mediastinum/Nodes: Mediastinal lymph nodes measure up to 8 mm short axis in the pre-vascular window. 9 mm short axis subcarinal lymph node. The esophagus has normal imaging features. Surgical clips are noted in the left axilla. Lungs/Pleura: Lungs demonstrate bilateral patchy ground-glass infiltrates peripherally in both lungs with an upper lung predominance. No pleural effusion. Upper Abdomen: 2.6 cm low-density lesion in the left liver approaches water attenuation and is compatible with a cyst. Musculoskeletal: No worrisome lytic or sclerotic osseous abnormality. Review of the MIP images confirms the above findings. IMPRESSION: 1. No CT evidence for acute pulmonary embolus. 2. Bilateral patchy ground-glass infiltrates peripherally in both lungs with an upper lung predominance. Imaging features are nonspecific but likely related to  infectious/inflammatory etiology and atypical or viral pneumonia would be a consideration. 3. Borderline mediastinal lymphadenopathy, likely reactive. 4. Aortic Atherosclerosis (ICD10-I70.0). Electronically Signed   By: EMisty StanleyM.D.   On: 09/27/2019 07:35   Dg Chest Port 1 View  Result Date: 09/26/2019 CLINICAL DATA:  Low-grade fever. EXAM: PORTABLE CHEST 1 VIEW COMPARISON:  10/24/2016 FINDINGS: 1300 hours. The cardiopericardial silhouette is within normal limits for  size. Interstitial markings are diffusely coarsened with chronic features. Patchy subtle airspace opacities are seen peripherally in both lungs. No associated pleural effusion period bones are demineralized. IMPRESSION: 1. Patchy subtle airspace opacities in the periphery of both lungs, left greater than right, without pleural effusion. Imaging features compatible with multifocal pneumonia and atypical/viral etiology should be considered. I discussed these findings by telephone with Dr. Jacqualine Code at approximately 13 20 on 09/26/2019. Electronically Signed   By: Misty Stanley M.D.   On: 09/26/2019 13:23

## 2019-10-13 NOTE — Progress Notes (Signed)
Pt arrived to 1E from ICU @ 1745. VSS.

## 2019-10-13 NOTE — Progress Notes (Signed)
Pt brought to ICU on NRB and sats were 94% when transferred to wall flow meter from tank.  Pt in no respiratory distress at this time.  [place] at bedside if needed.

## 2019-10-13 NOTE — Progress Notes (Signed)
ANTICOAGULATION CONSULT NOTE - Initial Consult  Pharmacy Consult for apixaban Indication: atrial fibrillation  Patient Measurements: Height: 5\' 6"  (167.6 cm) Weight: 140 lb 4.8 oz (63.6 kg) IBW/kg (Calculated) : 59.3  Vital Signs: Temp: 98.1 F (36.7 C) (12/08 0815) Temp Source: Oral (12/08 0815) BP: 101/48 (12/08 1045) Pulse Rate: 62 (12/08 1045)  Labs: Recent Labs    11/01/2019 0513 10/13/19 0320  HGB 11.9* 10.8*  HCT 36.7 33.8*  PLT 264 256  APTT <24*  --   LABPROT 12.6  --   INR 1.0  --   CREATININE 0.68 0.59    Medical History: Past Medical History:  Diagnosis Date  . Anemia    PMH  . Arthritis   . Cancer [facility])    left breast cancer and right abnormal mammogram  . Depression    since death of husband in Feb 02, 2014   . Diverticulosis   . Essential hypertension   . Gallstones   . Gastritis and duodenitis    dounf by CT Nov 2012  . Hyperlipemia   . Pneumonia    hx of  . Seasonal allergies   . Wears glasses      Assessment: 84 yo female with new onset AFib. Due to CHADSVASc score, will initiate Eliquis. Age > 80, Weight just above 60 kg and SCr wnl; therefore will begin apixaban 5 mg po bid.h/h plts wnl.   Goal of Therapy:  Monitor platelets by anticoagulation protocol: Yes    Plan:  -Eliquis 5 mg po bid -Monitor weight, may require dose reduction   Raegan Winders, Jake Church 10/13/2019,11:54 AM

## 2019-10-13 NOTE — Progress Notes (Addendum)
While placing a foley and flexiseal in another patient's room, telemetry tech called to say that the O2 in 118 had dropped to 70s and HR had increased to 160s. Coralyn Mark, RN who was assisting me quickly went to room 118 and found that patient had taken NRB off and HR went down. After finishing up in room 116, Coralyn Mark had been to check on this patient again for me. She reported to me that the HR was sustaining between 140s and 160s. I went into room to assess patient. Patient was also c/o chest pain that she thought felt like heart burn. Called doctor on call and called RRT. EKG completed at 0430 which showed STEMI. RR RN looked up protocol for STEMI before MD arrived. Got nitro out of pyxis but waited on MD to arrive. Gave aspirin per protocol while waiting on MD. MD arrived and assessed patient and VSs. Gave 0.4 mg SL nitro at 0440 per MD. Patient could not put an actual number on the chest pain. She stated that it went away after nitro and then came back. Gave 0.4mg  SL nitro at 0445 per MD. Patient stated the same response. Gave 0.4mg  SL nitro at 0450 per MD. MD is now on phone with daughter, Colletta [place] to let her know what was going on and what the plan for her Mom was. She appreciated the phone call. MD stated to give 2mg  IV morphine at 0453. Gave to patient and she stated that it also helped her chest pain but the pain still came back. RR RN consulted cardiologist. Cardiologist stated to start a cardizem drip. Hospitalist stated that since cardizem was not here from pharmacy to give 5mg  IV metoprolol. Gave 5mg  metoprolol IV at 0453. At this time while waiting on cardizem, moved patient to ICU per MD. Hanley Seamen report to [facility], ICU RN, at bedside.   Danae Chen, charge RN, and [place], [facility], have been made aware of situation.

## 2019-10-13 NOTE — Progress Notes (Signed)
Pt Cardizem gtt discontinued due to low BP Amio gtt started. And 500cc bolus given. BP still remains low. MD paged Ordered to turn amio rate to 30mg /hr, give a 1000 cc bolus, 60mg  solumedrol and 0.125mg  of digoxin  Pt remains Alert and Oriented X4. O2 sats stable on NRB HR [id number] BP 0000000 systolic MAP 61

## 2019-10-13 NOTE — Progress Notes (Signed)
Echocardiogram 2D Echocardiogram has been performed.  Oneal Deputy Kadyn Chovan 10/13/2019, 11:30 AM

## 2019-10-13 NOTE — Progress Notes (Signed)
Rapid Response Event Note  Overview: Patient with chest pain and rapid heart rate. Transferred to ICU.       Initial Focused Assessment: Chest pain unrated, decribed as heart burn. HR in the 160s to 180s. 12 lead gave critical result STEMI message on screen. Patient on heparin drip with 2 iv sites both patent.   Interventions: Aspirin given. After arrival of MD Nitro x3  and morphine given. Moderate relief from nitro but pain returned. HR continued to be above 150. Cardiology called. Advised to start cardizem and retake 12 lead when heart rate slowed.   Plan of Care: transferred to ICU for cardizem drip, frequent vitals monitoring   Paulina Fusi

## 2019-10-13 NOTE — Progress Notes (Signed)
OT Cancellation Note  Patient Details Name: Elaine Price MRN: [id number] DOB: June 23, 1935   Cancelled Treatment:    Reason Eval/Treat Not Completed: Patient not medically ready;Medical issues which prohibited therapy (Pt with STEMI, Afib. Will return as schedule allows and pt medically stable.)  [place], OTR/L Acute Rehab Pager: [phone] Office: [phone] 10/13/2019, 7:12 AM

## 2019-10-13 NOTE — Progress Notes (Signed)
Patient's HR sustaining in 140s-t60s & patient c/o chest pain. Paged doctor.

## 2019-10-14 LAB — CBC WITH DIFFERENTIAL/PLATELET
Abs Immature Granulocytes: 0.07 K/uL (ref 0.00–0.07)
Basophils Absolute: 0 K/uL (ref 0.0–0.1)
Basophils Relative: 0 %
Eosinophils Absolute: 0 K/uL (ref 0.0–0.5)
Eosinophils Relative: 0 %
HCT: 36.7 % (ref 36.0–46.0)
Hemoglobin: 11.3 g/dL — ABNORMAL LOW (ref 12.0–15.0)
Immature Granulocytes: 1 %
Lymphocytes Relative: 5 %
Lymphs Abs: 0.4 K/uL — ABNORMAL LOW (ref 0.7–4.0)
MCH: 28.5 pg (ref 26.0–34.0)
MCHC: 30.8 g/dL (ref 30.0–36.0)
MCV: 92.4 fL (ref 80.0–100.0)
Monocytes Absolute: 0.1 K/uL (ref 0.1–1.0)
Monocytes Relative: 1 %
Neutro Abs: 7.1 K/uL (ref 1.7–7.7)
Neutrophils Relative %: 93 %
Platelets: 219 K/uL (ref 150–400)
RBC: 3.97 MIL/uL (ref 3.87–5.11)
RDW: 12.9 % (ref 11.5–15.5)
WBC: 7.6 K/uL (ref 4.0–10.5)
nRBC: 0 % (ref 0.0–0.2)

## 2019-10-14 LAB — COMPREHENSIVE METABOLIC PANEL
ALT: 15 U/L (ref 0–44)
AST: 20 U/L (ref 15–41)
Albumin: 2.8 g/dL — ABNORMAL LOW (ref 3.5–5.0)
Alkaline Phosphatase: 76 U/L (ref 38–126)
Anion gap: 9 (ref 5–15)
BUN: 33 mg/dL — ABNORMAL HIGH (ref 8–23)
CO2: 26 mmol/L (ref 22–32)
Calcium: 8.7 mg/dL — ABNORMAL LOW (ref 8.9–10.3)
Chloride: 104 mmol/L (ref 98–111)
Creatinine, Ser: 0.69 mg/dL (ref 0.44–1.00)
GFR calc Af Amer: >60 mL/min (ref >60)
GFR calc non Af Amer: >60 mL/min (ref >60)
Glucose, Bld: 145 mg/dL — ABNORMAL HIGH (ref 70–99)
Potassium: 4.7 mmol/L (ref 3.5–5.1)
Sodium: 139 mmol/L (ref 135–145)
Total Bilirubin: 0.5 mg/dL (ref 0.3–1.2)
Total Protein: 5.9 g/dL — ABNORMAL LOW (ref 6.5–8.1)

## 2019-10-14 LAB — MAGNESIUM: Magnesium: 2.3 mg/dL (ref 1.7–2.4)

## 2019-10-14 LAB — BRAIN NATRIURETIC PEPTIDE: B Natriuretic Peptide: 224.8 pg/mL — ABNORMAL HIGH (ref 0.0–100.0)

## 2019-10-14 LAB — HEPATITIS B SURFACE ANTIBODY, QUANTITATIVE: Hep B S AB Quant (Post): <3.1 m[IU]/mL — ABNORMAL LOW (ref >9.9)

## 2019-10-14 LAB — C-REACTIVE PROTEIN: CRP: 3.8 mg/dL — ABNORMAL HIGH

## 2019-10-14 LAB — D-DIMER, QUANTITATIVE: D-Dimer, Quant: 6.37 ug{FEU}/mL — ABNORMAL HIGH (ref 0.00–0.50)

## 2019-10-14 MED ORDER — AMIODARONE HCL 100 MG PO TABS
100.0000 mg | ORAL_TABLET | Freq: Every day | ORAL | Status: DC
  Administered 2019-10-15 – 2019-10-19 (×5): 100 mg via ORAL
  Filled 2019-10-14 (×8): qty 1

## 2019-10-14 MED ORDER — FUROSEMIDE 10 MG/ML IJ SOLN
40.0000 mg | Freq: Once | INTRAMUSCULAR | Status: AC
  Administered 2019-10-14: 40 mg via INTRAVENOUS
  Filled 2019-10-14: qty 4

## 2019-10-14 MED ORDER — FUROSEMIDE 10 MG/ML IJ SOLN: 20.0000 mg | Freq: Once | INTRAMUSCULAR | Status: DC

## 2019-10-14 MED ORDER — ALPRAZOLAM 0.5 MG PO TABS
0.2500 mg | ORAL_TABLET | Freq: Two times a day (BID) | ORAL | Status: DC | PRN
  Administered 2019-10-14: 0.25 mg via ORAL
  Filled 2019-10-14: qty 1

## 2019-10-14 MED ORDER — SALINE SPRAY 0.65 % NA SOLN
1.0000 | NASAL | Status: DC | PRN
  Administered 2019-10-14 – 2019-10-16 (×2): 1 via NASAL
  Filled 2019-10-14: qty 44

## 2019-10-14 MED ORDER — METHYLPREDNISOLONE SODIUM SUCC 40 MG IJ SOLR
40.0000 mg | Freq: Two times a day (BID) | INTRAMUSCULAR | Status: DC
  Administered 2019-10-14 – 2019-10-16 (×4): 40 mg via INTRAVENOUS
  Filled 2019-10-14 (×5): qty 1

## 2019-10-14 NOTE — Progress Notes (Signed)
A/O, no noted distress. Educated on usage of incentive. Pt slept well.

## 2019-10-14 NOTE — Plan of Care (Signed)
  Problem: Education: Goal: Knowledge of risk factors and measures for prevention of condition will improve Outcome: Progressing   Problem: Respiratory: Goal: Will maintain a patent airway Outcome: Progressing Goal: Complications related to the disease process, condition or treatment will be avoided or minimized Outcome: Progressing   Problem: Coping: Goal: Psychosocial and spiritual needs will be supported Outcome: Progressing   

## 2019-10-14 NOTE — Evaluation (Signed)
Occupational Therapy Evaluation Patient Details Name: Elaine Price MRN: [id number] DOB: 1935-10-21 Today's Date: 10/14/2019    History of Present Illness 84 y.o. female presenting with ED with SOB. Tested COVID +.   PMH including breast cancer, essential HTN, dyslipidemia, anemia of chronic disease, diverticulosis without diverticulitis, and recent admission for pneumonia.   Clinical Impression   PTA, pt was living at an Independent level community, [facility], and was independent performing ADLs, IADLs, and driving. Pt currently requiring Min A for ADLs and for functional transfers with RW. Pt presenting with poor activity tolerance as seen by SOB and SpO2 as low as 66% on 15L via HFNC. Pt SpO2 flucuating between 86-77% on 10L via HFNC while supine and seated at EOB. Pt requiring 15L for safe transfer to recliner; SpO2 dropping to 66% and required seated rest break, cues for purse lip breathing, and 15L to return to 80s. Throughout session, pt requiring cues for purse lip breathing as she has a tendency for mouth breathing. Also providing education on flutter valve and pt performing 10 reps (twice one in bed and then again after transfer to recliner). Pt would benefit from further acute OT to facilitate safe dc. Recommend dc to SNF for further OT to optimize safety, independence with ADLs, and return to PLOF.      Follow Up Recommendations  SNF;Supervision/Assistance - 24 hour(Pending progress)    Equipment Recommendations  Other (comment)(Defer to next venue)    Recommendations for Other Services PT consult     Precautions / Restrictions Precautions Precautions: Other (comment) Precaution Comments: Watch SpO2      Mobility Bed Mobility Overal bed mobility: Needs Assistance Bed Mobility: Supine to Sit     Supine to sit: Supervision;HOB elevated     General bed mobility comments: Supervision for safety  Transfers Overall transfer level: Needs assistance Equipment  used: Rolling walker (2 wheeled) Transfers: Sit to/from [facility] Sit to Stand: Min guard Stand pivot transfers: Min assist       General transfer comment: Min Guard A for safety with standing. Min A for balance during pivot to recliner    Balance Overall balance assessment: Needs assistance Sitting-balance support: No upper extremity supported;Feet supported Sitting balance-Leahy Scale: Fair     Standing balance support: Bilateral upper extremity supported;During functional activity Standing balance-Leahy Scale: Poor Standing balance comment: Reliant on physical A and UE support                           ADL either performed or assessed with clinical judgement   ADL Overall ADL's : Needs assistance/impaired Eating/Feeding: Set up;Sitting   Grooming: Set up;Sitting;Brushing hair   Upper Body Bathing: Minimal assistance;Sitting   Lower Body Bathing: Minimal assistance;Sit to/from stand   Upper Body Dressing : Minimal assistance;Sitting Upper Body Dressing Details (indicate cue type and reason): Pt donned her night robe while seated EOB with Min A  Lower Body Dressing: Minimal assistance;Sit to/from stand Lower Body Dressing Details (indicate cue type and reason): Pt requiring Min A to don underwear over feet. Pt then able to pull underwear up over hips. Min Guard -Min A for standing Training and development officer: Minimal assistance;Stand-pivot;RW(stand pivot) Armed forces technical officer Details (indicate cue type and reason): Min A for balance and safety         Functional mobility during ADLs: Minimal assistance;Rolling walker(stand pivot only) General ADL Comments: Pt presenting with decreased activity tolerance as seen  by SpO2 dropping as  low as 66% on 15L O2 with activity. Pt able to elevate SpO2 back to 80s with significant seated rest break, cues for purse lip breathing, and 15L O2,. Also using flutter valve to assist with controlled breathing after transfer      Vision Baseline Vision/History: Wears glasses(She doesn't know where glasses are) Wears Glasses: At all times Patient Visual Report: No change from baseline       Perception     Praxis      Pertinent Vitals/Pain Pain Assessment: No/denies pain     Hand Dominance Right   Extremity/Trunk Assessment Upper Extremity Assessment Upper Extremity Assessment: Generalized weakness   Lower Extremity Assessment Lower Extremity Assessment: Defer to PT evaluation   Cervical / Trunk Assessment Cervical / Trunk Assessment: Normal   Communication Communication Communication: No difficulties   Cognition Arousal/Alertness: Awake/alert Behavior During Therapy: WFL for tasks assessed/performed Overall Cognitive Status: Within Functional Limits for tasks assessed                                 General Comments: Very motivated to participate in therapy. Pt requiring increased time at times possibly due to fatigue or decreased processing.    General Comments  HR 70-90s. RR 20s. During transfer, RR elevated to 44 at highest. SpO2 flucuating between 77-86% on 10L while supine and seated at EOB. Once performing transfer, pt SpO2 dropping to 66% on 15L via HFNC.     Exercises     Shoulder Instructions      Home Living Family/patient expects to be discharged to:: Private residence Living Arrangements: Alone Available Help at Discharge: Available PRN/intermittently Type of Home: Apartment Home Access: Level entry     Home Layout: One level     Bathroom Shower/Tub: Occupational psychologist: Handicapped height     Home Equipment: Environmental consultant - 2 wheels;Cane - single point          Prior Functioning/Environment Level of Independence: Independent        Comments: ADLs, IADLs, driving        OT Problem List: Decreased strength;Decreased range of motion;Decreased activity tolerance;Impaired balance (sitting and/or standing);Decreased knowledge of use of  DME or AE;Decreased knowledge of precautions      OT Treatment/Interventions: Self-care/ADL training;Therapeutic exercise;Energy conservation;DME and/or AE instruction;Therapeutic activities;Patient/family education    OT Goals(Current goals can be found in the care plan section) Acute Rehab OT Goals Patient Stated Goal: Get rid of this and go home OT Goal Formulation: With patient Time For Goal Achievement: 10/28/19 Potential to Achieve Goals: Good  OT Frequency: Min 2X/week   Barriers to D/C:            Co-evaluation PT/OT/SLP Co-Evaluation/Treatment: Yes Reason for Co-Treatment: For patient/therapist safety;To address functional/ADL transfers;Other (comment)(Activity tolerance)   OT goals addressed during session: ADL's and self-care      AM-PAC OT "6 Clicks" Daily Activity     Outcome Measure Help from another person eating meals?: None Help from another person taking care of personal grooming?: A Little Help from another person toileting, which includes using toliet, bedpan, or urinal?: A Little Help from another person bathing (including washing, rinsing, drying)?: A Little Help from another person to put on and taking off regular upper body clothing?: A Little Help from another person to put on and taking off regular lower body clothing?: A Little 6 Click Score: 19   End of Session Equipment Utilized During  Treatment: Rolling walker;Oxygen(10-15L) Nurse Communication: Mobility status  Activity Tolerance: Patient tolerated treatment well Patient left: in chair;with call bell/phone within reach;with chair alarm set  OT Visit Diagnosis: Unsteadiness on feet (R26.81);Other abnormalities of gait and mobility (R26.89);Muscle weakness (generalized) (M62.81)                Time: [id number] OT Time Calculation (min): 36 min Charges:  OT General Charges $OT Visit: 1 Visit OT Evaluation $OT Eval Moderate Complexity: [place], OTR/L Acute Rehab Pager:  [phone] Office: [place] 10/14/2019, 10:43 AM

## 2019-10-14 NOTE — Evaluation (Signed)
Physical Therapy Evaluation Patient Details Name: Elaine Price MRN: [id number] DOB: 1935/08/08 Today's Date: 10/14/2019   History of Present Illness  84 y.o. female presenting with ED with SOB. Tested COVID +.   PMH including breast cancer, essential HTN, dyslipidemia, anemia of chronic disease, diverticulosis without diverticulitis, and recent admission for pneumonia.  Clinical Impression  The patient is very weak, received on 8 L, SPO2 as low as 69 % when mobilizing. Increased to 15 L HFNC for transfers with SPO2 fluctuating  77-86%. RR required 15 L HFNC to keep SPO2 Up in 70-80's . Patient recovered to 90% on 15 l. Remained on 15 L until RN will turn down. Pt admitted with above diagnosis.  Pt currently with functional limitations due to the deficits listed below (see PT Problem List). Pt will benefit from skilled PT to increase their independence and safety with mobility to allow discharge to the venue listed below.       Follow Up Recommendations SNF;Supervision/Assistance - 24 hour    Equipment Recommendations  None recommended by PT    Recommendations for Other Services       Precautions / Restrictions Precautions Precaution Comments: Watch SpO2, Heart      Mobility  Bed Mobility Overal bed mobility: Needs Assistance Bed Mobility: Supine to Sit     Supine to sit: Supervision;HOB elevated     General bed mobility comments: Supervision for safety  Transfers Overall transfer level: Needs assistance Equipment used: Rolling walker (2 wheeled) Transfers: Sit to/from [facility] Sit to Stand: Min guard Stand pivot transfers: Min assist       General transfer comment: Min Guard A for safety with standing. Min A for balance during pivot to recliner, patient on 8 then 15 L HFNC to keep sats up.  Ambulation/Gait                Stairs            Wheelchair Mobility    Modified Rankin (Stroke Patients Only)       Balance  Overall balance assessment: Needs assistance Sitting-balance support: No upper extremity supported;Feet supported Sitting balance-Leahy Scale: Fair     Standing balance support: Bilateral upper extremity supported;During functional activity Standing balance-Leahy Scale: Poor Standing balance comment: Reliant on physical A and UE support                             Pertinent Vitals/Pain Pain Assessment: No/denies pain    Home Living Family/patient expects to be discharged to:: Private residence Living Arrangements: Alone Available Help at Discharge: Available PRN/intermittently Type of Home: Apartment(modular at [facility]) Home Access: Level entry     Home Layout: One level Home Equipment: Environmental consultant - 2 wheels;Cane - single point      Prior Function Level of Independence: Independent         Comments: ADLs, IADLs, driving     Hand Dominance   Dominant Hand: Right    Extremity/Trunk Assessment        Lower Extremity Assessment Lower Extremity Assessment: Generalized weakness    Cervical / Trunk Assessment Cervical / Trunk Assessment: Normal  Communication   Communication: No difficulties  Cognition Arousal/Alertness: Awake/alert Behavior During Therapy: WFL for tasks assessed/performed;Anxious Overall Cognitive Status: Within Functional Limits for tasks assessed  General Comments: Very motivated to participate in therapy. Pt requiring increased time at times possibly due to fatigue or decreased processing.       General Comments      Exercises     Assessment/Plan    PT Assessment Patient needs continued PT services  PT Problem List Decreased strength;Decreased activity tolerance;Decreased balance;Decreased safety awareness;Decreased knowledge of use of DME;Decreased cognition       PT Treatment Interventions DME instruction;Gait training;Stair training;Functional mobility training;Therapeutic  activities;Therapeutic exercise;Balance training;Neuromuscular re-education;Patient/family education    PT Goals (Current goals can be found in the Care Plan section)  Acute Rehab PT Goals Patient Stated Goal: Get rid of this and go home PT Goal Formulation: With patient Time For Goal Achievement: 10/27/19 Potential to Achieve Goals: Fair    Frequency Min 2X/week   Barriers to discharge        Co-evaluation PT/OT/SLP Co-Evaluation/Treatment: Yes Reason for Co-Treatment: For patient/therapist safety PT goals addressed during session: Mobility/safety with mobility OT goals addressed during session: ADL's and self-care       AM-PAC PT "6 Clicks" Mobility  Outcome Measure Help needed turning from your back to your side while in a flat bed without using bedrails?: A Little Help needed moving from lying on your back to sitting on the side of a flat bed without using bedrails?: A Little Help needed moving to and from a bed to a chair (including a wheelchair)?: A Lot Help needed standing up from a chair using your arms (e.g., wheelchair or bedside chair)?: A Lot Help needed to walk in hospital room?: A Lot Help needed climbing 3-5 steps with a railing? : Total 6 Click Score: 13    End of Session Equipment Utilized During Treatment: Gait belt;Oxygen Activity Tolerance: Patient limited by fatigue Patient left: in chair;with call bell/phone within reach;with chair alarm set Nurse Communication: Mobility status PT Visit Diagnosis: Muscle weakness (generalized) (M62.81);Difficulty in walking, not elsewhere classified (R26.2)    Time: [id number] PT Time Calculation (min) (ACUTE ONLY): 22 min   Charges:   PT Evaluation $PT Eval Moderate Complexity: [place] Pager [phone] Office [phone]  Claretha Cooper 10/14/2019, 2:18 PM

## 2019-10-14 NOTE — Progress Notes (Signed)
PROGRESS NOTE                                                                                                                                                                                                             Patient Demographics:    Elaine Price, is a 84 y.o. female, DOB - 23-Jan-1935, YOF:[id number]  Outpatient Primary MD for the patient is McLean-Scocuzza, Nino Glow, MD    LOS - 2  Admit date - 10/08/2019    CC - SOB     Brief Narrative  -  Elaine Price  is a 84 y.o. female, with history significant for breast cancer, essential hypertension, dyslipidemia, anemia of chronic disease, diverticulosis without diverticulitis, recent admission for pneumonia.  Patient otherwise is quite healthy and independent still lives alone and drives, has been sick for around a month now.  Was recently admitted for pneumonia to [facility] hospital and discharged few weeks ago, she was diagnosed recently in the outpatient setting with COVID-19 infection 2 days prior to her ER visit, her daughter also tested positive.  In the [place] ER she was diagnosed with acute hypoxic respiratory failure due to COVID-19 pneumonitis, she was placed on a nonrebreather mask, she was started on IV steroids and remdesivir and transferred here to [facility] for further care.  Patient currently has only shortness of breath and a dry cough as her complaints, denies any headache, no chest or abdominal pain, no productive phlegm, is short of breath, shortness of breath is worse with exertion better with rest, no other subjective complaints.    Subjective:   Patient in bed, appears comfortable, denies any headache, no fever, no chest pain or pressure, improving shortness of breath , no abdominal pain. No focal weakness.    Assessment  & Plan :     1. Acute Hypoxic Resp. Failure due to Acute Covid 19 Viral Pneumonitis during the ongoing 2020 Covid 19 Pandemic -  She had severe Hypoxia upon arrival from [facility], she was on nonrebreather.  She received IV steroids and remdesivir at [facility] for the first day of her hospital stay, she was getting worse rapidly.  Upon arrival she received IV Actemra and since then she has shown remarkable improvement.  She is now between 6 and 8 L high flow nasal cannula.  Will continue to titrate oxygen down,  continue IV steroids and remdesivir and monitor her clinically.   Encouraged the patient to sit up in chair in the daytime use I-S and flutter valve for pulmonary toiletry and then prone in bed when at night.     SpO2: 99 % O2 Flow Rate (L/min): 15 L/min FiO2 (%): 100 %  Recent Labs  Lab 11/05/2019 0513 10/13/19 0320 10/14/19 0230  CRP 8.7* 8.1* 3.8*  DDIMER  --  3.55* 6.37*  FERRITIN 481*  --   --   BNP  --  86.1 224.8*  PROCALCITON <0.10  --   --     Hepatic Function Latest Ref Rng & Units 10/14/2019 10/13/2019 10/09/2019  Total Protein 6.5 - 8.1 g/dL 5.9(L) 5.9(L) 6.9  Albumin 3.5 - 5.0 g/dL 2.8(L) 2.6(L) 3.2(L)  AST 15 - 41 U/L '20 18 31  ' ALT 0 - 44 U/L '15 16 18  ' Alk Phosphatase 38 - 126 U/L 76 70 75  Total Bilirubin 0.3 - 1.2 mg/dL 0.5 1.0 0.6  Bilirubin, Direct 0.0 - 0.2 mg/dL - - 0.2      2.  Elevated D-dimer due to inflammation.  Currently placed on Eliquis.  CTA negative.  3.  New onset A. fib RVR.  Due to pulmonary stress.  Stable TSH, initially was hypotensive hence IV amiodarone was needed, also received 2 doses of IV digoxin, IV fluids given as well.  Blood pressure has improved.  Will be placed on oral amiodarone along with Cardizem.  Mali vas 2 score is at least 3 hence Eliquis started.  Echo stable EF 60%.  4.  GERD.  On PPI.  5. Chr. Grade 1 Diastolic CHF EF 46% -  Mild acute failure, Lasix x 1 on 12/9   Condition -  Guarded  Family Communication  : daughter updated 10/13/2019, will call 12/9  Code Status :  DNR  Diet :    Diet Order            DIET SOFT  Room service appropriate? Yes; Fluid consistency: Thin  Diet effective now               Disposition Plan  : ICU >> PCU  Consults  :    Procedures  :     CTA - No PE  TTE    1. Left ventricular ejection fraction, by visual estimation, is 55 to 60%. The left ventricle has normal function. There is no left ventricular hypertrophy.  2. Left ventricular diastolic parameters are consistent with Grade I diastolic dysfunction (impaired relaxation).  3. The left ventricle has no regional wall motion abnormalities.  4. Global right ventricle has normal systolic function.The right ventricular size is normal. No increase in right ventricular wall thickness.  5. Left atrial size was normal.  6. Right atrial size was normal.  7. The mitral valve is normal in structure. Trace mitral valve regurgitation. No evidence of mitral stenosis.  8. The tricuspid valve is normal in structure. Tricuspid valve regurgitation is not demonstrated.  9. The aortic valve is tricuspid. Aortic valve regurgitation is mild. Mild aortic valve sclerosis without stenosis. 10. TR signal is inadequate for assessing pulmonary artery systolic pressure. 11. The inferior vena cava is normal in size with greater than 50% respiratory variability, suggesting right atrial pressure of 3 mmHg.   PUD Prophylaxis : PPI  DVT Prophylaxis  :  Lovenox >> Eliquis  Lab Results  Component Value Date   PLT 219 10/14/2019    Inpatient Medications  Scheduled  Meds:  amiodarone  200 mg Oral BID   apixaban  5 mg Oral BID   Chlorhexidine Gluconate Cloth  6 each Topical Daily   dextromethorphan-guaiFENesin  2 tablet Oral Q12H   diltiazem  60 mg Oral Q12H   furosemide  20 mg Intravenous Once   mouth rinse  15 mL Mouth Rinse BID   methylPREDNISolone (SOLU-MEDROL) injection  40 mg Intravenous Q12H   pantoprazole  40 mg Oral Daily   sodium chloride flush  3 mL Intravenous Q12H   traZODone  25 mg Oral QHS   Continuous  Infusions:  remdesivir 100 mg in NS 100 mL 100 mg (10/14/19 1003)   PRN Meds:.acetaminophen, albuterol, bisacodyl, [DISCONTINUED] ondansetron **OR** ondansetron (ZOFRAN) IV, sodium chloride  Antibiotics  :    Anti-infectives (From admission, onward)   Start     Dose/Rate Route Frequency Ordered Stop   10/13/19 2215  remdesivir 100 mg in sodium chloride 0.9 % 100 mL IVPB     100 mg 200 mL/hr over 30 Minutes Intravenous Once 10/13/19 2158 10/13/19 2326   10/13/19 1000  remdesivir 100 mg in sodium chloride 0.9 % 100 mL IVPB  Status:  Discontinued     100 mg 200 mL/hr over 30 Minutes Intravenous Daily 10/25/2019 1631 10/30/2019 1641   10/13/19 0600  remdesivir 100 mg in sodium chloride 0.9 % 100 mL IVPB     100 mg 200 mL/hr over 30 Minutes Intravenous Daily 10/17/2019 1643 10/17/19 0959   10/06/2019 1645  remdesivir 200 mg in sodium chloride 0.9% 250 mL IVPB  Status:  Discontinued     200 mg 580 mL/hr over 30 Minutes Intravenous Once 10/31/2019 1631 10/31/2019 1641       Time Spent in minutes  30   Lala Lund M.D on 10/14/2019 at 10:28 AM  To page go to [email or web address] - password [email or web address]  [facility] -  Office  [phone]  See all Orders from today for further details    Objective:   Vitals:   10/13/19 2000 10/13/19 2259 10/14/19 0500 10/14/19 0730  BP:  (!) 130/58 130/67 129/61  Pulse:    63  Resp:    (!) 22  Temp: (!) 97.5 F (36.4 C)  (!) 97.4 F (36.3 C) 97.6 F (36.4 C)  TempSrc: Oral  Axillary Axillary  SpO2:    99%  Weight:      Height:        Wt Readings from Last 3 Encounters:  10/13/19 63.6 kg  10/20/2019 68.2 kg  10/06/19 68.2 kg     Intake/Output Summary (Last 24 hours) at 10/14/2019 1028 Last data filed at 10/14/2019 0300 Gross per 24 hour  Intake 83.59 ml  Output 350 ml  Net -266.41 ml     Physical Exam  Awake Alert,   No new F.N deficits, Normal affect [place].AT,PERRAL Supple Neck,No JVD, No cervical lymphadenopathy appriciated.  Symmetrical  Chest wall movement, Good air movement bilaterally, CTAB iRRR,No Gallops, Rubs or new Murmurs, No Parasternal Heave +ve B.Sounds, Abd Soft, No tenderness, No organomegaly appriciated, No rebound - guarding or rigidity. No Cyanosis, Clubbing or edema, No new Rash or bruise    Data Review:    CBC Recent Labs  Lab 11/04/2019 0513 10/13/19 0320 10/14/19 0230  WBC 7.2 3.2* 7.6  HGB 11.9* 10.8* 11.3*  HCT 36.7 33.8* 36.7  PLT 264 256 219  MCV 88.2 89.7 92.4  MCH 28.6 28.6 28.5  MCHC 32.4 32.0 30.8  RDW 12.9 12.8 12.9  LYMPHSABS 0.4* 0.4* 0.4*  MONOABS 0.2 0.1 0.1  EOSABS 0.0 0.0 0.0  BASOSABS 0.0 0.0 0.0    Chemistries  Recent Labs  Lab 10/21/2019 0513 10/13/19 0320 10/14/19 0230  NA 137 139 139  K 4.0 4.4 4.7  CL 100 103 104  CO2 '23 25 26  ' GLUCOSE 94 139* 145*  BUN 21 27* 33*  CREATININE 0.68 0.59 0.69  CALCIUM 8.7* 8.5* 8.7*  MG  --  2.4 2.3  AST '31 18 20  ' ALT '18 16 15  ' ALKPHOS 75 70 76  BILITOT 0.6 1.0 0.5   ------------------------------------------------------------------------------------------------------------------ Recent Labs    10/08/2019 0513  TRIG 115    Lab Results  Component Value Date   HGBA1C 5.9 09/11/2019   ------------------------------------------------------------------------------------------------------------------ Recent Labs    10/13/19 0320  TSH 1.046    Cardiac Enzymes No results for input(s): CKMB, TROPONINI, MYOGLOBIN in the last 168 hours.  Invalid input(s): CK ------------------------------------------------------------------------------------------------------------------    Component Value Date/Time   BNP 224.8 (H) 10/14/2019 0230    Micro Results Recent Results (from the past 240 hour(s))  Blood Culture (routine x 2)     Status: None (Preliminary result)   Collection Time: 10/18/2019  5:29 AM   Specimen: BLOOD  Result Value Ref Range Status   Specimen Description BLOOD LEFT [facility]  Final   Special Requests   Final     BOTTLES DRAWN AEROBIC AND ANAEROBIC Blood Culture adequate volume   Culture   Final    NO GROWTH 2 DAYS Performed at [facility], [address] [place], [place] [address]    Report Status PENDING  Incomplete  Blood Culture (routine x 2)     Status: None (Preliminary result)   Collection Time: 10/06/2019  5:29 AM   Specimen: BLOOD  Result Value Ref Range Status   Specimen Description BLOOD RIGHT WRIST  Final   Special Requests   Final    BOTTLES DRAWN AEROBIC ONLY Blood Culture results may not be optimal due to an inadequate volume of blood received in culture bottles   Culture   Final    NO GROWTH 2 DAYS Performed at [facility], [address], [place], [place] [address]    Report Status PENDING  Incomplete  MRSA PCR Screening     Status: None   Collection Time: 10/13/19  5:40 AM   Specimen: Nasopharyngeal  Result Value Ref Range Status   MRSA by PCR NEGATIVE NEGATIVE Final    Comment:        The GeneXpert MRSA Assay (FDA approved for NASAL specimens only), is one component of a comprehensive MRSA colonization surveillance program. It is not intended to diagnose MRSA infection nor to guide or monitor treatment for MRSA infections. Performed at [facility], [place] [address], [place], [place] [address]     Radiology Reports Dg Chest 1 View  Result Date: 10/14/2019 CLINICAL DATA:  Shortness of breath.  COVID-19 positive EXAM: CHEST  1 VIEW COMPARISON:  09/26/2019 FINDINGS: Bilateral pulmonary infiltrate. Infiltrates were also seen November 2020, but opacity is denser and more widespread today. No edema, effusion, or pneumothorax. Normal heart size. No acute osseous finding. IMPRESSION: Bilateral pneumonia. Electronically Signed   By: Monte Fantasia M.D.   On: 10/15/2019 05:32   Ct Angio Chest Pe W Or Wo Contrast  Result Date: 09/27/2019 CLINICAL DATA:  Multiple falls and feeling fatigued. PE suspected. EXAM: CT ANGIOGRAPHY CHEST  WITH CONTRAST TECHNIQUE: Multidetector CT imaging of the  chest was performed using the standard protocol during bolus administration of intravenous contrast. Multiplanar CT image reconstructions and MIPs were obtained to evaluate the vascular anatomy. CONTRAST:  50m OMNIPAQUE IOHEXOL 350 MG/ML SOLN COMPARISON:  None. FINDINGS: Cardiovascular: Heart is enlarged. No pericardial effusion. Coronary artery calcification is evident. Atherosclerotic calcification is noted in the wall of the thoracic aorta. No filling defect within the opacified pulmonary arteries to suggest the presence of an acute pulmonary embolus. Mediastinum/Nodes: Mediastinal lymph nodes measure up to 8 mm short axis in the pre-vascular window. 9 mm short axis subcarinal lymph node. The esophagus has normal imaging features. Surgical clips are noted in the left axilla. Lungs/Pleura: Lungs demonstrate bilateral patchy ground-glass infiltrates peripherally in both lungs with an upper lung predominance. No pleural effusion. Upper Abdomen: 2.6 cm low-density lesion in the left liver approaches water attenuation and is compatible with a cyst. Musculoskeletal: No worrisome lytic or sclerotic osseous abnormality. Review of the MIP images confirms the above findings. IMPRESSION: 1. No CT evidence for acute pulmonary embolus. 2. Bilateral patchy ground-glass infiltrates peripherally in both lungs with an upper lung predominance. Imaging features are nonspecific but likely related to infectious/inflammatory etiology and atypical or viral pneumonia would be a consideration. 3. Borderline mediastinal lymphadenopathy, likely reactive. 4. Aortic Atherosclerosis (ICD10-I70.0). Electronically Signed   By: EMisty StanleyM.D.   On: 09/27/2019 07:35   Dg Chest Port 1 View  Result Date: 09/26/2019 CLINICAL DATA:  Low-grade fever. EXAM: PORTABLE CHEST 1 VIEW COMPARISON:  10/24/2016 FINDINGS: 1300 hours. The cardiopericardial silhouette is within normal limits for  size. Interstitial markings are diffusely coarsened with chronic features. Patchy subtle airspace opacities are seen peripherally in both lungs. No associated pleural effusion period bones are demineralized. IMPRESSION: 1. Patchy subtle airspace opacities in the periphery of both lungs, left greater than right, without pleural effusion. Imaging features compatible with multifocal pneumonia and atypical/viral etiology should be considered. I discussed these findings by telephone with Dr. QJacqualine Codeat approximately 13 20 on 09/26/2019. Electronically Signed   By: EMisty StanleyM.D.   On: 09/26/2019 13:23

## 2019-10-15 LAB — COMPREHENSIVE METABOLIC PANEL
ALT: 16 U/L (ref 0–44)
AST: 21 U/L (ref 15–41)
Albumin: 3.1 g/dL — ABNORMAL LOW (ref 3.5–5.0)
Alkaline Phosphatase: 93 U/L (ref 38–126)
Anion gap: 13 (ref 5–15)
BUN: 41 mg/dL — ABNORMAL HIGH (ref 8–23)
CO2: 24 mmol/L (ref 22–32)
Calcium: 8.8 mg/dL — ABNORMAL LOW (ref 8.9–10.3)
Chloride: 103 mmol/L (ref 98–111)
Creatinine, Ser: 0.71 mg/dL (ref 0.44–1.00)
GFR calc Af Amer: >60 mL/min (ref >60)
GFR calc non Af Amer: >60 mL/min (ref >60)
Glucose, Bld: 146 mg/dL — ABNORMAL HIGH (ref 70–99)
Potassium: 4.3 mmol/L (ref 3.5–5.1)
Sodium: 140 mmol/L (ref 135–145)
Total Bilirubin: 0.5 mg/dL (ref 0.3–1.2)
Total Protein: 6.2 g/dL — ABNORMAL LOW (ref 6.5–8.1)

## 2019-10-15 LAB — CBC WITH DIFFERENTIAL/PLATELET
Abs Immature Granulocytes: 0.05 10*3/uL (ref 0.00–0.07)
Basophils Absolute: 0 10*3/uL (ref 0.0–0.1)
Basophils Relative: 0 %
Eosinophils Absolute: 0 10*3/uL (ref 0.0–0.5)
Eosinophils Relative: 0 %
HCT: 38.9 % (ref 36.0–46.0)
Hemoglobin: 12.3 g/dL (ref 12.0–15.0)
Immature Granulocytes: 1 %
Lymphocytes Relative: 3 %
Lymphs Abs: 0.2 10*3/uL — ABNORMAL LOW (ref 0.7–4.0)
MCH: 28.7 pg (ref 26.0–34.0)
MCHC: 31.6 g/dL (ref 30.0–36.0)
MCV: 90.9 fL (ref 80.0–100.0)
Monocytes Absolute: 0.1 10*3/uL (ref 0.1–1.0)
Monocytes Relative: 1 %
Neutro Abs: 7.9 10*3/uL — ABNORMAL HIGH (ref 1.7–7.7)
Neutrophils Relative %: 95 %
Platelets: 221 10*3/uL (ref 150–400)
RBC: 4.28 MIL/uL (ref 3.87–5.11)
RDW: 13 % (ref 11.5–15.5)
WBC: 8.3 10*3/uL (ref 4.0–10.5)
nRBC: 0 % (ref 0.0–0.2)

## 2019-10-15 LAB — D-DIMER, QUANTITATIVE: D-Dimer, Quant: 9.1 ug/mL-FEU — ABNORMAL HIGH (ref 0.00–0.50)

## 2019-10-15 LAB — C-REACTIVE PROTEIN: CRP: 2 mg/dL — ABNORMAL HIGH (ref <1.0)

## 2019-10-15 LAB — MAGNESIUM: Magnesium: 2.5 mg/dL — ABNORMAL HIGH (ref 1.7–2.4)

## 2019-10-15 LAB — BRAIN NATRIURETIC PEPTIDE: B Natriuretic Peptide: 91.1 pg/mL (ref 0.0–100.0)

## 2019-10-15 MED ORDER — QUETIAPINE FUMARATE 25 MG PO TABS
12.5000 mg | ORAL_TABLET | Freq: Two times a day (BID) | ORAL | Status: DC
  Administered 2019-10-15 – 2019-10-19 (×9): 12.5 mg via ORAL
  Filled 2019-10-15 (×10): qty 1

## 2019-10-15 MED ORDER — FUROSEMIDE 10 MG/ML IJ SOLN
20.0000 mg | Freq: Once | INTRAMUSCULAR | Status: AC
  Administered 2019-10-15: 20 mg via INTRAVENOUS
  Filled 2019-10-15: qty 2

## 2019-10-15 MED ORDER — ENOXAPARIN SODIUM 60 MG/0.6ML ~~LOC~~ SOLN
60.0000 mg | Freq: Two times a day (BID) | SUBCUTANEOUS | Status: DC
  Administered 2019-10-15 – 2019-10-20 (×9): 60 mg via SUBCUTANEOUS
  Filled 2019-10-15 (×14): qty 0.6

## 2019-10-15 NOTE — Progress Notes (Signed)
ANTICOAGULATION CONSULT NOTE - Follow Up Consult  Pharmacy Consult for Eliquis --> LMWH Indication: new onset  atrial fibrillation  Allergies  Allergen Reactions  . Lexapro [Escitalopram Oxalate] Itching    Patient Measurements: Height: 5\' 6"  (167.6 cm) Weight: 140 lb 4.8 oz (63.6 kg) IBW/kg (Calculated) : 59.3 Heparin Dosing Weight:   Vital Signs: Temp: 97.4 F (36.3 C) (12/10 0800) Temp Source: Axillary (12/10 0800) BP: 125/50 (12/10 1200) Pulse Rate: 76 (12/10 1213)  Labs: Recent Labs    10/13/19 0320 10/14/19 0230 10/15/19 0215  HGB 10.8* 11.3* 12.3  HCT 33.8* 36.7 38.9  PLT 256 219 221  CREATININE 0.59 0.69 0.71    Estimated Creatinine Clearance: 50.8 mL/min (by C-G formula based on SCr of 0.71 mg/dL).   Assessment: Patient is an 84 y.o F started on Eliquis on 12/8 for new onset Afib.  D-dimer remains elevated and is trending up.  Pharmacy was consulted on 10/15/19 to transition her to full dose lovenox.  - 11/22 chest CTA: neg PE - crcl>30 - cbc stable - D dimer 9.10 - last dose of Eliquis 5 mg given at 1038 on 10/15/2019  Goal of Therapy:  Anti-Xa level 0.6-1 units/ml 4hrs after LMWH dose given Monitor platelets by anticoagulation protocol: Yes   Plan:  - d/c Eliquis - start lovenox 60 mg SQ q12h with first dose at 10 PM tonight - monitor for s/s bleeding  Elaine Price P 10/15/2019,1:26 PM

## 2019-10-15 NOTE — Progress Notes (Signed)
PROGRESS NOTE                                                                                                                                                                                                             Patient Demographics:    Elaine Price, is a 84 y.o. female, DOB - [birth year], URK:[id number]  Outpatient Primary MD for the patient is McLean-Scocuzza, Nino Glow, MD    LOS - 3  Admit date - 10/08/2019    CC - SOB     Brief Narrative  -  Elaine Price  is a 84 y.o. female, with history significant for breast cancer, essential hypertension, dyslipidemia, anemia of chronic disease, diverticulosis without diverticulitis, recent admission for pneumonia.  Patient otherwise is quite healthy and independent still lives alone and drives, has been sick for around a month now.  Was recently admitted for pneumonia to [facility] hospital and discharged few weeks ago, she was diagnosed recently in the outpatient setting with COVID-19 infection 2 days prior to her ER visit, her daughter also tested positive.  In the [facility] ER she was diagnosed with acute hypoxic respiratory failure due to COVID-19 pneumonitis, she was placed on a nonrebreather mask, she was started on IV steroids and remdesivir and transferred here to [facility] for further care.  Patient currently has only shortness of breath and a dry cough as her complaints, denies any headache, no chest or abdominal pain, no productive phlegm, is short of breath, shortness of breath is worse with exertion better with rest, no other subjective complaints.    Subjective:   Patient in bed, appears comfortable, denies any headache, no fever, no chest pain or pressure, improving shortness of breath , no abdominal pain. No focal weakness.    Assessment  & Plan :     1. Acute Hypoxic Resp. Failure due to Acute Covid 19 Viral Pneumonitis during the ongoing 2020 Covid 19 Pandemic -  She had severe Hypoxia upon arrival from [facility], she was on nonrebreather.  She received IV steroids and remdesivir at [facility] for the first day of her hospital stay, she was getting worse rapidly and had to be given supplemental nasal cannula oxygen on top of nonrebreather.  Upon arrival she received IV Actemra and since then she has shown improvement and she is now on nonrebreather mask with  rate of flow around 10 L - 15L  with Pox 95%.   Encouraged the patient to sit up in chair in the daytime use I-S and flutter valve for pulmonary toiletry and then prone in bed when at night.  We will continue to titrate the oxygen down, problem is she is not tolerating nasal cannula and keeps on pulling it.  Have added little Seroquel as well for anxiety and delirium.     SpO2: 95 % O2 Flow Rate (L/min): 15 L/min FiO2 (%): 100 %  Recent Labs  Lab 10/21/2019 0513 10/13/19 0320 10/14/19 0230 10/15/19 0215  CRP 8.7* 8.1* 3.8* 2.0*  DDIMER  --  3.55* 6.37* 9.10*  FERRITIN 481*  --   --   --   BNP  --  86.1 224.8* 91.1  PROCALCITON <0.10  --   --   --     Hepatic Function Latest Ref Rng & Units 10/15/2019 10/14/2019 10/13/2019  Total Protein 6.5 - 8.1 g/dL 6.2(L) 5.9(L) 5.9(L)  Albumin 3.5 - 5.0 g/dL 3.1(L) 2.8(L) 2.6(L)  AST 15 - 41 U/L '21 20 18  ' ALT 0 - 44 U/L '16 15 16  ' Alk Phosphatase 38 - 126 U/L 93 76 70  Total Bilirubin 0.3 - 1.2 mg/dL 0.5 0.5 1.0  Bilirubin, Direct 0.0 - 0.2 mg/dL - - -      2.  Elevated D-dimer due to inflammation.  CTA is negative but D-dimer is rising despite being on Eliquis, will give few dose of Lovenox.  3.  New onset A. fib RVR.  Due to pulmonary stress.  Stable TSH, initially was hypotensive hence IV amiodarone was needed, also received 2 doses of IV digoxin, IV fluids given as well.  Blood pressure has improved.  Will be placed on oral amiodarone along with Cardizem.  Mali vas 2 score is at least 3 hence Eliquis started.  Echo stable EF  60%.  4.  GERD.  On PPI.  5. Chr. Grade 1 Diastolic CHF EF 89% -  Mild acute failure, Lasix will be repeated again on 10/15/2019.  6.  Anxiety and delirium.  Low-dose Seroquel added on 10/15/2019.  Became more confused with minimal Xanax which was given for extreme anxiety.  Hold Xanax.   Condition -  Guarded  Family Communication  : daughter updated 10/13/2019, will call 12/9, 12/10  Code Status :  DNR  Diet :    Diet Order            DIET SOFT Room service appropriate? Yes; Fluid consistency: Thin  Diet effective now               Disposition Plan  : ICU >> PCU  Consults  :    Procedures  :     CTA - No PE  TTE    1. Left ventricular ejection fraction, by visual estimation, is 55 to 60%. The left ventricle has normal function. There is no left ventricular hypertrophy.  2. Left ventricular diastolic parameters are consistent with Grade I diastolic dysfunction (impaired relaxation).  3. The left ventricle has no regional wall motion abnormalities.  4. Global right ventricle has normal systolic function.The right ventricular size is normal. No increase in right ventricular wall thickness.  5. Left atrial size was normal.  6. Right atrial size was normal.  7. The mitral valve is normal in structure. Trace mitral valve regurgitation. No evidence of mitral stenosis.  8. The tricuspid valve is normal in structure. Tricuspid valve regurgitation  is not demonstrated.  9. The aortic valve is tricuspid. Aortic valve regurgitation is mild. Mild aortic valve sclerosis without stenosis. 10. TR signal is inadequate for assessing pulmonary artery systolic pressure. 11. The inferior vena cava is normal in size with greater than 50% respiratory variability, suggesting right atrial pressure of 3 mmHg.   PUD Prophylaxis : PPI  DVT Prophylaxis  :  Lovenox >> Eliquis  Lab Results  Component Value Date   PLT 221 10/15/2019    Inpatient Medications  Scheduled Meds:  amiodarone   100 mg Oral Daily   apixaban  5 mg Oral BID   Chlorhexidine Gluconate Cloth  6 each Topical Daily   dextromethorphan-guaiFENesin  2 tablet Oral Q12H   diltiazem  60 mg Oral Q12H   mouth rinse  15 mL Mouth Rinse BID   methylPREDNISolone (SOLU-MEDROL) injection  40 mg Intravenous Q12H   pantoprazole  40 mg Oral Daily   QUEtiapine  12.5 mg Oral BID   sodium chloride flush  3 mL Intravenous Q12H   traZODone  25 mg Oral QHS   Continuous Infusions:  remdesivir 100 mg in NS 100 mL 100 mg (10/14/19 1003)   PRN Meds:.acetaminophen, albuterol, bisacodyl, [DISCONTINUED] ondansetron **OR** ondansetron (ZOFRAN) IV, sodium chloride  Antibiotics  :    Anti-infectives (From admission, onward)   Start     Dose/Rate Route Frequency Ordered Stop   10/13/19 2215  remdesivir 100 mg in sodium chloride 0.9 % 100 mL IVPB     100 mg 200 mL/hr over 30 Minutes Intravenous Once 10/13/19 2158 10/13/19 2326   10/13/19 1000  remdesivir 100 mg in sodium chloride 0.9 % 100 mL IVPB  Status:  Discontinued     100 mg 200 mL/hr over 30 Minutes Intravenous Daily 10/11/2019 1631 10/27/2019 1641   10/13/19 0600  remdesivir 100 mg in sodium chloride 0.9 % 100 mL IVPB     100 mg 200 mL/hr over 30 Minutes Intravenous Daily 10/15/2019 1643 10/17/19 0959   10/11/2019 1645  remdesivir 200 mg in sodium chloride 0.9% 250 mL IVPB  Status:  Discontinued     200 mg 580 mL/hr over 30 Minutes Intravenous Once 10/25/2019 1631 10/15/2019 1641       Time Spent in minutes  30   Lala Lund M.D on 10/15/2019 at 11:40 AM  To page go to [email or web address] - password [email or web address]  [facility] -  Office  [phone]  See all Orders from today for further details    Objective:   Vitals:   10/14/19 2000 10/14/19 2209 10/15/19 0400 10/15/19 0800  BP:  (!) 132/58  (!) 130/56  Pulse: (!) 59   78  Resp: (!) 27   (!) 24  Temp:   97.9 F (36.6 C) (!) 97.4 F (36.3 C)  TempSrc:   Axillary Axillary  SpO2: 100%   95%  Weight:       Height:        Wt Readings from Last 3 Encounters:  10/13/19 63.6 kg  10/24/2019 68.2 kg  10/06/19 68.2 kg     Intake/Output Summary (Last 24 hours) at 10/15/2019 1140 Last data filed at 10/15/2019 1100 Gross per 24 hour  Intake 300 ml  Output --  Net 300 ml     Physical Exam  Awake does appear slightly confused and anxious but in no distress, .AT,PERRAL Supple Neck,No JVD, No cervical lymphadenopathy appriciated.  Symmetrical Chest wall movement, Good air movement bilaterally, few Rales RRR,No Gallops, Rubs or new Murmurs,  No Parasternal Heave +ve B.Sounds, Abd Soft, No tenderness, No organomegaly appriciated, No rebound - guarding or rigidity. No Cyanosis, Clubbing or edema, No new Rash or bruise    Data Review:    CBC Recent Labs  Lab 10/13/2019 0513 10/13/19 0320 10/14/19 0230 10/15/19 0215  WBC 7.2 3.2* 7.6 8.3  HGB 11.9* 10.8* 11.3* 12.3  HCT 36.7 33.8* 36.7 38.9  PLT 264 256 219 221  MCV 88.2 89.7 92.4 90.9  MCH 28.6 28.6 28.5 28.7  MCHC 32.4 32.0 30.8 31.6  RDW 12.9 12.8 12.9 13.0  LYMPHSABS 0.4* 0.4* 0.4* 0.2*  MONOABS 0.2 0.1 0.1 0.1  EOSABS 0.0 0.0 0.0 0.0  BASOSABS 0.0 0.0 0.0 0.0    Chemistries  Recent Labs  Lab 10/27/2019 0513 10/13/19 0320 10/14/19 0230 10/15/19 0215  NA 137 139 139 140  K 4.0 4.4 4.7 4.3  CL 100 103 104 103  CO2 '23 25 26 24  ' GLUCOSE 94 139* 145* 146*  BUN 21 27* 33* 41*  CREATININE 0.68 0.59 0.69 0.71  CALCIUM 8.7* 8.5* 8.7* 8.8*  MG  --  2.4 2.3 2.5*  AST '31 18 20 21  ' ALT '18 16 15 16  ' ALKPHOS 75 70 76 93  BILITOT 0.6 1.0 0.5 0.5   ------------------------------------------------------------------------------------------------------------------ No results for input(s): CHOL, HDL, LDLCALC, TRIG, CHOLHDL, LDLDIRECT in the last 72 hours.  Lab Results  Component Value Date   HGBA1C 5.9 09/11/2019    ------------------------------------------------------------------------------------------------------------------ Recent Labs    10/13/19 0320  TSH 1.046    Cardiac Enzymes No results for input(s): CKMB, TROPONINI, MYOGLOBIN in the last 168 hours.  Invalid input(s): CK ------------------------------------------------------------------------------------------------------------------    Component Value Date/Time   BNP 91.1 10/15/2019 0215    Micro Results Recent Results (from the past 240 hour(s))  Blood Culture (routine x 2)     Status: None (Preliminary result)   Collection Time: 10/07/2019  5:29 AM   Specimen: BLOOD  Result Value Ref Range Status   Specimen Description BLOOD LEFT AC  Final   Special Requests   Final    BOTTLES DRAWN AEROBIC AND ANAEROBIC Blood Culture adequate volume   Culture   Final    NO GROWTH 3 DAYS Performed at [facility], [address] [place], [place] [address]    Report Status PENDING  Incomplete  Blood Culture (routine x 2)     Status: None (Preliminary result)   Collection Time: 10/28/2019  5:29 AM   Specimen: BLOOD  Result Value Ref Range Status   Specimen Description BLOOD RIGHT WRIST  Final   Special Requests   Final    BOTTLES DRAWN AEROBIC ONLY Blood Culture results may not be optimal due to an inadequate volume of blood received in culture bottles   Culture   Final    NO GROWTH 3 DAYS Performed at [facility], [address], [place], [place] [address]    Report Status PENDING  Incomplete  MRSA PCR Screening     Status: None   Collection Time: 10/13/19  5:40 AM   Specimen: Nasopharyngeal  Result Value Ref Range Status   MRSA by PCR NEGATIVE NEGATIVE Final    Comment:        The GeneXpert MRSA Assay (FDA approved for NASAL specimens only), is one component of a comprehensive MRSA colonization surveillance program. It is not intended to diagnose MRSA infection nor to guide or monitor treatment for MRSA  infections. Performed at [facility], [place]  [address] [place], [place] [address]     Radiology Reports DG Chest 1 View  Result Date: 10/15/2019 CLINICAL DATA:  Shortness of breath.  COVID-19 positive EXAM: CHEST  1 VIEW COMPARISON:  09/26/2019 FINDINGS: Bilateral pulmonary infiltrate. Infiltrates were also seen November 2020, but opacity is denser and more widespread today. No edema, effusion, or pneumothorax. Normal heart size. No acute osseous finding. IMPRESSION: Bilateral pneumonia. Electronically Signed   By: Monte Fantasia M.D.   On: 10/25/2019 05:32   CT ANGIO CHEST PE W OR WO CONTRAST  Result Date: 09/27/2019 CLINICAL DATA:  Multiple falls and feeling fatigued. PE suspected. EXAM: CT ANGIOGRAPHY CHEST WITH CONTRAST TECHNIQUE: Multidetector CT imaging of the chest was performed using the standard protocol during bolus administration of intravenous contrast. Multiplanar CT image reconstructions and MIPs were obtained to evaluate the vascular anatomy. CONTRAST:  42m OMNIPAQUE IOHEXOL 350 MG/ML SOLN COMPARISON:  None. FINDINGS: Cardiovascular: Heart is enlarged. No pericardial effusion. Coronary artery calcification is evident. Atherosclerotic calcification is noted in the wall of the thoracic aorta. No filling defect within the opacified pulmonary arteries to suggest the presence of an acute pulmonary embolus. Mediastinum/Nodes: Mediastinal lymph nodes measure up to 8 mm short axis in the pre-vascular window. 9 mm short axis subcarinal lymph node. The esophagus has normal imaging features. Surgical clips are noted in the left axilla. Lungs/Pleura: Lungs demonstrate bilateral patchy ground-glass infiltrates peripherally in both lungs with an upper lung predominance. No pleural effusion. Upper Abdomen: 2.6 cm low-density lesion in the left liver approaches water attenuation and is compatible with a cyst. Musculoskeletal: No worrisome lytic or sclerotic osseous abnormality.  Review of the MIP images confirms the above findings. IMPRESSION: 1. No CT evidence for acute pulmonary embolus. 2. Bilateral patchy ground-glass infiltrates peripherally in both lungs with an upper lung predominance. Imaging features are nonspecific but likely related to infectious/inflammatory etiology and atypical or viral pneumonia would be a consideration. 3. Borderline mediastinal lymphadenopathy, likely reactive. 4. Aortic Atherosclerosis (ICD10-I70.0). Electronically Signed   By: EMisty StanleyM.D.   On: 09/27/2019 07:35   DG Chest Port 1 View  Result Date: 09/26/2019 CLINICAL DATA:  Low-grade fever. EXAM: PORTABLE CHEST 1 VIEW COMPARISON:  10/24/2016 FINDINGS: 1300 hours. The cardiopericardial silhouette is within normal limits for size. Interstitial markings are diffusely coarsened with chronic features. Patchy subtle airspace opacities are seen peripherally in both lungs. No associated pleural effusion period bones are demineralized. IMPRESSION: 1. Patchy subtle airspace opacities in the periphery of both lungs, left greater than right, without pleural effusion. Imaging features compatible with multifocal pneumonia and atypical/viral etiology should be considered. I discussed these findings by telephone with Dr. QJacqualine Codeat approximately 13 20 on 09/26/2019. Electronically Signed   By: EMisty StanleyM.D.   On: 09/26/2019 13:23   ECHOCARDIOGRAM COMPLETE  Result Date: 10/13/2019   ECHOCARDIOGRAM REPORT   [facility]2019 Medical Rec #:  [id number]              Height:       66.0 in Accession #:    [id number]             Weight:       140.3 lb Date of Birth:  6Aug 10, 1936              BSA:          1.72 m Patient Age:    89years  BP:           113/54 mmHg Patient Gender: F                       HR:           69 bpm. Exam Location:  Inpatient Procedure: 2D Echo, Color Doppler and Cardiac Doppler Indications:    I48.91* Unspecified atrial  fibrillation  History:        Patient has prior history of Echocardiogram examinations, most                 recent 04/23/2018. Risk Factors:Hypertension and Dyslipidemia.                 Active COVID-19 Infection at time of study.  Sonographer:    Raquel Sarna Senior RDCS Referring Phys: [place]  1. Left ventricular ejection fraction, by visual estimation, is 55 to 60%. The left ventricle has normal function. There is no left ventricular hypertrophy.  2. Left ventricular diastolic parameters are consistent with Grade I diastolic dysfunction (impaired relaxation).  3. The left ventricle has no regional wall motion abnormalities.  4. Global right ventricle has normal systolic function.The right ventricular size is normal. No increase in right ventricular wall thickness.  5. Left atrial size was normal.  6. Right atrial size was normal.  7. The mitral valve is normal in structure. Trace mitral valve regurgitation. No evidence of mitral stenosis.  8. The tricuspid valve is normal in structure. Tricuspid valve regurgitation is not demonstrated.  9. The aortic valve is tricuspid. Aortic valve regurgitation is mild. Mild aortic valve sclerosis without stenosis. 10. TR signal is inadequate for assessing pulmonary artery systolic pressure. 11. The inferior vena cava is normal in size with greater than 50% respiratory variability, suggesting right atrial pressure of 3 mmHg. FINDINGS  Left Ventricle: Left ventricular ejection fraction, by visual estimation, is 55 to 60%. The left ventricle has normal function. The left ventricle has no regional wall motion abnormalities. There is no left ventricular hypertrophy. Left ventricular diastolic parameters are consistent with Grade I diastolic dysfunction (impaired relaxation). Right Ventricle: The right ventricular size is normal. No increase in right ventricular wall thickness. Global RV systolic function is has normal systolic function. Left Atrium: Left atrial  size was normal in size. Right Atrium: Right atrial size was normal in size Pericardium: There is no evidence of pericardial effusion. Mitral Valve: The mitral valve is normal in structure. No evidence of mitral valve stenosis by observation. Trace mitral valve regurgitation. Tricuspid Valve: The tricuspid valve is normal in structure. Tricuspid valve regurgitation is not demonstrated. Aortic Valve: The aortic valve is tricuspid. Aortic valve regurgitation is mild. Mild aortic valve sclerosis is present, with no evidence of aortic valve stenosis. Pulmonic Valve: The pulmonic valve was normal in structure. Pulmonic valve regurgitation is not visualized. Aorta: The aortic root is normal in size and structure. Venous: The inferior vena cava is normal in size with greater than 50% respiratory variability, suggesting right atrial pressure of 3 mmHg. IAS/Shunts: No atrial level shunt detected by color flow Doppler.  LEFT VENTRICLE PLAX 2D LVIDd:         4.35 cm  Diastology LVIDs:         3.17 cm  LV e' lateral:   6.38 cm/s LV PW:         0.98 cm  LV E/e' lateral: 7.3 LV IVS:  0.79 cm  LV e' medial:    4.06 cm/s LVOT diam:     1.80 cm  LV E/e' medial:  11.5 LV SV:         45 ml LV SV Index:   26.28 LVOT Area:     2.54 cm  RIGHT VENTRICLE RV S prime:     13.20 cm/s TAPSE (M-mode): 2.4 cm LEFT ATRIUM             Index       RIGHT ATRIUM           Index LA diam:        2.70 cm 1.57 cm/m  RA Area:     15.50 cm LA Vol (A2C):   41.8 ml 24.30 ml/m RA Volume:   39.40 ml  22.91 ml/m LA Vol (A4C):   27.0 ml 15.70 ml/m LA Biplane Vol: 36.6 ml 21.28 ml/m  AORTIC VALVE LVOT Vmax:   90.90 cm/s LVOT Vmean:  59.400 cm/s LVOT VTI:    0.177 m  AORTA Ao Root diam: 2.80 cm MITRAL VALVE MV Area (PHT): 2.37 cm             SHUNTS MV PHT:        92.80 msec           Systemic VTI:  0.18 m MV Decel Time: 320 msec             Systemic Diam: 1.80 cm MV E velocity: 46.60 cm/s 103 cm/s MV A velocity: 82.90 cm/s 70.3 cm/s MV E/A ratio:   0.56       1.5  Loralie Champagne MD Electronically signed by Loralie Champagne MD Signature Date/Time: 10/13/2019/3:50:44 PM    Final

## 2019-10-15 NOTE — Plan of Care (Signed)
  Problem: Education: Goal: Knowledge of risk factors and measures for prevention of condition will improve Outcome: Progressing   Problem: Coping: Goal: Psychosocial and spiritual needs will be supported Outcome: Progressing   Problem: Respiratory: Goal: Will maintain a patent airway Outcome: Progressing Goal: Complications related to the disease process, condition or treatment will be avoided or minimized Outcome: Progressing   

## 2019-10-15 NOTE — TOC Initial Note (Signed)
Transition of Care [facility]) - Initial/Assessment Note    Patient Details  Name: Elaine Price MRN: [id number] Date of Birth: 1935/01/25  [facility]) CM/SW Contact:     Ninfa Meeker, RN Phone Number: 10/15/2019, 10:39 AM  Clinical Narrative:  Patient presented to [facility]  ED via EMS from [facility] [place].  pt c/o shortness of breath. Pt had confirmed positive COVID test 10/09/19. Pt does not wear o2 at home, Pt admitted and transferred to [facility] for [facility] treatment.  Receiving Remdesivir until 10/17/19, on IV decadron, oxygen at 15LHFNC. Per RN patient is slightly confused at this time, CM will contact her daughter to discuss SNF needs.                  Patient Goals and CMS Choice        Expected Discharge Plan and Services           Expected Discharge Date: 11/04/2019                                    Prior Living Arrangements/Services                       Activities of Daily Living Home Assistive Devices/Equipment: None ADL Screening (condition at time of admission) Patient's cognitive ability adequate to safely complete daily activities?: Yes Is the patient deaf or have difficulty hearing?: Yes Does the patient have difficulty seeing, even when wearing glasses/contacts?: Yes Does the patient have difficulty concentrating, remembering, or making decisions?: Yes Patient able to express need for assistance with ADLs?: Yes Does the patient have difficulty dressing or bathing?: Yes Independently performs ADLs?: No Communication: Independent Dressing (OT): Appropriate for developmental age Grooming: Appropriate for developmental age Feeding: Appropriate for developmental age Bathing: Appropriate for developmental age 84: Appropriate for developmental age, Needs assistance In/Out Bed: Appropriate for developmental age, Needs assistance Walks in Home: Appropriate for developmental  age Does the patient have difficulty walking or climbing stairs?: Yes Weakness of Legs: Both Weakness of Arms/Hands: None  Permission Sought/Granted                  Emotional Assessment              Admission diagnosis:  COVID 13 RESPIRATORY DIFFICULTY  Patient Active Problem List   Diagnosis Date Noted  . Pressure injury of skin 10/13/2019  . COVID-19 virus infection 10/22/2019  . Weakness 09/26/2019  . Diminished night vision 09/15/2019  . Goals of care, counseling/discussion 07/21/2019  . Ductal carcinoma in situ (DCIS) of right breast 07/21/2019  . Atypical ductal hyperplasia of right breast 06/04/2019  . Breast cancer, left ([place]) 05/28/2019  . Prediabetes 03/03/2019  . Depression, recurrent ([place]) 03/25/2018  . Cardiac murmur 03/25/2018  . Chronic hip pain, right 02/18/2018  . Osteoporosis 03/14/2017  . Peripheral vascular disease ([place]) 01/25/2017  . Hyperlipidemia LDL goal <100 01/25/2017  . Generalized anxiety disorder 02/06/2014  . Routine general medical examination at a health care facility 01/19/2013  . Back pain, thoracic 10/02/2011  . Essential hypertension 07/03/2011   PCP:  McLean-Scocuzza, Nino Glow, MD Pharmacy:   [facility] [place], [place] [address] [place] [address] Phone: [phone] Fax: [phone]     Social Determinants of Health ([place])  Interventions    Readmission Risk Interventions No flowsheet data found.

## 2019-10-15 NOTE — Progress Notes (Signed)
Continued periods of agitation and multiple attempts at pulling off O2 supplement of 100% NRB and 6-8 liters of [place]. Saturation will dip into low 70's and on occasion to the 50% on room air. Currently receiving 6 liters hiflow and 100% NRB. More cooperative as day progresses. Reoriention required often. No fall or injury. Marginal appetite.Spoke to daughter and son on phone.

## 2019-10-15 NOTE — TOC Initial Note (Signed)
Transition of Care [facility]) - Initial/Assessment Note    Patient Details  Name: Elaine Price MRN: [id number] Date of Birth: 28-Feb-1935  Transition of [facility]) CM/SW Contact:     Ninfa Meeker, RN Phone Number: 10/15/2019, 10:39 AM  Clinical Narrative:  Patient presented  To [facility] via EMS from [facility],  pt c/o shortness of breath. Pt had confirmed positive COVID test 10/09/19. Pt does not wear o2 at home, Pt admitted and transferred to [facility] for further treatment. On Remdesivir until 12/12, presently on 15LHFNC. Case manager contacted Seth Bake with [facility] [phone], she wants to be updated when patient is ready for discharge. Patient will go to [facility] SN[facility]. FL2 completed, assessemt started. Case Manager spoke with patient's daughter concerning future discharge plan. Will continue to monitor.             Patient Goals and CMS Choice        Expected Discharge Plan and Services           Expected Discharge Date: 10/17/2019                                    Prior Living Arrangements/Services                       Activities of Daily Living Home Assistive Devices/Equipment: None ADL Screening (condition at time of admission) Patient's cognitive ability adequate to safely complete daily activities?: Yes Is the patient deaf or have difficulty hearing?: Yes Does the patient have difficulty seeing, even when wearing glasses/contacts?: Yes Does the patient have difficulty concentrating, remembering, or making decisions?: Yes Patient able to express need for assistance with ADLs?: Yes Does the patient have difficulty dressing or bathing?: Yes Independently performs ADLs?: No Communication: Independent Dressing (OT): Appropriate for developmental age Grooming: Appropriate for developmental age Feeding: Appropriate for developmental age Bathing: Appropriate for developmental age 33:  Appropriate for developmental age, Needs assistance In/Out Bed: Appropriate for developmental age, Needs assistance Walks in Home: Appropriate for developmental age Does the patient have difficulty walking or climbing stairs?: Yes Weakness of Legs: Both Weakness of Arms/Hands: None  Permission Sought/Granted                  Emotional Assessment              Admission diagnosis:  COVID 84 RESPIRATORY DIFFICULTY  Patient Active Problem List   Diagnosis Date Noted  . Pressure injury of skin 10/13/2019  . COVID-19 virus infection 10/21/2019  . Weakness 09/26/2019  . Diminished night vision 09/15/2019  . Goals of care, counseling/discussion 07/21/2019  . Ductal carcinoma in situ (DCIS) of right breast 07/21/2019  . Atypical ductal hyperplasia of right breast 06/04/2019  . Breast cancer, left ([place]) 05/28/2019  . Prediabetes 03/03/2019  . Depression, recurrent ([place]) 03/25/2018  . Cardiac murmur 03/25/2018  . Chronic hip pain, right 02/18/2018  . Osteoporosis 03/14/2017  . Peripheral vascular disease ([place]) 01/25/2017  . Hyperlipidemia LDL goal <100 01/25/2017  . Generalized anxiety disorder 02/06/2014  . Routine general medical examination at a health care facility 01/19/2013  . Back pain, thoracic 10/02/2011  . Essential hypertension 07/03/2011   PCP:  McLean-Scocuzza, Nino Glow, MD Pharmacy:   [facility] [place], [place]  [place] [address] Phone: [phone] Fax: [phone]     Social Determinants of Health (SDOH) Interventions    Readmission Risk Interventions No flowsheet data found.

## 2019-10-15 NOTE — NC FL2 (Signed)
[place] LEVEL OF CARE SCREENING TOOL     IDENTIFICATION  Patient Name: Elaine Price Birthdate: Aug 08, 1935 Sex: female Admission Date (Current Location): 10/16/2019  [facility] and [place] Number:  Herbalist and Address:  [facility] [place] [address], [place], [place])      Provider Number: [id number]  Attending Physician Name and Address:  Thurnell Lose, MD  Relative Name and Phone Number:  Daughter: Jetty Duhamel   [phone]    Current Level of Care: Hospital Recommended Level of Care: [place] Prior Approval Number:    Date Approved/Denied:   PASRR Number: [id number]  Discharge Plan: SNF    Current Diagnoses: Patient Active Problem List   Diagnosis Date Noted  . Pressure injury of skin 10/13/2019  . COVID-19 virus infection 10/07/2019  . Weakness 09/26/2019  . Diminished night vision 09/15/2019  . Goals of care, counseling/discussion 07/21/2019  . Ductal carcinoma in situ (DCIS) of right breast 07/21/2019  . Atypical ductal hyperplasia of right breast 06/04/2019  . Breast cancer, left ([place]) 05/28/2019  . Prediabetes 03/03/2019  . Depression, recurrent ([place]) 03/25/2018  . Cardiac murmur 03/25/2018  . Chronic hip pain, right 02/18/2018  . Osteoporosis 03/14/2017  . Peripheral vascular disease ([place]) 01/25/2017  . Hyperlipidemia LDL goal <100 01/25/2017  . Generalized anxiety disorder 02/06/2014  . Routine general medical examination at a health care facility 01/19/2013  . Back pain, thoracic 10/02/2011  . Essential hypertension 07/03/2011    Orientation RESPIRATION BLADDER Height & Weight     Self, Situation, Place  O2(currently on 15L HFNC) External catheter Weight: 63.6 kg Height:  5\' 6"  (167.6 cm)  BEHAVIORAL SYMPTOMS/MOOD NEUROLOGICAL BOWEL NUTRITION STATUS      Continent Diet  AMBULATORY STATUS COMMUNICATION OF NEEDS Skin   Limited  Assist Verbally Normal                       Personal Care Assistance Level of Assistance  Bathing, Dressing Bathing Assistance: Limited assistance   Dressing Assistance: Limited assistance     Functional Limitations Info             SPECIAL CARE FACTORS FREQUENCY  PT (By licensed PT), OT (By licensed OT)     PT Frequency: 5x/week OT Frequency: min 3x/week            Contractures Contractures Info: Not present    Additional Factors Info  Code Status, Allergies Code Status Info: DNR Allergies Info: Lexapro           Current Medications (10/15/2019):  This is the current hospital active medication list Current Facility-Administered Medications  Medication Dose Route Frequency Provider Last Rate Last Admin  . acetaminophen (TYLENOL) tablet 650 mg  650 mg Oral Q6H PRN Thurnell Lose, MD      . albuterol (VENTOLIN HFA) 108 (90 Base) MCG/ACT inhaler 2 puff  2 puff Inhalation Q6H PRN Thurnell Lose, MD      . ALPRAZolam Duanne Moron) tablet 0.25 mg  0.25 mg Oral BID PRN Thurnell Lose, MD   0.25 mg at 10/14/19 1718  . amiodarone (PACERONE) tablet 100 mg  100 mg Oral Daily Thurnell Lose, MD   100 mg at 10/15/19 1038  . apixaban (ELIQUIS) tablet 5 mg  5 mg Oral BID Harvel Quale, RPH   5 mg at 10/15/19 1038  . bisacodyl (DULCOLAX) EC tablet 5 mg  5 mg Oral Daily PRN Thurnell Lose, MD      . Chlorhexidine Gluconate Cloth 2 % PADS 6 each  6 each Topical Daily Adefeso, Oladapo, DO   6 each at 10/15/19 1039  . dextromethorphan-guaiFENesin (MUCINEX DM) 30-600 MG per 12 hr tablet 2 tablet  2 tablet Oral Q12H Thurnell Lose, MD   2 tablet at 10/15/19 0530  . diltiazem (CARDIZEM) tablet 60 mg  60 mg Oral Q12H Thurnell Lose, MD   60 mg at 10/15/19 1040  . MEDLINE mouth rinse  15 mL Mouth Rinse BID Thurnell Lose, MD   15 mL at 10/15/19 1039  . methylPREDNISolone sodium succinate (SOLU-MEDROL) 40 mg/mL injection 40 mg  40 mg Intravenous Q12H Thurnell Lose, MD   40 mg at 10/15/19 0530  . ondansetron (ZOFRAN) injection 4 mg  4 mg Intravenous Q6H PRN Thurnell Lose, MD      . pantoprazole (PROTONIX) EC tablet 40 mg  40 mg Oral Daily Thurnell Lose, MD   40 mg at 10/15/19 1038  . remdesivir 100 mg in sodium chloride 0.9 % 100 mL IVPB  100 mg Intravenous Daily Lala Lund K, MD 200 mL/hr at 10/14/19 1003 100 mg at 10/14/19 1003  . sodium chloride (OCEAN) 0.65 % nasal spray 1 spray  1 spray Each Nare PRN Thurnell Lose, MD   1 spray at 10/14/19 0955  . sodium chloride flush (NS) 0.9 % injection 3 mL  3 mL Intravenous Q12H Thurnell Lose, MD   3 mL at 10/15/19 1039  . traZODone (DESYREL) tablet 25 mg  25 mg Oral QHS Adefeso, Oladapo, DO   25 mg at 10/14/19 2334     Discharge Medications: Please see discharge summary for a list of discharge medications.  Relevant Imaging Results:  Relevant Lab Results:   Additional Information SSN: [id number]  Ninfa Meeker, RN

## 2019-10-16 LAB — COMPREHENSIVE METABOLIC PANEL
ALT: 18 U/L (ref 0–44)
AST: 20 U/L (ref 15–41)
Albumin: 3.1 g/dL — ABNORMAL LOW (ref 3.5–5.0)
Alkaline Phosphatase: 102 U/L (ref 38–126)
Anion gap: 13 (ref 5–15)
BUN: 41 mg/dL — ABNORMAL HIGH (ref 8–23)
CO2: 25 mmol/L (ref 22–32)
Calcium: 8.9 mg/dL (ref 8.9–10.3)
Chloride: 102 mmol/L (ref 98–111)
Creatinine, Ser: 0.65 mg/dL (ref 0.44–1.00)
GFR calc Af Amer: >60 mL/min (ref >60)
GFR calc non Af Amer: >60 mL/min (ref >60)
Glucose, Bld: 172 mg/dL — ABNORMAL HIGH (ref 70–99)
Potassium: 4 mmol/L (ref 3.5–5.1)
Sodium: 140 mmol/L (ref 135–145)
Total Bilirubin: 0.5 mg/dL (ref 0.3–1.2)
Total Protein: 6.1 g/dL — ABNORMAL LOW (ref 6.5–8.1)

## 2019-10-16 LAB — CBC WITH DIFFERENTIAL/PLATELET
Abs Immature Granulocytes: 0.12 10*3/uL — ABNORMAL HIGH (ref 0.00–0.07)
Basophils Absolute: 0 10*3/uL (ref 0.0–0.1)
Basophils Relative: 0 %
Eosinophils Absolute: 0 10*3/uL (ref 0.0–0.5)
Eosinophils Relative: 0 %
HCT: 38.9 % (ref 36.0–46.0)
Hemoglobin: 12.5 g/dL (ref 12.0–15.0)
Immature Granulocytes: 1 %
Lymphocytes Relative: 3 %
Lymphs Abs: 0.3 10*3/uL — ABNORMAL LOW (ref 0.7–4.0)
MCH: 28.6 pg (ref 26.0–34.0)
MCHC: 32.1 g/dL (ref 30.0–36.0)
MCV: 89 fL (ref 80.0–100.0)
Monocytes Absolute: 0.2 10*3/uL (ref 0.1–1.0)
Monocytes Relative: 3 %
Neutro Abs: 7.9 10*3/uL — ABNORMAL HIGH (ref 1.7–7.7)
Neutrophils Relative %: 93 %
Platelets: 220 10*3/uL (ref 150–400)
RBC: 4.37 MIL/uL (ref 3.87–5.11)
RDW: 12.7 % (ref 11.5–15.5)
WBC: 8.6 10*3/uL (ref 4.0–10.5)
nRBC: 0 % (ref 0.0–0.2)

## 2019-10-16 LAB — MAGNESIUM: Magnesium: 2.4 mg/dL (ref 1.7–2.4)

## 2019-10-16 LAB — D-DIMER, QUANTITATIVE: D-Dimer, Quant: 10.97 ug/mL-FEU — ABNORMAL HIGH (ref 0.00–0.50)

## 2019-10-16 LAB — C-REACTIVE PROTEIN: CRP: 1.2 mg/dL — ABNORMAL HIGH (ref <1.0)

## 2019-10-16 LAB — BRAIN NATRIURETIC PEPTIDE: B Natriuretic Peptide: 65.1 pg/mL (ref 0.0–100.0)

## 2019-10-16 MED ORDER — PRO-STAT SUGAR FREE PO LIQD
30.0000 mL | Freq: Three times a day (TID) | ORAL | Status: DC
  Filled 2019-10-16 (×4): qty 30

## 2019-10-16 MED ORDER — DILTIAZEM HCL-DEXTROSE 125-5 MG/125ML-% IV SOLN (PREMIX)
5.0000 mg/h | INTRAVENOUS | Status: DC
  Administered 2019-10-16: 5 mg/h via INTRAVENOUS
  Administered 2019-10-17: 04:00:00 12 mg/h via INTRAVENOUS
  Filled 2019-10-16 (×3): qty 125

## 2019-10-16 MED ORDER — DILTIAZEM LOAD VIA INFUSION
10.0000 mg | Freq: Once | INTRAVENOUS | Status: AC
  Administered 2019-10-16: 15 mg via INTRAVENOUS
  Filled 2019-10-16: qty 10

## 2019-10-16 MED ORDER — FUROSEMIDE 10 MG/ML IJ SOLN
40.0000 mg | Freq: Once | INTRAMUSCULAR | Status: AC
  Administered 2019-10-16: 40 mg via INTRAVENOUS
  Filled 2019-10-16: qty 4

## 2019-10-16 MED ORDER — METHYLPREDNISOLONE SODIUM SUCC 40 MG IJ SOLR
40.0000 mg | INTRAMUSCULAR | Status: DC
  Administered 2019-10-17: 40 mg via INTRAVENOUS
  Filled 2019-10-16: qty 1

## 2019-10-16 MED ORDER — DIGOXIN 0.25 MG/ML IJ SOLN
0.1250 mg | Freq: Four times a day (QID) | INTRAMUSCULAR | Status: DC
  Administered 2019-10-16: 0.125 mg via INTRAVENOUS
  Filled 2019-10-16 (×2): qty 0.5

## 2019-10-16 MED FILL — Dextrose Inj 5%: INTRAVENOUS | Qty: 100 | Status: AC

## 2019-10-16 MED FILL — Diltiazem HCl IV Soln 125 MG/25ML (5 MG/ML): INTRAVENOUS | Qty: 125 | Status: AC

## 2019-10-16 NOTE — Progress Notes (Signed)
MD at bedside. card

## 2019-10-16 NOTE — Progress Notes (Signed)
Cardizem gtt increased to 63ml/hr with hr maintaining in 140s. Dig IV 0.125 IVP given per Dr Candiss Norse orders.l

## 2019-10-16 NOTE — Progress Notes (Signed)
Pt resting quietly with no distress noted. Pt remained cooperative and verbal throughout.tele SR with hr 90s.

## 2019-10-16 NOTE — Progress Notes (Signed)
PT remains on NRB, and pt is becoming increasingly confused, but answers simple questions appropriately. Cardizem gtt remains at 10mg /hr. Pt is anxious intermittently but easily calmed. Will give report to PM nurse.

## 2019-10-16 NOTE — Plan of Care (Signed)
  Problem: Education: Goal: Knowledge of risk factors and measures for prevention of condition will improve Outcome: Progressing   Problem: Coping: Goal: Psychosocial and spiritual needs will be supported Outcome: Progressing   Problem: Respiratory: Goal: Will maintain a patent airway Outcome: Progressing Goal: Complications related to the disease process, condition or treatment will be avoided or minimized Outcome: Progressing   

## 2019-10-16 NOTE — Progress Notes (Signed)
PROGRESS NOTE                                                                                                                                                                                                             Patient Demographics:    Elaine Price, is a 84 y.o. female, DOB - 05/25/1935, ZOX:[id number]  Outpatient Primary MD for the patient is McLean-Scocuzza, Nino Glow, MD    LOS - 4  Admit date - 11/04/2019    CC - SOB     Brief Narrative  -  Elaine Price  is a 84 y.o. female, with history significant for breast cancer, essential hypertension, dyslipidemia, anemia of chronic disease, diverticulosis without diverticulitis, recent admission for pneumonia.  Patient otherwise is quite healthy and independent still lives alone and drives, has been sick for around a month now.  Was recently admitted for pneumonia to [facility] and discharged few weeks ago, she was diagnosed recently in the outpatient setting with COVID-19 infection 2 days prior to her ER visit, her daughter also tested positive.  In the [facility] ER she was diagnosed with acute hypoxic respiratory failure due to COVID-19 pneumonitis, she was placed on a nonrebreather mask, she was started on IV steroids and remdesivir and transferred here to [facility] for further care.  Patient currently has only shortness of breath and a dry cough as her complaints, denies any headache, no chest or abdominal pain, no productive phlegm, is short of breath, shortness of breath is worse with exertion better with rest, no other subjective complaints.    Subjective:   Patient in bed, appears comfortable, denies any headache, no fever, no chest pain or pressure, +ve shortness of breath , no abdominal pain. No focal weakness.   Assessment  & Plan :     1. Acute Hypoxic Resp. Failure due to Acute Covid 19 Viral Pneumonitis during the ongoing 2020 Covid 19 Pandemic - She had  severe Hypoxia upon arrival from [facility], she was on nonrebreather.  She received IV steroids and remdesivir at [facility] for the first day of her hospital stay, she was getting worse rapidly and had to be given supplemental nasal cannula oxygen on top of nonrebreather.  Upon arrival she received IV Actemra and since then she has shown improvement and she is now on nonrebreather mask with rate  of flow around 10 L - 15L  with Pox 95%.   Encouraged the patient to sit up in chair in the daytime use I-S and flutter valve for pulmonary toiletry and then prone in bed when at night.  We will continue to titrate the oxygen down, problem is she is not tolerating nasal cannula and keeps on pulling it.  Have added little Seroquel as well for anxiety and delirium.     SpO2: 94 % O2 Flow Rate (L/min): 15 L/min FiO2 (%): 100 %  Recent Labs  Lab 10/18/2019 0513 10/13/19 0320 10/14/19 0230 10/15/19 0215 10/16/19 0040  CRP 8.7* 8.1* 3.8* 2.0* 1.2*  DDIMER  --  3.55* 6.37* 9.10* 10.97*  FERRITIN 481*  --   --   --   --   BNP  --  86.1 224.8* 91.1 65.1  PROCALCITON <0.10  --   --   --   --     Hepatic Function Latest Ref Rng & Units 10/16/2019 10/15/2019 10/14/2019  Total Protein 6.5 - 8.1 g/dL 6.1(L) 6.2(L) 5.9(L)  Albumin 3.5 - 5.0 g/dL 3.1(L) 3.1(L) 2.8(L)  AST 15 - 41 U/L _0 ALT 0 - 44 U/L _1 Alk Phosphatase 38 - 126 U/L 102 93 76  Total Bilirubin 0.3 - 1.2 mg/dL 0.5 0.5 0.5  Bilirubin, Direct 0.0 - 0.2 mg/dL - - -      2.  Elevated D-dimer due to inflammation.  CTA is negative but D-dimer is rising despite being on Eliquis, has been switched to full dose Lovenox on 10/15/2019.  3.  New onset A. fib RVR.  Due to pulmonary stress.  Stable TSH, initially was hypotensive hence IV amiodarone was needed, also received 2 doses of IV digoxin, IV fluids given as well.  Blood pressure has improved.  Will be placed on oral amiodarone along with Cardizem.  Mali vas 2 score is  at least 3 hence Eliquis/full dose Lovenox started.  Echo stable EF 60%.  4.  GERD.  On PPI.  5.  Mild acute on chr. Grade 1 Diastolic CHF EF 79% -  Mild acute failure, IV Lasix will be repeated again on 10/16/2019.  6.  Anxiety and delirium.  Low-dose Seroquel added on 10/15/2019.  Became more confused with minimal Xanax which was given for extreme anxiety.  Hold Xanax.   Condition -  Guarded  Family Communication  : daughter updated 10/13/2019, will call 12/9, 12/10, 12/11  Code Status :  DNR  Diet :    Diet Order            DIET SOFT Room service appropriate? Yes; Fluid consistency: Thin  Diet effective now               Disposition Plan  : ICU >> PCU  Consults  :    Procedures  :     CTA - No PE  TTE    1. Left ventricular ejection fraction, by visual estimation, is 55 to 60%. The left ventricle has normal function. There is no left ventricular hypertrophy.  2. Left ventricular diastolic parameters are consistent with Grade I diastolic dysfunction (impaired relaxation).  3. The left ventricle has no regional wall motion abnormalities.  4. Global right ventricle has normal systolic function.The right ventricular size is normal. No increase in right ventricular wall thickness.  5. Left atrial size was normal.  6. Right atrial size was normal.  7. The mitral valve is normal in structure.  Trace mitral valve regurgitation. No evidence of mitral stenosis.  8. The tricuspid valve is normal in structure. Tricuspid valve regurgitation is not demonstrated.  9. The aortic valve is tricuspid. Aortic valve regurgitation is mild. Mild aortic valve sclerosis without stenosis. 10. TR signal is inadequate for assessing pulmonary artery systolic pressure. 11. The inferior vena cava is normal in size with greater than 50% respiratory variability, suggesting right atrial pressure of 3 mmHg.   PUD Prophylaxis : PPI  DVT Prophylaxis  :  Lovenox >> Eliquis  Lab Results  Component  Value Date   PLT 220 10/16/2019    Inpatient Medications  Scheduled Meds:  amiodarone  100 mg Oral Daily   Chlorhexidine Gluconate Cloth  6 each Topical Daily   dextromethorphan-guaiFENesin  2 tablet Oral Q12H   diltiazem  60 mg Oral Q12H   enoxaparin (LOVENOX) injection  60 mg Subcutaneous Q12H   feeding supplement (PRO-STAT SUGAR FREE 64)  30 mL Oral TID WC   furosemide  40 mg Intravenous Once   mouth rinse  15 mL Mouth Rinse BID   [START ON 10/17/2019] methylPREDNISolone (SOLU-MEDROL) injection  40 mg Intravenous Q24H   pantoprazole  40 mg Oral Daily   QUEtiapine  12.5 mg Oral BID   sodium chloride flush  3 mL Intravenous Q12H   traZODone  25 mg Oral QHS   Continuous Infusions:  remdesivir 100 mg in NS 100 mL 100 mg (10/16/19 0904)   PRN Meds:.acetaminophen, albuterol, bisacodyl, [DISCONTINUED] ondansetron **OR** ondansetron (ZOFRAN) IV, sodium chloride  Antibiotics  :    Anti-infectives (From admission, onward)   Start     Dose/Rate Route Frequency Ordered Stop   10/13/19 2215  remdesivir 100 mg in sodium chloride 0.9 % 100 mL IVPB     100 mg 200 mL/hr over 30 Minutes Intravenous Once 10/13/19 2158 10/13/19 2326   10/13/19 1000  remdesivir 100 mg in sodium chloride 0.9 % 100 mL IVPB  Status:  Discontinued     100 mg 200 mL/hr over 30 Minutes Intravenous Daily 10/18/2019 1631 10/24/2019 1641   10/13/19 0600  remdesivir 100 mg in sodium chloride 0.9 % 100 mL IVPB     100 mg 200 mL/hr over 30 Minutes Intravenous Daily 11/03/2019 1643 10/17/19 0959   10/16/2019 1645  remdesivir 200 mg in sodium chloride 0.9% 250 mL IVPB  Status:  Discontinued     200 mg 580 mL/hr over 30 Minutes Intravenous Once 10/15/2019 1631 11/05/2019 1641       Time Spent in minutes  30   Lala Lund M.D on 10/16/2019 at 10:39 AM  To page go to [email or web address] - password [facility]  [facility] -  Office  [phone]  See all Orders from today for further details    Objective:    Vitals:   10/16/19 0850 10/16/19 0856 10/16/19 0905 10/16/19 0910  BP:      Pulse:   (!) 110 98  Resp:    (!) 25  Temp:      TempSrc:      SpO2: (!) 89% 90% 92% 94%  Weight:      Height:        Wt Readings from Last 3 Encounters:  10/13/19 63.6 kg  10/29/2019 68.2 kg  10/06/19 68.2 kg     Intake/Output Summary (Last 24 hours) at 10/16/2019 1039 Last data filed at 10/16/2019 0908 Gross per 24 hour  Intake 613 ml  Output 800 ml  Net -187 ml  Physical Exam  Awake Alert, mildy anxious [place].AT,PERRAL Supple Neck,No JVD, No cervical lymphadenopathy appriciated.  Symmetrical Chest wall movement, Good air movement bilaterally, few rales RRR,No Gallops, Rubs or new Murmurs, No Parasternal Heave +ve B.Sounds, Abd Soft, No tenderness, No organomegaly appriciated, No rebound - guarding or rigidity. No Cyanosis, Clubbing or edema, No new Rash or bruise    Data Review:    CBC Recent Labs  Lab 10/20/2019 0513 10/13/19 0320 10/14/19 0230 10/15/19 0215 10/16/19 0040  WBC 7.2 3.2* 7.6 8.3 8.6  HGB 11.9* 10.8* 11.3* 12.3 12.5  HCT 36.7 33.8* 36.7 38.9 38.9  PLT 264 256 219 221 220  MCV 88.2 89.7 92.4 90.9 89.0  MCH 28.6 28.6 28.5 28.7 28.6  MCHC 32.4 32.0 30.8 31.6 32.1  RDW 12.9 12.8 12.9 13.0 12.7  LYMPHSABS 0.4* 0.4* 0.4* 0.2* 0.3*  MONOABS 0.2 0.1 0.1 0.1 0.2  EOSABS 0.0 0.0 0.0 0.0 0.0  BASOSABS 0.0 0.0 0.0 0.0 0.0    Chemistries  Recent Labs  Lab 10/10/2019 0513 10/13/19 0320 10/14/19 0230 10/15/19 0215 10/16/19 0040  NA 137 139 139 140 140  K 4.0 4.4 4.7 4.3 4.0  CL 100 103 104 103 102  CO2 _0 GLUCOSE 94 139* 145* 146* 172*  BUN 21 27* 33* 41* 41*  CREATININE 0.68 0.59 0.69 0.71 0.65  CALCIUM 8.7* 8.5* 8.7* 8.8* 8.9  MG  --  2.4 2.3 2.5* 2.4  AST _1 ALT _2 ALKPHOS 75 70 76 93 102  BILITOT 0.6 1.0 0.5 0.5 0.5    ------------------------------------------------------------------------------------------------------------------ No results for input(s): CHOL, HDL, LDLCALC, TRIG, CHOLHDL, LDLDIRECT in the last 72 hours.  Lab Results  Component Value Date   HGBA1C 5.9 09/11/2019   ------------------------------------------------------------------------------------------------------------------ No results for input(s): TSH, T4TOTAL, T3FREE, THYROIDAB in the last 72 hours.  Invalid input(s): FREET3  Cardiac Enzymes No results for input(s): CKMB, TROPONINI, MYOGLOBIN in the last 168 hours.  Invalid input(s): CK ------------------------------------------------------------------------------------------------------------------    Component Value Date/Time   BNP 65.1 10/16/2019 0040    Micro Results Recent Results (from the past 240 hour(s))  Blood Culture (routine x 2)     Status: None (Preliminary result)   Collection Time: 10/29/2019  5:29 AM   Specimen: BLOOD  Result Value Ref Range Status   Specimen Description BLOOD LEFT AC  Final   Special Requests   Final    BOTTLES DRAWN AEROBIC AND ANAEROBIC Blood Culture adequate volume   Culture   Final    NO GROWTH 4 DAYS Performed at [facility], [address] [place], [place] [address]    Report Status PENDING  Incomplete  Blood Culture (routine x 2)     Status: None (Preliminary result)   Collection Time: 10/31/2019  5:29 AM   Specimen: BLOOD  Result Value Ref Range Status   Specimen Description BLOOD RIGHT WRIST  Final   Special Requests   Final    BOTTLES DRAWN AEROBIC ONLY Blood Culture results may not be optimal due to an inadequate volume of blood received in culture bottles   Culture   Final    NO GROWTH 4 DAYS Performed at [facility], [address], [place], [place] [address]    Report Status PENDING  Incomplete  MRSA PCR Screening     Status: None   Collection Time: 10/13/19  5:40 AM   Specimen:  Nasopharyngeal  Result Value Ref  Range Status   MRSA by PCR NEGATIVE NEGATIVE Final    Comment:        The GeneXpert MRSA Assay (FDA approved for NASAL specimens only), is one component of a comprehensive MRSA colonization surveillance program. It is not intended to diagnose MRSA infection nor to guide or monitor treatment for MRSA infections. Performed at [facility], [place] [address] [place],  [address]     Radiology Reports DG Chest 1 View  Result Date: 10/17/2019 CLINICAL DATA:  Shortness of breath.  COVID-19 positive EXAM: CHEST  1 VIEW COMPARISON:  09/26/2019 FINDINGS: Bilateral pulmonary infiltrate. Infiltrates were also seen November 2020, but opacity is denser and more widespread today. No edema, effusion, or pneumothorax. Normal heart size. No acute osseous finding. IMPRESSION: Bilateral pneumonia. Electronically Signed   By: Monte Fantasia M.D.   On: 10/24/2019 05:32   CT ANGIO CHEST PE W OR WO CONTRAST  Result Date: 09/27/2019 CLINICAL DATA:  Multiple falls and feeling fatigued. PE suspected. EXAM: CT ANGIOGRAPHY CHEST WITH CONTRAST TECHNIQUE: Multidetector CT imaging of the chest was performed using the standard protocol during bolus administration of intravenous contrast. Multiplanar CT image reconstructions and MIPs were obtained to evaluate the vascular anatomy. CONTRAST:  41m OMNIPAQUE IOHEXOL 350 MG/ML SOLN COMPARISON:  None. FINDINGS: Cardiovascular: Heart is enlarged. No pericardial effusion. Coronary artery calcification is evident. Atherosclerotic calcification is noted in the wall of the thoracic aorta. No filling defect within the opacified pulmonary arteries to suggest the presence of an acute pulmonary embolus. Mediastinum/Nodes: Mediastinal lymph nodes measure up to 8 mm short axis in the pre-vascular window. 9 mm short axis subcarinal lymph node. The esophagus has normal imaging features. Surgical clips are noted in the left axilla.  Lungs/Pleura: Lungs demonstrate bilateral patchy ground-glass infiltrates peripherally in both lungs with an upper lung predominance. No pleural effusion. Upper Abdomen: 2.6 cm low-density lesion in the left liver approaches water attenuation and is compatible with a cyst. Musculoskeletal: No worrisome lytic or sclerotic osseous abnormality. Review of the MIP images confirms the above findings. IMPRESSION: 1. No CT evidence for acute pulmonary embolus. 2. Bilateral patchy ground-glass infiltrates peripherally in both lungs with an upper lung predominance. Imaging features are nonspecific but likely related to infectious/inflammatory etiology and atypical or viral pneumonia would be a consideration. 3. Borderline mediastinal lymphadenopathy, likely reactive. 4. Aortic Atherosclerosis (ICD10-I70.0). Electronically Signed   By: EMisty StanleyM.D.   On: 09/27/2019 07:35   DG Chest Port 1 View  Result Date: 09/26/2019 CLINICAL DATA:  Low-grade fever. EXAM: PORTABLE CHEST 1 VIEW COMPARISON:  10/24/2016 FINDINGS: 1300 hours. The cardiopericardial silhouette is within normal limits for size. Interstitial markings are diffusely coarsened with chronic features. Patchy subtle airspace opacities are seen peripherally in both lungs. No associated pleural effusion period bones are demineralized. IMPRESSION: 1. Patchy subtle airspace opacities in the periphery of both lungs, left greater than right, without pleural effusion. Imaging features compatible with multifocal pneumonia and atypical/viral etiology should be considered. I discussed these findings by telephone with Dr. QJacqualine Codeat approximately 13 20 on 09/26/2019. Electronically Signed   By: EMisty StanleyM.D.   On: 09/26/2019 13:23   ECHOCARDIOGRAM COMPLETE  Result Date: 10/13/2019   ECHOCARDIOGRAM REPORT   [facility]   [facility]2019 Medical Rec #:  [id number]              Height:       66.0 in Accession #:  [id number]               Weight:       140.3 lb Date of Birth:  29-Dec-1934               BSA:          1.72 m Patient Age:    14 years                BP:           113/54 mmHg Patient Gender: F                       HR:           69 bpm. Exam Location:  Inpatient Procedure: 2D Echo, Color Doppler and Cardiac Doppler Indications:    I48.91* Unspecified atrial fibrillation  History:        Patient has prior history of Echocardiogram examinations, most                 recent 04/23/2018. Risk Factors:Hypertension and Dyslipidemia.                 Active COVID-19 Infection at time of study.  Sonographer:    Raquel Sarna Senior RDCS Referring Phys: [place]  1. Left ventricular ejection fraction, by visual estimation, is 55 to 60%. The left ventricle has normal function. There is no left ventricular hypertrophy.  2. Left ventricular diastolic parameters are consistent with Grade I diastolic dysfunction (impaired relaxation).  3. The left ventricle has no regional wall motion abnormalities.  4. Global right ventricle has normal systolic function.The right ventricular size is normal. No increase in right ventricular wall thickness.  5. Left atrial size was normal.  6. Right atrial size was normal.  7. The mitral valve is normal in structure. Trace mitral valve regurgitation. No evidence of mitral stenosis.  8. The tricuspid valve is normal in structure. Tricuspid valve regurgitation is not demonstrated.  9. The aortic valve is tricuspid. Aortic valve regurgitation is mild. Mild aortic valve sclerosis without stenosis. 10. TR signal is inadequate for assessing pulmonary artery systolic pressure. 11. The inferior vena cava is normal in size with greater than 50% respiratory variability, suggesting right atrial pressure of 3 mmHg. FINDINGS  Left Ventricle: Left ventricular ejection fraction, by visual estimation, is 55 to 60%. The left ventricle has normal function. The left ventricle has no regional wall motion abnormalities.  There is no left ventricular hypertrophy. Left ventricular diastolic parameters are consistent with Grade I diastolic dysfunction (impaired relaxation). Right Ventricle: The right ventricular size is normal. No increase in right ventricular wall thickness. Global RV systolic function is has normal systolic function. Left Atrium: Left atrial size was normal in size. Right Atrium: Right atrial size was normal in size Pericardium: There is no evidence of pericardial effusion. Mitral Valve: The mitral valve is normal in structure. No evidence of mitral valve stenosis by observation. Trace mitral valve regurgitation. Tricuspid Valve: The tricuspid valve is normal in structure. Tricuspid valve regurgitation is not demonstrated. Aortic Valve: The aortic valve is tricuspid. Aortic valve regurgitation is mild. Mild aortic valve sclerosis is present, with no evidence of aortic valve stenosis. Pulmonic Valve: The pulmonic valve was normal in structure. Pulmonic valve regurgitation is not visualized. Aorta: The aortic root is normal in size and structure. Venous: The inferior vena cava is normal in size with greater than 50% respiratory variability, suggesting right atrial pressure  of 3 mmHg. IAS/Shunts: No atrial level shunt detected by color flow Doppler.  LEFT VENTRICLE PLAX 2D LVIDd:         4.35 cm  Diastology LVIDs:         3.17 cm  LV e' lateral:   6.38 cm/s LV PW:         0.98 cm  LV E/e' lateral: 7.3 LV IVS:        0.79 cm  LV e' medial:    4.06 cm/s LVOT diam:     1.80 cm  LV E/e' medial:  11.5 LV SV:         45 ml LV SV Index:   26.28 LVOT Area:     2.54 cm  RIGHT VENTRICLE RV S prime:     13.20 cm/s TAPSE (M-mode): 2.4 cm LEFT ATRIUM             Index       RIGHT ATRIUM           Index LA diam:        2.70 cm 1.57 cm/m  RA Area:     15.50 cm LA Vol (A2C):   41.8 ml 24.30 ml/m RA Volume:   39.40 ml  22.91 ml/m LA Vol (A4C):   27.0 ml 15.70 ml/m LA Biplane Vol: 36.6 ml 21.28 ml/m  AORTIC VALVE LVOT Vmax:    90.90 cm/s LVOT Vmean:  59.400 cm/s LVOT VTI:    0.177 m  AORTA Ao Root diam: 2.80 cm MITRAL VALVE MV Area (PHT): 2.37 cm             SHUNTS MV PHT:        92.80 msec           Systemic VTI:  0.18 m MV Decel Time: 320 msec             Systemic Diam: 1.80 cm MV E velocity: 46.60 cm/s 103 cm/s MV A velocity: 82.90 cm/s 70.3 cm/s MV E/A ratio:  0.56       1.5  Loralie Champagne MD Electronically signed by Loralie Champagne MD Signature Date/Time: 10/13/2019/3:50:44 PM    Final

## 2019-10-16 NOTE — Progress Notes (Signed)
Cardizem gtt started at 74ml/hr. HR 130s. Bolus cardizem given from bag per MD Candiss Norse.

## 2019-10-16 NOTE — Progress Notes (Signed)
Pt c/o neck pain, gave warm compress to posterior neck. Unable to wean pt down from HFNC 15L or nonrebreather. Sats remaining at 90-93% Pt seems relaxed.

## 2019-10-16 NOTE — Progress Notes (Signed)
Sent message to MD to inform of pts hr 110s to 160s. Pt resting in recliner.

## 2019-10-16 NOTE — Progress Notes (Signed)
Pt was found lethargic. ICU RN called to bedside to examine pt. Pt did receive Seroquel dosage over night. First dose was given 12/10 @ 1247. RN explains that lethargy can be caused by the Seroquel given. No change in orientation. VSS. No other signs of distress noted. Vanita Ingles, MD paged to make aware. No new orders received. Will continue to monitor.

## 2019-10-16 NOTE — Progress Notes (Signed)
Pt somewhat anxious and asking to go home. RR 27, 100% NRB&HFNC, hr 140s. Cardizem gtt increased to 10mg /hr. Reassured pt and comforted pt.

## 2019-10-16 NOTE — Progress Notes (Signed)
Pt now on nonrebreather only. HFNC is off at this time. Pt is resting comfortably. Will continue to monitor.

## 2019-10-16 NOTE — Progress Notes (Signed)
Pt is now sitting up in recliner and sats remaining around 83-836% on HFNC & nonrebreather per MD orders. Pt is tachypneic w/RR at 21-25. Having pt relax and deep breathe. Changed finger probe and ear probe but sats still same. Pt able to verbalize needs and wants.. VSS stable exception sats. Will continue to monitor.

## 2019-10-16 NOTE — Plan of Care (Signed)
  Problem: Education: Goal: Knowledge of risk factors and measures for prevention of condition will improve Outcome: Progressing   Problem: Coping: Goal: Psychosocial and spiritual needs will be supported Outcome: Progressing   Problem: Respiratory: Goal: Complications related to the disease process, condition or treatment will be avoided or minimized Outcome: Progressing   

## 2019-10-16 NOTE — Progress Notes (Signed)
Pt more relaxed and sats on HFNC and nonrebreather is 90%

## 2019-10-16 NOTE — Progress Notes (Signed)
Pt on 6L HFNC and nonrebreather 7L. Sats  93%

## 2019-10-17 LAB — COMPREHENSIVE METABOLIC PANEL
ALT: 19 U/L (ref 0–44)
AST: 23 U/L (ref 15–41)
Albumin: 3 g/dL — ABNORMAL LOW (ref 3.5–5.0)
Alkaline Phosphatase: 102 U/L (ref 38–126)
Anion gap: 13 (ref 5–15)
BUN: 46 mg/dL — ABNORMAL HIGH (ref 8–23)
CO2: 28 mmol/L (ref 22–32)
Calcium: 8.9 mg/dL (ref 8.9–10.3)
Chloride: 102 mmol/L (ref 98–111)
Creatinine, Ser: 0.69 mg/dL (ref 0.44–1.00)
GFR calc Af Amer: >60 mL/min (ref >60)
GFR calc non Af Amer: >60 mL/min (ref >60)
Glucose, Bld: 190 mg/dL — ABNORMAL HIGH (ref 70–99)
Potassium: 4.4 mmol/L (ref 3.5–5.1)
Sodium: 143 mmol/L (ref 135–145)
Total Bilirubin: 0.4 mg/dL (ref 0.3–1.2)
Total Protein: 5.8 g/dL — ABNORMAL LOW (ref 6.5–8.1)

## 2019-10-17 LAB — CBC WITH DIFFERENTIAL/PLATELET
Abs Immature Granulocytes: 0.13 10*3/uL — ABNORMAL HIGH (ref 0.00–0.07)
Basophils Absolute: 0 10*3/uL (ref 0.0–0.1)
Basophils Relative: 0 %
Eosinophils Absolute: 0 10*3/uL (ref 0.0–0.5)
Eosinophils Relative: 0 %
HCT: 37.9 % (ref 36.0–46.0)
Hemoglobin: 12 g/dL (ref 12.0–15.0)
Immature Granulocytes: 2 %
Lymphocytes Relative: 3 %
Lymphs Abs: 0.2 10*3/uL — ABNORMAL LOW (ref 0.7–4.0)
MCH: 28.5 pg (ref 26.0–34.0)
MCHC: 31.7 g/dL (ref 30.0–36.0)
MCV: 90 fL (ref 80.0–100.0)
Monocytes Absolute: 0.2 10*3/uL (ref 0.1–1.0)
Monocytes Relative: 3 %
Neutro Abs: 7.3 10*3/uL (ref 1.7–7.7)
Neutrophils Relative %: 92 %
Platelets: 228 10*3/uL (ref 150–400)
RBC: 4.21 MIL/uL (ref 3.87–5.11)
RDW: 12.9 % (ref 11.5–15.5)
WBC: 7.9 10*3/uL (ref 4.0–10.5)
nRBC: 0 % (ref 0.0–0.2)

## 2019-10-17 LAB — CULTURE, BLOOD (ROUTINE X 2)
Culture: NO GROWTH
Culture: NO GROWTH
Special Requests: ADEQUATE

## 2019-10-17 LAB — C-REACTIVE PROTEIN: CRP: 0.8 mg/dL (ref <1.0)

## 2019-10-17 LAB — D-DIMER, QUANTITATIVE: D-Dimer, Quant: 8.44 ug/mL-FEU — ABNORMAL HIGH (ref 0.00–0.50)

## 2019-10-17 LAB — BRAIN NATRIURETIC PEPTIDE: B Natriuretic Peptide: 70.8 pg/mL (ref 0.0–100.0)

## 2019-10-17 LAB — MAGNESIUM: Magnesium: 2.5 mg/dL — ABNORMAL HIGH (ref 1.7–2.4)

## 2019-10-17 MED ORDER — DILTIAZEM HCL 90 MG PO TABS
90.0000 mg | ORAL_TABLET | Freq: Three times a day (TID) | ORAL | Status: DC
  Administered 2019-10-17 – 2019-10-19 (×6): 90 mg via ORAL
  Filled 2019-10-17 (×19): qty 1

## 2019-10-17 MED ORDER — DILTIAZEM HCL 30 MG PO TABS
30.0000 mg | ORAL_TABLET | ORAL | Status: AC
  Administered 2019-10-17: 30 mg via ORAL
  Filled 2019-10-17: qty 1

## 2019-10-17 MED ORDER — FUROSEMIDE 10 MG/ML IJ SOLN
20.0000 mg | Freq: Once | INTRAMUSCULAR | Status: AC
  Administered 2019-10-17: 20 mg via INTRAVENOUS
  Filled 2019-10-17: qty 2

## 2019-10-17 MED ORDER — METHYLPREDNISOLONE SODIUM SUCC 40 MG IJ SOLR
30.0000 mg | INTRAMUSCULAR | Status: DC
  Administered 2019-10-18 – 2019-10-19 (×2): 30 mg via INTRAVENOUS
  Filled 2019-10-17 (×2): qty 1

## 2019-10-17 NOTE — Plan of Care (Addendum)
Patient up to chair majority of day. Patient continues on on 15L HFNC and15L non-rebreather sats 91% as last charted. Patient continues to have intermittent confusion, but is easily redirected. All medication given well tolerated. Spoke to daughter with update. Will continue to monitor for remainder of shift.  Problem: Education: Goal: Knowledge of risk factors and measures for prevention of condition will improve 10/17/2019 1114 by Orvan Falconer, RN Outcome: Progressing 10/17/2019 1114 by Orvan Falconer, RN Outcome: Progressing   Problem: Coping: Goal: Psychosocial and spiritual needs will be supported 10/17/2019 1114 by Orvan Falconer, RN Outcome: Progressing 10/17/2019 1114 by Orvan Falconer, RN Outcome: Progressing   Problem: Respiratory: Goal: Will maintain a patent airway 10/17/2019 1114 by Orvan Falconer, RN Outcome: Progressing 10/17/2019 1114 by Orvan Falconer, RN Outcome: Progressing Goal: Complications related to the disease process, condition or treatment will be avoided or minimized 10/17/2019 1114 by Orvan Falconer, RN Outcome: Progressing 10/17/2019 1114 by Orvan Falconer, RN Outcome: Progressing

## 2019-10-17 NOTE — Plan of Care (Signed)
  Problem: Coping: Goal: Psychosocial and spiritual needs will be supported Outcome: Progressing   Problem: Respiratory: Goal: Complications related to the disease process, condition or treatment will be avoided or minimized Outcome: Progressing

## 2019-10-17 NOTE — Progress Notes (Signed)
PROGRESS NOTE                                                                                                                                                                                                             Patient Demographics:    Elaine Price, is a 84 y.o. female, DOB - 22-May-1935, VQM:[id number]  Outpatient Primary MD for the patient is McLean-Scocuzza, Nino Glow, MD    LOS - 5  Admit date - 10/18/2019    CC - SOB     Brief Narrative  -  Elaine Price  is a 84 y.o. female, with history significant for breast cancer, essential hypertension, dyslipidemia, anemia of chronic disease, diverticulosis without diverticulitis, recent admission for pneumonia.  Patient otherwise is quite healthy and independent still lives alone and drives, has been sick for around a month now.  Was recently admitted for pneumonia to [facility] hospital and discharged few weeks ago, she was diagnosed recently in the outpatient setting with COVID-19 infection 2 days prior to her ER visit, her daughter also tested positive.  In the [place] ER she was diagnosed with acute hypoxic respiratory failure due to COVID-19 pneumonitis, she was placed on a nonrebreather mask, she was started on IV steroids and remdesivir and transferred here to [facility] for further care.  Patient currently has only shortness of breath and a dry cough as her complaints, denies any headache, no chest or abdominal pain, no productive phlegm, is short of breath, shortness of breath is worse with exertion better with rest, no other subjective complaints.    Subjective:   Patient in bed, appears comfortable, denies any headache, no fever, no chest pain or pressure, +ve shortness of breath , no abdominal pain. No focal weakness.   Assessment  & Plan :     1. Acute Hypoxic Resp. Failure due to Acute Covid 19 Viral Pneumonitis during the ongoing 2020 Covid 19 Pandemic - She had  severe Hypoxia upon arrival from [facility], she was on nonrebreather.  She received IV steroids and remdesivir at [facility] for the first day of her hospital stay, she was getting worse rapidly and had to be given supplemental nasal cannula oxygen on top of nonrebreather.  Upon arrival she received IV Actemra and since then she has shown improvement and she is now on nonrebreather mask with rate  of flow around 10 L - 15L  with Pox 95%.  Encouraged the patient to sit up in chair in the daytime use I-S and flutter valve for pulmonary toiletry and then prone in bed when at night.  We will continue to titrate the oxygen down, problem is she is not tolerating nasal cannula and keeps on pulling it.  Have added little Seroquel as well for anxiety and delirium.   SpO2: (!) 80 % O2 Flow Rate (L/min): 15 L/min FiO2 (%): 100 %  Recent Labs  Lab 10/24/2019 0513 10/13/19 0320 10/14/19 0230 10/15/19 0215 10/16/19 0040 10/17/19 0219  CRP 8.7* 8.1* 3.8* 2.0* 1.2* 0.8  DDIMER  --  3.55* 6.37* 9.10* 10.97* 8.44*  FERRITIN 481*  --   --   --   --   --   BNP  --  86.1 224.8* 91.1 65.1 70.8  PROCALCITON <0.10  --   --   --   --   --     Hepatic Function Latest Ref Rng & Units 10/17/2019 10/16/2019 10/15/2019  Total Protein 6.5 - 8.1 g/dL 5.8(L) 6.1(L) 6.2(L)  Albumin 3.5 - 5.0 g/dL 3.0(L) 3.1(L) 3.1(L)  AST 15 - 41 U/L '23 20 21  ' ALT 0 - 44 U/L '19 18 16  ' Alk Phosphatase 38 - 126 U/L 102 102 93  Total Bilirubin 0.3 - 1.2 mg/dL 0.4 0.5 0.5  Bilirubin, Direct 0.0 - 0.2 mg/dL - - -      2.  Elevated D-dimer due to inflammation.  CTA is negative but D-dimer is rising despite being on Eliquis, has been switched to full dose Lovenox on 10/15/2019.  3.  New onset A. fib RVR.  Due to pulmonary stress. Stable TSH and echocardiogram with EF 60%, currently on oral amiodarone and diltiazem, needed IV Cardizem drip again on 10/16/2019 which I will taper off on 10/17/2019, also received 2 doses of  digoxin on 10/16/2019.  Will try and titrate off Cardizem drip today.  Mali vas 2 score of at least 3.  Currently on Lovenox.  Once D-dimer starts trending down we will switch her back to Eliquis.  4.  GERD.  On PPI.  5.  Mild acute on chr. Grade 1 Diastolic CHF EF 24% -  Mild acute failure, IV Lasix will be repeated again on 10/17/2019.  6.  Anxiety and delirium.  Low-dose Seroquel added on 10/15/2019.  Became more confused with minimal Xanax which was given for extreme anxiety.  Hold Xanax.   Condition -  Guarded  Family Communication  : daughter updated 10/13/2019, will call 12/9, 12/10, 12/11, 12/12  Code Status :  DNR  Diet :    Diet Order            DIET SOFT Room service appropriate? Yes; Fluid consistency: Thin  Diet effective now               Disposition Plan  : ICU >> PCU  Consults  :    Procedures  :     CTA - No PE  TTE    1. Left ventricular ejection fraction, by visual estimation, is 55 to 60%. The left ventricle has normal function. There is no left ventricular hypertrophy.  2. Left ventricular diastolic parameters are consistent with Grade I diastolic dysfunction (impaired relaxation).  3. The left ventricle has no regional wall motion abnormalities.  4. Global right ventricle has normal systolic function.The right ventricular size is normal. No increase in right ventricular wall thickness.  5. Left atrial size was normal.  6. Right atrial size was normal.  7. The mitral valve is normal in structure. Trace mitral valve regurgitation. No evidence of mitral stenosis.  8. The tricuspid valve is normal in structure. Tricuspid valve regurgitation is not demonstrated.  9. The aortic valve is tricuspid. Aortic valve regurgitation is mild. Mild aortic valve sclerosis without stenosis. 10. TR signal is inadequate for assessing pulmonary artery systolic pressure. 11. The inferior vena cava is normal in size with greater than 50% respiratory variability,  suggesting right atrial pressure of 3 mmHg.   PUD Prophylaxis : PPI  DVT Prophylaxis  :  Lovenox >> Eliquis  Lab Results  Component Value Date   PLT 228 10/17/2019    Inpatient Medications  Scheduled Meds: . amiodarone  100 mg Oral Daily  . Chlorhexidine Gluconate Cloth  6 each Topical Daily  . dextromethorphan-guaiFENesin  2 tablet Oral Q12H  . diltiazem  90 mg Oral Q8H  . enoxaparin (LOVENOX) injection  60 mg Subcutaneous Q12H  . feeding supplement (PRO-STAT SUGAR FREE 64)  30 mL Oral TID WC  . mouth rinse  15 mL Mouth Rinse BID  . methylPREDNISolone (SOLU-MEDROL) injection  40 mg Intravenous Q24H  . pantoprazole  40 mg Oral Daily  . QUEtiapine  12.5 mg Oral BID  . sodium chloride flush  3 mL Intravenous Q12H  . traZODone  25 mg Oral QHS   Continuous Infusions: . diltiazem (CARDIZEM) infusion 12 mg/hr (10/17/19 0422)   PRN Meds:.acetaminophen, albuterol, bisacodyl, [DISCONTINUED] ondansetron **OR** ondansetron (ZOFRAN) IV, sodium chloride  Antibiotics  :    Anti-infectives (From admission, onward)   Start     Dose/Rate Route Frequency Ordered Stop   10/13/19 2215  remdesivir 100 mg in sodium chloride 0.9 % 100 mL IVPB     100 mg 200 mL/hr over 30 Minutes Intravenous Once 10/13/19 2158 10/13/19 2326   10/13/19 1000  remdesivir 100 mg in sodium chloride 0.9 % 100 mL IVPB  Status:  Discontinued     100 mg 200 mL/hr over 30 Minutes Intravenous Daily 10/26/2019 1631 10/11/2019 1641   10/13/19 0600  remdesivir 100 mg in sodium chloride 0.9 % 100 mL IVPB  Status:  Discontinued     100 mg 200 mL/hr over 30 Minutes Intravenous Daily 10/28/2019 1643 10/16/19 1504   10/09/2019 1645  remdesivir 200 mg in sodium chloride 0.9% 250 mL IVPB  Status:  Discontinued     200 mg 580 mL/hr over 30 Minutes Intravenous Once 10/10/2019 1631 10/06/2019 1641       Time Spent in minutes  30   Lala Lund M.D on 10/17/2019 at 10:10 AM  To page go to [email or web address] - password [facility]  [facility] -  Office  [phone]  See all Orders from today for further details    Objective:   Vitals:   10/17/19 0000 10/17/19 0400 10/17/19 0744 10/17/19 0800  BP: (!) 120/56  (!) 144/127 (!) 123/54  Pulse: 74 61 65 (!) 109  Resp: 20 19 (!) 22 20  Temp: (!) 97.3 F (36.3 C) (!) 97.5 F (36.4 C) 98.2 F (36.8 C)   TempSrc: Axillary Axillary Oral   SpO2: 95% 96% 95% (!) 80%  Weight:      Height:        Wt Readings from Last 3 Encounters:  10/13/19 63.6 kg  10/10/2019 68.2 kg  10/06/19 68.2 kg     Intake/Output Summary (Last 24 hours) at 10/17/2019 1010  Last data filed at 10/17/2019 0751 Gross per 24 hour  Intake 340.39 ml  Output 2000 ml  Net -1659.61 ml     Physical Exam  Awake Alert, mildy anxious [place].AT,PERRAL Supple Neck,No JVD, No cervical lymphadenopathy appriciated.  Symmetrical Chest wall movement, Good air movement bilaterally, few rales iRRR,No Gallops, Rubs or new Murmurs, No Parasternal Heave +ve B.Sounds, Abd Soft, No tenderness, No organomegaly appriciated, No rebound - guarding or rigidity. No Cyanosis, Clubbing or edema, No new Rash or bruise    Data Review:    CBC Recent Labs  Lab 10/13/19 0320 10/14/19 0230 10/15/19 0215 10/16/19 0040 10/17/19 0219  WBC 3.2* 7.6 8.3 8.6 7.9  HGB 10.8* 11.3* 12.3 12.5 12.0  HCT 33.8* 36.7 38.9 38.9 37.9  PLT 256 219 221 220 228  MCV 89.7 92.4 90.9 89.0 90.0  MCH 28.6 28.5 28.7 28.6 28.5  MCHC 32.0 30.8 31.6 32.1 31.7  RDW 12.8 12.9 13.0 12.7 12.9  LYMPHSABS 0.4* 0.4* 0.2* 0.3* 0.2*  MONOABS 0.1 0.1 0.1 0.2 0.2  EOSABS 0.0 0.0 0.0 0.0 0.0  BASOSABS 0.0 0.0 0.0 0.0 0.0    Chemistries  Recent Labs  Lab 10/13/19 0320 10/14/19 0230 10/15/19 0215 10/16/19 0040 10/17/19 0219  NA 139 139 140 140 143  K 4.4 4.7 4.3 4.0 4.4  CL 103 104 103 102 102  CO2 '25 26 24 25 28  ' GLUCOSE 139* 145* 146* 172* 190*  BUN 27* 33* 41* 41* 46*  CREATININE 0.59 0.69 0.71 0.65 0.69  CALCIUM 8.5* 8.7* 8.8*  8.9 8.9  MG 2.4 2.3 2.5* 2.4 2.5*  AST '18 20 21 20 23  ' ALT '16 15 16 18 19  ' ALKPHOS 70 76 93 102 102  BILITOT 1.0 0.5 0.5 0.5 0.4   ------------------------------------------------------------------------------------------------------------------ No results for input(s): CHOL, HDL, LDLCALC, TRIG, CHOLHDL, LDLDIRECT in the last 72 hours.  Lab Results  Component Value Date   HGBA1C 5.9 09/11/2019   ------------------------------------------------------------------------------------------------------------------ No results for input(s): TSH, T4TOTAL, T3FREE, THYROIDAB in the last 72 hours.  Invalid input(s): FREET3  Cardiac Enzymes No results for input(s): CKMB, TROPONINI, MYOGLOBIN in the last 168 hours.  Invalid input(s): CK ------------------------------------------------------------------------------------------------------------------    Component Value Date/Time   BNP 70.8 10/17/2019 0219    Micro Results Recent Results (from the past 240 hour(s))  Blood Culture (routine x 2)     Status: None   Collection Time: 10/30/2019  5:29 AM   Specimen: BLOOD  Result Value Ref Range Status   Specimen Description BLOOD LEFT [facility]  Final   Special Requests   Final    BOTTLES DRAWN AEROBIC AND ANAEROBIC Blood Culture adequate volume   Culture   Final    NO GROWTH 5 DAYS Performed at [facility], [address], [place], [place] [address]    Report Status 10/17/2019 FINAL  Final  Blood Culture (routine x 2)     Status: None   Collection Time: 11/04/2019  5:29 AM   Specimen: BLOOD  Result Value Ref Range Status   Specimen Description BLOOD RIGHT WRIST  Final   Special Requests   Final    BOTTLES DRAWN AEROBIC ONLY Blood Culture results may not be optimal due to an inadequate volume of blood received in culture bottles   Culture   Final    NO GROWTH 5 DAYS Performed at [facility], [address] [place], [place] [address]    Report Status 10/17/2019 FINAL   Final  MRSA PCR Screening  Status: None   Collection Time: 10/13/19  5:40 AM   Specimen: Nasopharyngeal  Result Value Ref Range Status   MRSA by PCR NEGATIVE NEGATIVE Final    Comment:        The GeneXpert MRSA Assay (FDA approved for NASAL specimens only), is one component of a comprehensive MRSA colonization surveillance program. It is not intended to diagnose MRSA infection nor to guide or monitor treatment for MRSA infections. Performed at [facility], [place] [address] [place], [place] [address]     Radiology Reports DG Chest 1 View  Result Date: 10/15/2019 CLINICAL DATA:  Shortness of breath.  COVID-19 positive EXAM: CHEST  1 VIEW COMPARISON:  09/26/2019 FINDINGS: Bilateral pulmonary infiltrate. Infiltrates were also seen November 2020, but opacity is denser and more widespread today. No edema, effusion, or pneumothorax. Normal heart size. No acute osseous finding. IMPRESSION: Bilateral pneumonia. Electronically Signed   By: Monte Fantasia M.D.   On: 10/30/2019 05:32   CT ANGIO CHEST PE W OR WO CONTRAST  Result Date: 09/27/2019 CLINICAL DATA:  Multiple falls and feeling fatigued. PE suspected. EXAM: CT ANGIOGRAPHY CHEST WITH CONTRAST TECHNIQUE: Multidetector CT imaging of the chest was performed using the standard protocol during bolus administration of intravenous contrast. Multiplanar CT image reconstructions and MIPs were obtained to evaluate the vascular anatomy. CONTRAST:  15m OMNIPAQUE IOHEXOL 350 MG/ML SOLN COMPARISON:  None. FINDINGS: Cardiovascular: Heart is enlarged. No pericardial effusion. Coronary artery calcification is evident. Atherosclerotic calcification is noted in the wall of the thoracic aorta. No filling defect within the opacified pulmonary arteries to suggest the presence of an acute pulmonary embolus. Mediastinum/Nodes: Mediastinal lymph nodes measure up to 8 mm short axis in the pre-vascular window. 9 mm short axis subcarinal lymph  node. The esophagus has normal imaging features. Surgical clips are noted in the left axilla. Lungs/Pleura: Lungs demonstrate bilateral patchy ground-glass infiltrates peripherally in both lungs with an upper lung predominance. No pleural effusion. Upper Abdomen: 2.6 cm low-density lesion in the left liver approaches water attenuation and is compatible with a cyst. Musculoskeletal: No worrisome lytic or sclerotic osseous abnormality. Review of the MIP images confirms the above findings. IMPRESSION: 1. No CT evidence for acute pulmonary embolus. 2. Bilateral patchy ground-glass infiltrates peripherally in both lungs with an upper lung predominance. Imaging features are nonspecific but likely related to infectious/inflammatory etiology and atypical or viral pneumonia would be a consideration. 3. Borderline mediastinal lymphadenopathy, likely reactive. 4. Aortic Atherosclerosis (ICD10-I70.0). Electronically Signed   By: EMisty StanleyM.D.   On: 09/27/2019 07:35   DG Chest Port 1 View  Result Date: 09/26/2019 CLINICAL DATA:  Low-grade fever. EXAM: PORTABLE CHEST 1 VIEW COMPARISON:  10/24/2016 FINDINGS: 1300 hours. The cardiopericardial silhouette is within normal limits for size. Interstitial markings are diffusely coarsened with chronic features. Patchy subtle airspace opacities are seen peripherally in both lungs. No associated pleural effusion period bones are demineralized. IMPRESSION: 1. Patchy subtle airspace opacities in the periphery of both lungs, left greater than right, without pleural effusion. Imaging features compatible with multifocal pneumonia and atypical/viral etiology should be considered. I discussed these findings by telephone with Dr. QJacqualine Codeat approximately 13 20 on 09/26/2019. Electronically Signed   By: EMisty StanleyM.D.   On: 09/26/2019 13:23   ECHOCARDIOGRAM COMPLETE  Result Date: 10/13/2019   ECHOCARDIOGRAM REPORT   [facility]   [facility]: 10[facility]13/2019  Medical Rec #:  [id number]  Height:       66.0 in Accession #:    [id number]              Weight:       140.3 lb Date of Birth:  Dec 04, 1934               BSA:          1.72 m Patient Age:    5 years                BP:           113/54 mmHg Patient Gender: F                       HR:           69 bpm. Exam Location:  Inpatient Procedure: 2D Echo, Color Doppler and Cardiac Doppler Indications:    I48.91* Unspecified atrial fibrillation  History:        Patient has prior history of Echocardiogram examinations, most                 recent 04/23/2018. Risk Factors:Hypertension and Dyslipidemia.                 Active COVID-19 Infection at time of study.  Sonographer:    Raquel Sarna Senior RDCS Referring Phys: [place]  1. Left ventricular ejection fraction, by visual estimation, is 55 to 60%. The left ventricle has normal function. There is no left ventricular hypertrophy.  2. Left ventricular diastolic parameters are consistent with Grade I diastolic dysfunction (impaired relaxation).  3. The left ventricle has no regional wall motion abnormalities.  4. Global right ventricle has normal systolic function.The right ventricular size is normal. No increase in right ventricular wall thickness.  5. Left atrial size was normal.  6. Right atrial size was normal.  7. The mitral valve is normal in structure. Trace mitral valve regurgitation. No evidence of mitral stenosis.  8. The tricuspid valve is normal in structure. Tricuspid valve regurgitation is not demonstrated.  9. The aortic valve is tricuspid. Aortic valve regurgitation is mild. Mild aortic valve sclerosis without stenosis. 10. TR signal is inadequate for assessing pulmonary artery systolic pressure. 11. The inferior vena cava is normal in size with greater than 50% respiratory variability, suggesting right atrial pressure of 3 mmHg. FINDINGS  Left Ventricle: Left ventricular ejection fraction, by visual estimation, is 55 to 60%. The  left ventricle has normal function. The left ventricle has no regional wall motion abnormalities. There is no left ventricular hypertrophy. Left ventricular diastolic parameters are consistent with Grade I diastolic dysfunction (impaired relaxation). Right Ventricle: The right ventricular size is normal. No increase in right ventricular wall thickness. Global RV systolic function is has normal systolic function. Left Atrium: Left atrial size was normal in size. Right Atrium: Right atrial size was normal in size Pericardium: There is no evidence of pericardial effusion. Mitral Valve: The mitral valve is normal in structure. No evidence of mitral valve stenosis by observation. Trace mitral valve regurgitation. Tricuspid Valve: The tricuspid valve is normal in structure. Tricuspid valve regurgitation is not demonstrated. Aortic Valve: The aortic valve is tricuspid. Aortic valve regurgitation is mild. Mild aortic valve sclerosis is present, with no evidence of aortic valve stenosis. Pulmonic Valve: The pulmonic valve was normal in structure. Pulmonic valve regurgitation is not visualized. Aorta: The aortic root is normal in size and structure. Venous: The inferior vena cava is  normal in size with greater than 50% respiratory variability, suggesting right atrial pressure of 3 mmHg. IAS/Shunts: No atrial level shunt detected by color flow Doppler.  LEFT VENTRICLE PLAX 2D LVIDd:         4.35 cm  Diastology LVIDs:         3.17 cm  LV e' lateral:   6.38 cm/s LV PW:         0.98 cm  LV E/e' lateral: 7.3 LV IVS:        0.79 cm  LV e' medial:    4.06 cm/s LVOT diam:     1.80 cm  LV E/e' medial:  11.5 LV SV:         45 ml LV SV Index:   26.28 LVOT Area:     2.54 cm  RIGHT VENTRICLE RV S prime:     13.20 cm/s TAPSE (M-mode): 2.4 cm LEFT ATRIUM             Index       RIGHT ATRIUM           Index LA diam:        2.70 cm 1.57 cm/m  RA Area:     15.50 cm LA Vol (A2C):   41.8 ml 24.30 ml/m RA Volume:   39.40 ml  22.91 ml/m LA  Vol (A4C):   27.0 ml 15.70 ml/m LA Biplane Vol: 36.6 ml 21.28 ml/m  AORTIC VALVE LVOT Vmax:   90.90 cm/s LVOT Vmean:  59.400 cm/s LVOT VTI:    0.177 m  AORTA Ao Root diam: 2.80 cm MITRAL VALVE MV Area (PHT): 2.37 cm             SHUNTS MV PHT:        92.80 msec           Systemic VTI:  0.18 m MV Decel Time: 320 msec             Systemic Diam: 1.80 cm MV E velocity: 46.60 cm/s 103 cm/s MV A velocity: 82.90 cm/s 70.3 cm/s MV E/A ratio:  0.56       1.5  Loralie Champagne MD Electronically signed by Loralie Champagne MD Signature Date/Time: 10/13/2019/3:50:44 PM    Final

## 2019-10-18 LAB — CBC WITH DIFFERENTIAL/PLATELET
Abs Immature Granulocytes: 0.19 10*3/uL — ABNORMAL HIGH (ref 0.00–0.07)
Basophils Absolute: 0 10*3/uL (ref 0.0–0.1)
Basophils Relative: 1 %
Eosinophils Absolute: 0 10*3/uL (ref 0.0–0.5)
Eosinophils Relative: 0 %
HCT: 38.4 % (ref 36.0–46.0)
Hemoglobin: 12 g/dL (ref 12.0–15.0)
Immature Granulocytes: 2 %
Lymphocytes Relative: 3 %
Lymphs Abs: 0.3 10*3/uL — ABNORMAL LOW (ref 0.7–4.0)
MCH: 28.1 pg (ref 26.0–34.0)
MCHC: 31.3 g/dL (ref 30.0–36.0)
MCV: 89.9 fL (ref 80.0–100.0)
Monocytes Absolute: 0.2 10*3/uL (ref 0.1–1.0)
Monocytes Relative: 3 %
Neutro Abs: 7.4 10*3/uL (ref 1.7–7.7)
Neutrophils Relative %: 91 %
Platelets: 203 10*3/uL (ref 150–400)
RBC: 4.27 MIL/uL (ref 3.87–5.11)
RDW: 12.8 % (ref 11.5–15.5)
WBC: 8.1 10*3/uL (ref 4.0–10.5)
nRBC: 0 % (ref 0.0–0.2)

## 2019-10-18 LAB — COMPREHENSIVE METABOLIC PANEL
ALT: 18 U/L (ref 0–44)
AST: 17 U/L (ref 15–41)
Albumin: 3.2 g/dL — ABNORMAL LOW (ref 3.5–5.0)
Alkaline Phosphatase: 101 U/L (ref 38–126)
Anion gap: 12 (ref 5–15)
BUN: 53 mg/dL — ABNORMAL HIGH (ref 8–23)
CO2: 30 mmol/L (ref 22–32)
Calcium: 8.9 mg/dL (ref 8.9–10.3)
Chloride: 103 mmol/L (ref 98–111)
Creatinine, Ser: 0.63 mg/dL (ref 0.44–1.00)
GFR calc Af Amer: >60 mL/min (ref >60)
GFR calc non Af Amer: >60 mL/min (ref >60)
Glucose, Bld: 166 mg/dL — ABNORMAL HIGH (ref 70–99)
Potassium: 3.9 mmol/L (ref 3.5–5.1)
Sodium: 145 mmol/L (ref 135–145)
Total Bilirubin: 0.8 mg/dL (ref 0.3–1.2)
Total Protein: 5.9 g/dL — ABNORMAL LOW (ref 6.5–8.1)

## 2019-10-18 LAB — D-DIMER, QUANTITATIVE: D-Dimer, Quant: 6.66 ug/mL-FEU — ABNORMAL HIGH (ref 0.00–0.50)

## 2019-10-18 LAB — BRAIN NATRIURETIC PEPTIDE: B Natriuretic Peptide: 46.9 pg/mL (ref 0.0–100.0)

## 2019-10-18 LAB — MAGNESIUM: Magnesium: 2.6 mg/dL — ABNORMAL HIGH (ref 1.7–2.4)

## 2019-10-18 LAB — C-REACTIVE PROTEIN: CRP: 0.5 mg/dL (ref <1.0)

## 2019-10-18 MED ORDER — HALOPERIDOL LACTATE 5 MG/ML IJ SOLN
2.0000 mg | Freq: Four times a day (QID) | INTRAMUSCULAR | Status: DC | PRN
  Administered 2019-10-18 – 2019-10-19 (×4): 2 mg via INTRAVENOUS
  Filled 2019-10-18 (×5): qty 1

## 2019-10-18 NOTE — Progress Notes (Signed)
Walked in patients room and she had pulled NRB off and sp02 dropped to 47%, Rn walked in behind me. Pt's oxygen level recovered quickly once NRB was placed back on.

## 2019-10-18 NOTE — Plan of Care (Signed)
  Problem: Education: Goal: Knowledge of risk factors and measures for prevention of condition will improve Outcome: Progressing   Problem: Coping: Goal: Psychosocial and spiritual needs will be supported Outcome: Progressing   Problem: Respiratory: Goal: Will maintain a patent airway Outcome: Progressing Goal: Complications related to the disease process, condition or treatment will be avoided or minimized Outcome: Progressing   

## 2019-10-18 NOTE — Progress Notes (Signed)
ANTICOAGULATION CONSULT NOTE - Follow Up Consult  Pharmacy Consult for Eliquis --> LMWH Indication: new onset  atrial fibrillation  Allergies  Allergen Reactions  . Lexapro [Escitalopram Oxalate] Itching    Patient Measurements: Height: 5\' 6"  (167.6 cm) Weight: 140 lb 4.8 oz (63.6 kg) IBW/kg (Calculated) : 59.3 Heparin Dosing Weight:   Vital Signs: Temp: 98.4 F (36.9 C) (12/13 0800) Temp Source: Oral (12/13 0800) BP: 119/51 (12/13 0900) Pulse Rate: 73 (12/13 0900)  Labs: Recent Labs    10/16/19 0040 10/17/19 0219 10/18/19 0434  HGB 12.5 12.0 12.0  HCT 38.9 37.9 38.4  PLT 220 228 203  CREATININE 0.65 0.69 0.63    Estimated Creatinine Clearance: 50.8 mL/min (by C-G formula based on SCr of 0.63 mg/dL).   Assessment: Patient is an 84 y.o F started on Eliquis on 12/8 for new onset Afib.  D-dimer remains elevated and is trending up.  Pharmacy was consulted on 10/15/19 to transition her to full dose lovenox.  Cr and CBC stable, D-dimer trending down. Per MD, will transition back to apixaban pending clinical progress and D-dimer trend.   Goal of Therapy:  Anti-Xa level 0.6-1 units/ml 4hrs after LMWH dose given Monitor platelets by anticoagulation protocol: Yes   Plan:  -Enoxaparin 60mg  [place] BID  Arrie Senate, PharmD, BCPS Clinical Pharmacist Please check AMION for all [facility] Pharmacy numbers 10/18/2019

## 2019-10-18 NOTE — Progress Notes (Signed)
PROGRESS NOTE                                                                                                                                                                                                             Patient Demographics:    Elaine Price, is a 84 y.o. female, DOB - [birth year], FGB:[id number]  Outpatient Primary MD for the patient is McLean-Scocuzza, Nino Glow, MD    LOS - 6  Admit date - 10/19/2019    CC - SOB     Brief Narrative  -  Elaine Price  is a 84 y.o. female, with history significant for breast cancer, essential hypertension, dyslipidemia, anemia of chronic disease, diverticulosis without diverticulitis, recent admission for pneumonia.  Patient otherwise is quite healthy and independent still lives alone and drives, has been sick for around a month now.  Was recently admitted for pneumonia to [facility] hospital and discharged few weeks ago, she was diagnosed recently in the outpatient setting with COVID-19 infection 2 days prior to her ER visit, her daughter also tested positive.  In the [place] ER she was diagnosed with acute hypoxic respiratory failure due to COVID-19 pneumonitis, she was placed on a nonrebreather mask, she was started on IV steroids and remdesivir and transferred here to [facility] for further care.  Patient currently has only shortness of breath and a dry cough as her complaints, denies any headache, no chest or abdominal pain, no productive phlegm, is short of breath, shortness of breath is worse with exertion better with rest, no other subjective complaints.    Subjective:   Patient in bed, appears comfortable, denies any headache, no fever, no chest pain or pressure, +ve shortness of breath , no abdominal pain. No focal weakness.   Assessment  & Plan :     1. Acute Hypoxic Resp. Failure due to Acute Covid 19 Viral Pneumonitis during the ongoing 2020 Covid 19 Pandemic - She had  severe Hypoxia upon arrival from [facility], she was on nonrebreather.  She received IV steroids and remdesivir at [facility] for the first day of her hospital stay, she was getting worse rapidly and had to be given supplemental nasal cannula oxygen on top of nonrebreather.  Upon arrival she received IV Actemra and since then she has shown improvement and she is now on nonrebreather mask with rate  of flow around 10L - 15L  with Pox 95%.  Encouraged the patient to sit up in chair in the daytime use I-S and flutter valve for pulmonary toiletry and then prone in bed when at night.  We will continue to titrate the oxygen down, problem is she is not tolerating nasal cannula and keeps on pulling it.  Have added little Seroquel + PRN Haldol as well for anxiety and delirium.  Overall if declines any further - full comfort care and Hospice.   SpO2: 92 % O2 Flow Rate (L/min): 15 L/min FiO2 (%): 100 %  Recent Labs  Lab 10/21/2019 0513 10/06/2019 0513 10/13/19 0320 10/14/19 0230 10/15/19 0215 10/16/19 0040 10/17/19 0219 10/18/19 0434  CRP 8.7*  --  8.1* 3.8* 2.0* 1.2* 0.8 0.5  DDIMER  --    < > 3.55* 6.37* 9.10* 10.97* 8.44* 6.66*  FERRITIN 481*  --   --   --   --   --   --   --   BNP  --   --  86.1 224.8* 91.1 65.1 70.8  --   PROCALCITON <0.10  --   --   --   --   --   --   --    < > = values in this interval not displayed.    Hepatic Function Latest Ref Rng & Units 10/18/2019 10/17/2019 10/16/2019  Total Protein 6.5 - 8.1 g/dL 5.9(L) 5.8(L) 6.1(L)  Albumin 3.5 - 5.0 g/dL 3.2(L) 3.0(L) 3.1(L)  AST 15 - 41 U/L _0 ALT 0 - 44 U/L _1 Alk Phosphatase 38 - 126 U/L 101 102 102  Total Bilirubin 0.3 - 1.2 mg/dL 0.8 0.4 0.5  Bilirubin, Direct 0.0 - 0.2 mg/dL - - -      2.  Elevated D-dimer due to inflammation.  CTA is negative but D-dimer is rising despite being on Eliquis, has been switched to full dose Lovenox on 10/15/2019.  3.  New onset A. fib RVR.  Due to pulmonary  stress. Stable TSH and echocardiogram with EF 60%, currently on oral amiodarone and diltiazem, needed IV Cardizem drip again on 10/16/2019 which I will taper off on 10/17/2019, also received 2 doses of digoxin on 10/16/2019.  Will try and titrate off Cardizem drip today.  Mali vas 2 score of at least 3.  Currently on Lovenox.  Once D-dimer starts trending down we will switch her back to Eliquis.  4.  GERD.  On PPI.  5.  Mild acute on chr. Grade 1 Diastolic CHF EF 79% -  Mild acute failure, IV Lasix will be repeated again on 10/18/2019.  6.  Anxiety and delirium.  Low-dose Seroquel added on 10/15/2019, PRN Haldol as well, was worse with Xanax.    Condition -  Guarded, Overall if declines any further - full comfort care and Hospice.  Family Communication  : daughter updated 10/13/2019, will call 12/9, 12/10, 12/11, 12/12, 12/13  Code Status :  DNR  Diet :    Diet Order            DIET SOFT Room service appropriate? Yes; Fluid consistency: Thin  Diet effective now               Disposition Plan  : ICU >> PCU  Consults  :    Procedures  :     CTA - No PE  TTE  1. Left ventricular ejection fraction, by visual estimation, is 55 to 60%. The  left ventricle has normal function. There is no left ventricular hypertrophy.  2. Left ventricular diastolic parameters are consistent with Grade I diastolic dysfunction (impaired relaxation).  3. The left ventricle has no regional wall motion abnormalities.  4. Global right ventricle has normal systolic function.The right ventricular size is normal. No increase in right ventricular wall thickness.  5. Left atrial size was normal.  6. Right atrial size was normal.  7. The mitral valve is normal in structure. Trace mitral valve regurgitation. No evidence of mitral stenosis.  8. The tricuspid valve is normal in structure. Tricuspid valve regurgitation is not demonstrated.  9. The aortic valve is tricuspid. Aortic valve regurgitation is mild.  Mild aortic valve sclerosis without stenosis. 10. TR signal is inadequate for assessing pulmonary artery systolic pressure. 11. The inferior vena cava is normal in size with greater than 50% respiratory variability, suggesting right atrial pressure of 3 mmHg.   PUD Prophylaxis : PPI  DVT Prophylaxis  :  Lovenox >> Eliquis  Lab Results  Component Value Date   PLT 203 10/18/2019    Inpatient Medications  Scheduled Meds: . amiodarone  100 mg Oral Daily  . Chlorhexidine Gluconate Cloth  6 each Topical Daily  . dextromethorphan-guaiFENesin  2 tablet Oral Q12H  . diltiazem  90 mg Oral Q8H  . enoxaparin (LOVENOX) injection  60 mg Subcutaneous Q12H  . feeding supplement (PRO-STAT SUGAR FREE 64)  30 mL Oral TID WC  . mouth rinse  15 mL Mouth Rinse BID  . methylPREDNISolone (SOLU-MEDROL) injection  30 mg Intravenous Q24H  . pantoprazole  40 mg Oral Daily  . QUEtiapine  12.5 mg Oral BID  . sodium chloride flush  3 mL Intravenous Q12H  . traZODone  25 mg Oral QHS   Continuous Infusions:  PRN Meds:.acetaminophen, albuterol, bisacodyl, haloperidol lactate, [DISCONTINUED] ondansetron **OR** ondansetron (ZOFRAN) IV, sodium chloride  Antibiotics  :    Anti-infectives (From admission, onward)   Start     Dose/Rate Route Frequency Ordered Stop   10/13/19 2215  remdesivir 100 mg in sodium chloride 0.9 % 100 mL IVPB     100 mg 200 mL/hr over 30 Minutes Intravenous Once 10/13/19 2158 10/13/19 2326   10/13/19 1000  remdesivir 100 mg in sodium chloride 0.9 % 100 mL IVPB  Status:  Discontinued     100 mg 200 mL/hr over 30 Minutes Intravenous Daily 10/11/2019 1631 10/28/2019 1641   10/13/19 0600  remdesivir 100 mg in sodium chloride 0.9 % 100 mL IVPB  Status:  Discontinued     100 mg 200 mL/hr over 30 Minutes Intravenous Daily 10/08/2019 1643 10/16/19 1504   10/18/2019 1645  remdesivir 200 mg in sodium chloride 0.9% 250 mL IVPB  Status:  Discontinued     200 mg 580 mL/hr over 30 Minutes Intravenous  Once 11/04/2019 1631 10/09/2019 1641       Time Spent in minutes  30   Lala Lund M.D on 10/18/2019 at 9:19 AM  To page go to [email or web address] - password [facility]  [facility] -  Office  [phone]  See all Orders from today for further details    Objective:   Vitals:   10/17/19 2105 10/18/19 0408 10/18/19 0800 10/18/19 0859  BP: (!) 135/59 (!) 118/52 (!) 119/51   Pulse: (!) 59 73 85   Resp: 18 17 (!) 24   Temp: 97.7 F (36.5 C) (!) 97.5 F (36.4 C) 98.4 F (36.9 C)   TempSrc: Oral Axillary Oral  SpO2: 100% 97% 93% 92%  Weight:      Height:        Wt Readings from Last 3 Encounters:  10/13/19 63.6 kg  10/28/2019 68.2 kg  10/06/19 68.2 kg     Intake/Output Summary (Last 24 hours) at 10/18/2019 0919 Last data filed at 10/17/2019 2008 Gross per 24 hour  Intake --  Output 600 ml  Net -600 ml     Physical Exam  Awake , appears tiered, mildly confused [place].AT,PERRAL Supple Neck,No JVD, No cervical lymphadenopathy appriciated.  Symmetrical Chest wall movement, Good air movement bilaterally, CTAB RRR,No Gallops, Rubs or new Murmurs, No Parasternal Heave +ve B.Sounds, Abd Soft, No tenderness, No organomegaly appriciated, No rebound - guarding or rigidity. No Cyanosis, Clubbing or edema, No new Rash or bruise    Data Review:    CBC Recent Labs  Lab 10/14/19 0230 10/15/19 0215 10/16/19 0040 10/17/19 0219 10/18/19 0434  WBC 7.6 8.3 8.6 7.9 8.1  HGB 11.3* 12.3 12.5 12.0 12.0  HCT 36.7 38.9 38.9 37.9 38.4  PLT 219 221 220 228 203  MCV 92.4 90.9 89.0 90.0 89.9  MCH 28.5 28.7 28.6 28.5 28.1  MCHC 30.8 31.6 32.1 31.7 31.3  RDW 12.9 13.0 12.7 12.9 12.8  LYMPHSABS 0.4* 0.2* 0.3* 0.2* 0.3*  MONOABS 0.1 0.1 0.2 0.2 0.2  EOSABS 0.0 0.0 0.0 0.0 0.0  BASOSABS 0.0 0.0 0.0 0.0 0.0    Chemistries  Recent Labs  Lab 10/14/19 0230 10/15/19 0215 10/16/19 0040 10/17/19 0219 10/18/19 0434  NA 139 140 140 143 145  K 4.7 4.3 4.0 4.4 3.9  CL 104 103 102 102  103  CO2 _0 GLUCOSE 145* 146* 172* 190* 166*  BUN 33* 41* 41* 46* 53*  CREATININE 0.69 0.71 0.65 0.69 0.63  CALCIUM 8.7* 8.8* 8.9 8.9 8.9  MG 2.3 2.5* 2.4 2.5* 2.6*  AST _1 ALT _2 ALKPHOS 76 93 102 102 101  BILITOT 0.5 0.5 0.5 0.4 0.8   ------------------------------------------------------------------------------------------------------------------ No results for input(s): CHOL, HDL, LDLCALC, TRIG, CHOLHDL, LDLDIRECT in the last 72 hours.  Lab Results  Component Value Date   HGBA1C 5.9 09/11/2019   ------------------------------------------------------------------------------------------------------------------ No results for input(s): TSH, T4TOTAL, T3FREE, THYROIDAB in the last 72 hours.  Invalid input(s): FREET3  Cardiac Enzymes No results for input(s): CKMB, TROPONINI, MYOGLOBIN in the last 168 hours.  Invalid input(s): CK ------------------------------------------------------------------------------------------------------------------    Component Value Date/Time   BNP 70.8 10/17/2019 0219    Micro Results Recent Results (from the past 240 hour(s))  Blood Culture (routine x 2)     Status: None   Collection Time: 10/15/2019  5:29 AM   Specimen: BLOOD  Result Value Ref Range Status   Specimen Description BLOOD LEFT [facility]  Final   Special Requests   Final    BOTTLES DRAWN AEROBIC AND ANAEROBIC Blood Culture adequate volume   Culture   Final    NO GROWTH 5 DAYS Performed at [facility], [address] [place], [place] [address]    Report Status 10/17/2019 FINAL  Final  Blood Culture (routine x 2)     Status: None   Collection Time: 10/06/2019  5:29 AM   Specimen: BLOOD  Result Value Ref Range Status   Specimen Description BLOOD RIGHT WRIST  Final   Special Requests   Final    BOTTLES DRAWN AEROBIC ONLY Blood Culture results may not be optimal  due to an inadequate volume of blood received in culture bottles   Culture    Final    NO GROWTH 5 DAYS Performed at [facility], [address] [place], [place] [address]    Report Status 10/17/2019 FINAL  Final  MRSA PCR Screening     Status: None   Collection Time: 10/13/19  5:40 AM   Specimen: Nasopharyngeal  Result Value Ref Range Status   MRSA by PCR NEGATIVE NEGATIVE Final    Comment:        The GeneXpert MRSA Assay (FDA approved for NASAL specimens only), is one component of a comprehensive MRSA colonization surveillance program. It is not intended to diagnose MRSA infection nor to guide or monitor treatment for MRSA infections. Performed at [facility], [place] [address] [place], [place] [address]     Radiology Reports DG Chest 1 View  Result Date: 10/08/2019 CLINICAL DATA:  Shortness of breath.  COVID-19 positive EXAM: CHEST  1 VIEW COMPARISON:  09/26/2019 FINDINGS: Bilateral pulmonary infiltrate. Infiltrates were also seen November 2020, but opacity is denser and more widespread today. No edema, effusion, or pneumothorax. Normal heart size. No acute osseous finding. IMPRESSION: Bilateral pneumonia. Electronically Signed   By: Monte Fantasia M.D.   On: 10/15/2019 05:32   CT ANGIO CHEST PE W OR WO CONTRAST  Result Date: 09/27/2019 CLINICAL DATA:  Multiple falls and feeling fatigued. PE suspected. EXAM: CT ANGIOGRAPHY CHEST WITH CONTRAST TECHNIQUE: Multidetector CT imaging of the chest was performed using the standard protocol during bolus administration of intravenous contrast. Multiplanar CT image reconstructions and MIPs were obtained to evaluate the vascular anatomy. CONTRAST:  32m OMNIPAQUE IOHEXOL 350 MG/ML SOLN COMPARISON:  None. FINDINGS: Cardiovascular: Heart is enlarged. No pericardial effusion. Coronary artery calcification is evident. Atherosclerotic calcification is noted in the wall of the thoracic aorta. No filling defect within the opacified pulmonary arteries to suggest the presence of an acute pulmonary  embolus. Mediastinum/Nodes: Mediastinal lymph nodes measure up to 8 mm short axis in the pre-vascular window. 9 mm short axis subcarinal lymph node. The esophagus has normal imaging features. Surgical clips are noted in the left axilla. Lungs/Pleura: Lungs demonstrate bilateral patchy ground-glass infiltrates peripherally in both lungs with an upper lung predominance. No pleural effusion. Upper Abdomen: 2.6 cm low-density lesion in the left liver approaches water attenuation and is compatible with a cyst. Musculoskeletal: No worrisome lytic or sclerotic osseous abnormality. Review of the MIP images confirms the above findings. IMPRESSION: 1. No CT evidence for acute pulmonary embolus. 2. Bilateral patchy ground-glass infiltrates peripherally in both lungs with an upper lung predominance. Imaging features are nonspecific but likely related to infectious/inflammatory etiology and atypical or viral pneumonia would be a consideration. 3. Borderline mediastinal lymphadenopathy, likely reactive. 4. Aortic Atherosclerosis (ICD10-I70.0). Electronically Signed   By: EMisty StanleyM.D.   On: 09/27/2019 07:35   DG Chest Port 1 View  Result Date: 09/26/2019 CLINICAL DATA:  Low-grade fever. EXAM: PORTABLE CHEST 1 VIEW COMPARISON:  10/24/2016 FINDINGS: 1300 hours. The cardiopericardial silhouette is within normal limits for size. Interstitial markings are diffusely coarsened with chronic features. Patchy subtle airspace opacities are seen peripherally in both lungs. No associated pleural effusion period bones are demineralized. IMPRESSION: 1. Patchy subtle airspace opacities in the periphery of both lungs, left greater than right, without pleural effusion. Imaging features compatible with multifocal pneumonia and atypical/viral etiology should be considered. I discussed these findings by telephone with Dr. QJacqualine Codeat approximately [phone] on  09/26/2019. Electronically Signed   By: Misty Stanley M.D.   On: 09/26/2019 13:23    ECHOCARDIOGRAM COMPLETE  Result Date: 10/13/2019   ECHOCARDIOGRAM REPORT   Patient Name:   MAYRELI ALDEN [facility] Date of Exam: 10/13/2019 Medical Rec #:  [id number]               Height:       66.0 in Accession #:    [id number]              Weight:       140.3 lb Date of Birth:  01/25/1935               BSA:          1.72 m Patient Age:    4 years                BP:           113/54 mmHg Patient Gender: F                       HR:           69 bpm. Exam Location:  Inpatient Procedure: 2D Echo, Color Doppler and Cardiac Doppler Indications:    I48.91* Unspecified atrial fibrillation  History:        Patient has prior history of Echocardiogram examinations, most                 recent 04/23/2018. Risk Factors:Hypertension and Dyslipidemia.                 Active COVID-19 Infection at time of study.  Sonographer:    Raquel Sarna Senior RDCS Referring Phys: Kismet Canavanas  1. Left ventricular ejection fraction, by visual estimation, is 55 to 60%. The left ventricle has normal function. There is no left ventricular hypertrophy.  2. Left ventricular diastolic parameters are consistent with Grade I diastolic dysfunction (impaired relaxation).  3. The left ventricle has no regional wall motion abnormalities.  4. Global right ventricle has normal systolic function.The right ventricular size is normal. No increase in right ventricular wall thickness.  5. Left atrial size was normal.  6. Right atrial size was normal.  7. The mitral valve is normal in structure. Trace mitral valve regurgitation. No evidence of mitral stenosis.  8. The tricuspid valve is normal in structure. Tricuspid valve regurgitation is not demonstrated.  9. The aortic valve is tricuspid. Aortic valve regurgitation is mild. Mild aortic valve sclerosis without stenosis. 10. TR signal is inadequate for assessing pulmonary artery systolic pressure. 11. The inferior vena cava is normal in size with greater than 50% respiratory variability,  suggesting right atrial pressure of 3 mmHg. FINDINGS  Left Ventricle: Left ventricular ejection fraction, by visual estimation, is 55 to 60%. The left ventricle has normal function. The left ventricle has no regional wall motion abnormalities. There is no left ventricular hypertrophy. Left ventricular diastolic parameters are consistent with Grade I diastolic dysfunction (impaired relaxation). Right Ventricle: The right ventricular size is normal. No increase in right ventricular wall thickness. Global RV systolic function is has normal systolic function. Left Atrium: Left atrial size was normal in size. Right Atrium: Right atrial size was normal in size Pericardium: There is no evidence of pericardial effusion. Mitral Valve: The mitral valve is normal in structure. No evidence of mitral valve stenosis by observation. Trace mitral valve regurgitation. Tricuspid Valve: The tricuspid valve is normal in structure. Tricuspid valve  regurgitation is not demonstrated. Aortic Valve: The aortic valve is tricuspid. Aortic valve regurgitation is mild. Mild aortic valve sclerosis is present, with no evidence of aortic valve stenosis. Pulmonic Valve: The pulmonic valve was normal in structure. Pulmonic valve regurgitation is not visualized. Aorta: The aortic root is normal in size and structure. Venous: The inferior vena cava is normal in size with greater than 50% respiratory variability, suggesting right atrial pressure of 3 mmHg. IAS/Shunts: No atrial level shunt detected by color flow Doppler.  LEFT VENTRICLE PLAX 2D LVIDd:         4.35 cm  Diastology LVIDs:         3.17 cm  LV e' lateral:   6.38 cm/s LV PW:         0.98 cm  LV E/e' lateral: 7.3 LV IVS:        0.79 cm  LV e' medial:    4.06 cm/s LVOT diam:     1.80 cm  LV E/e' medial:  11.5 LV SV:         45 ml LV SV Index:   26.28 LVOT Area:     2.54 cm  RIGHT VENTRICLE RV S prime:     13.20 cm/s TAPSE (M-mode): 2.4 cm LEFT ATRIUM             Index       RIGHT ATRIUM            Index LA diam:        2.70 cm 1.57 cm/m  RA Area:     15.50 cm LA Vol (A2C):   41.8 ml 24.30 ml/m RA Volume:   39.40 ml  22.91 ml/m LA Vol (A4C):   27.0 ml 15.70 ml/m LA Biplane Vol: 36.6 ml 21.28 ml/m  AORTIC VALVE LVOT Vmax:   90.90 cm/s LVOT Vmean:  59.400 cm/s LVOT VTI:    0.177 m  AORTA Ao Root diam: 2.80 cm MITRAL VALVE MV Area (PHT): 2.37 cm             SHUNTS MV PHT:        92.80 msec           Systemic VTI:  0.18 m MV Decel Time: 320 msec             Systemic Diam: 1.80 cm MV E velocity: 46.60 cm/s 103 cm/s MV A velocity: 82.90 cm/s 70.3 cm/s MV E/A ratio:  0.56       1.5  Loralie Champagne MD Electronically signed by Loralie Champagne MD Signature Date/Time: 10/13/2019/3:50:44 PM    Final

## 2019-10-18 NOTE — Progress Notes (Signed)
ICU CN/RRN Rounding Note - Patient with oxygen saturation 98% on NRB. No respiratory distress. If the bedside RN needs any assistance please call [phone]

## 2019-10-19 ENCOUNTER — Other Ambulatory Visit (HOSPITAL_BASED_OUTPATIENT_CLINIC_OR_DEPARTMENT_OTHER): Payer: Self-pay

## 2019-10-19 ENCOUNTER — Other Ambulatory Visit: Payer: Self-pay

## 2019-10-19 MED ORDER — GLYCOPYRROLATE 1 MG PO TABS
1.0000 mg | ORAL_TABLET | ORAL | Status: DC | PRN
  Filled 2019-10-19: qty 1

## 2019-10-19 MED ORDER — ASPIRIN 81 MG PO CHEW: 324.0000 mg | CHEWABLE_TABLET | Freq: Once | ORAL | Status: AC

## 2019-10-19 MED ORDER — HALOPERIDOL LACTATE 2 MG/ML PO CONC
0.5000 mg | ORAL | Status: DC | PRN
  Filled 2019-10-19: qty 0.3

## 2019-10-19 MED ORDER — HALOPERIDOL LACTATE 5 MG/ML IJ SOLN: 0.5000 mg | INTRAMUSCULAR | Status: DC | PRN

## 2019-10-19 MED ORDER — DILTIAZEM HCL 25 MG/5ML IV SOLN
10.0000 mg | INTRAVENOUS | Status: DC | PRN
  Filled 2019-10-19: qty 5

## 2019-10-19 MED ORDER — LORAZEPAM 2 MG/ML IJ SOLN
1.0000 mg | INTRAMUSCULAR | Status: DC | PRN
  Administered 2019-10-19 – 2019-10-21 (×2): 1 mg via INTRAVENOUS
  Filled 2019-10-19 (×3): qty 1

## 2019-10-19 MED ORDER — LORAZEPAM 2 MG/ML PO CONC: 1.0000 mg | ORAL | Status: DC | PRN

## 2019-10-19 MED ORDER — HALOPERIDOL 0.5 MG PO TABS
0.5000 mg | ORAL_TABLET | ORAL | Status: DC | PRN
  Filled 2019-10-19: qty 1

## 2019-10-19 MED ORDER — GLYCOPYRROLATE 0.2 MG/ML IJ SOLN: 0.2000 mg | INTRAMUSCULAR | Status: DC | PRN

## 2019-10-19 MED ORDER — MORPHINE SULFATE (PF) 2 MG/ML IV SOLN: 2.0000 mg | INTRAVENOUS | Status: DC | PRN

## 2019-10-19 MED ORDER — MORPHINE BOLUS VIA INFUSION
1.0000 mg | INTRAVENOUS | Status: DC | PRN
  Filled 2019-10-19: qty 1

## 2019-10-19 MED ORDER — MORPHINE SULFATE (PF) 2 MG/ML IV SOLN
1.0000 mg | INTRAVENOUS | Status: DC | PRN
  Administered 2019-10-19: 1 mg via INTRAVENOUS
  Filled 2019-10-19: qty 1

## 2019-10-19 MED ORDER — NITROGLYCERIN 0.4 MG SL SUBL
SUBLINGUAL_TABLET | SUBLINGUAL | Status: AC
  Administered 2019-10-19: 0.4 mg via SUBLINGUAL
  Filled 2019-10-19: qty 3

## 2019-10-19 MED ORDER — ASPIRIN 81 MG PO CHEW
CHEWABLE_TABLET | ORAL | Status: AC
  Administered 2019-10-19: 324 mg via ORAL
  Filled 2019-10-19: qty 4

## 2019-10-19 MED ORDER — MORPHINE 100MG IN NS 100ML (1MG/ML) PREMIX INFUSION
1.0000 mg/h | INTRAVENOUS | Status: DC
  Administered 2019-10-19 – 2019-10-21 (×2): 1 mg/h via INTRAVENOUS
  Administered 2019-10-22 – 2019-10-23 (×2): 10 mg/h via INTRAVENOUS
  Filled 2019-10-19 (×4): qty 100

## 2019-10-19 MED ORDER — LORAZEPAM 0.5 MG PO TABS: 1.0000 mg | ORAL_TABLET | ORAL | Status: DC | PRN

## 2019-10-19 MED ORDER — NITROGLYCERIN 0.4 MG SL SUBL: 0.4000 mg | SUBLINGUAL_TABLET | SUBLINGUAL | Status: DC | PRN

## 2019-10-19 NOTE — Progress Notes (Addendum)
PROGRESS NOTE                                                                                                                                                                                                             Patient Demographics:    Elaine Price, is a 84 y.o. female, DOB - 1935-10-31, FEO:[id number]  Outpatient Primary MD for the patient is McLean-Scocuzza, Nino Glow, MD    LOS - 7  Admit date - 10/08/2019    CC - SOB     Brief Narrative  -  Muna Demers  is a 84 y.o. female, with history significant for breast cancer, essential hypertension, dyslipidemia, anemia of chronic disease, diverticulosis without diverticulitis, recent admission for pneumonia.  Patient otherwise is quite healthy and independent still lives alone and drives, has been sick for around a month now.  Was recently admitted for pneumonia to [facility] hospital and discharged few weeks ago, she was diagnosed recently in the outpatient setting with COVID-19 infection 2 days prior to her ER visit, her daughter also tested positive.  In the [place] ER she was diagnosed with acute hypoxic respiratory failure due to COVID-19 pneumonitis, she was placed on a nonrebreather mask, she was started on IV steroids and remdesivir and transferred here to [facility] for further care.  Patient was admitted to [facility] on 15 L nasal cannula oxygen plus nonrebreather mask, she was appropriately treated, unfortunately her disease and parenchymal damage had progressed too far before she presented here.  She also developed A. fib with RVR, eventually developed encephalopathy and started refusing to eat or drink.  Multiple discussions were made by family members and finally she was transitioned to full comfort care on 10/19/2019.   Subjective:   Patient in bed, remains mildly confused, in no distress wearing nasal cannula oxygen and nonrebreather mask.  Denies any headache or chest  pain   Assessment  & Plan :    Patient is now full comfort care on morphine drip, all unnecessary medications will be stopped goal of care will be comfort.  Will seek residential hospice placement.  Medical issues addressed this hospitalization are below.    1. Acute Hypoxic Resp. Failure due to Acute Covid 19 Viral Pneumonitis during the ongoing 2020 Covid 19 Pandemic - She had severe Hypoxia upon arrival from [facility], she was on nonrebreather.  She received IV steroids and remdesivir at [facility] for the first day of her hospital stay, she was getting worse rapidly and had to be given supplemental nasal cannula oxygen on top of nonrebreather.  Upon arrival she received IV Actemra and since then she has shown improvement and she is now on nonrebreather mask with rate of flow around 10L - 15L  with Pox 95%.  Encouraged the patient to sit up in chair in the daytime use I-S and flutter valve for pulmonary toiletry and then prone in bed when at night.  We will continue to titrate the oxygen down, problem is she is not tolerating nasal cannula and keeps on pulling it.  Have added little Seroquel + PRN Haldol as well for anxiety and delirium.   SpO2: 96 % O2 Flow Rate (L/min): 15 L/min FiO2 (%): 100 %  Recent Labs  Lab 10/14/19 0230 10/15/19 0215 10/16/19 0040 10/17/19 0219 10/18/19 0434  CRP 3.8* 2.0* 1.2* 0.8 0.5  DDIMER 6.37* 9.10* 10.97* 8.44* 6.66*  BNP 224.8* 91.1 65.1 70.8 46.9    Hepatic Function Latest Ref Rng & Units 10/18/2019 10/17/2019 10/16/2019  Total Protein 6.5 - 8.1 g/dL 5.9(L) 5.8(L) 6.1(L)  Albumin 3.5 - 5.0 g/dL 3.2(L) 3.0(L) 3.1(L)  AST 15 - 41 U/L '17 23 20  ' ALT 0 - 44 U/L '18 19 18  ' Alk Phosphatase 38 - 126 U/L 101 102 102  Total Bilirubin 0.3 - 1.2 mg/dL 0.8 0.4 0.5  Bilirubin, Direct 0.0 - 0.2 mg/dL - - -      2.  Elevated D-dimer due to inflammation.  CTA is negative but D-dimer is rising despite being on Eliquis, has been switched to full  dose Lovenox on 10/15/2019.  3.  New onset A. fib RVR.  Due to pulmonary stress. Stable TSH and echocardiogram with EF 60%, currently on oral amiodarone and diltiazem, needed IV Cardizem drip again on 10/16/2019 which I will taper off on 10/17/2019, also received 2 doses of digoxin on 10/16/2019.  Will try and titrate off Cardizem drip today.  Mali vas 2 score of at least 3.  Currently on Lovenox.  Once D-dimer starts trending down we will switch her back to Eliquis.  4.  GERD.  On PPI.  5.  Mild acute on chr. Grade 1 Diastolic CHF EF 53% -  Mild acute failure, IV Lasix will be repeated again on 10/18/2019.  6.  Anxiety and delirium.  Low-dose Seroquel added on 10/15/2019, PRN Haldol as well, was worse with Xanax.    Condition -  Guarded, Overall if declines any further - full comfort care and Hospice.  Family Communication  : daughter updated 10/13/2019, will call 12/9, 12/10, 12/11, 12/12, 12/13, 12/14  Code Status :  DNR  Diet :    Diet Order            DIET SOFT Room service appropriate? Yes; Fluid consistency: Thin  Diet effective now               Disposition Plan  : Full comfort care now.  Residential hospice.  Consults  :    Procedures  :     CTA - No PE  TTE  1. Left ventricular ejection fraction, by visual estimation, is 55 to 60%. The left ventricle has normal function. There is no left ventricular hypertrophy.  2. Left ventricular diastolic parameters are consistent with Grade I diastolic dysfunction (impaired relaxation).  3. The left ventricle has no regional wall motion  abnormalities.  4. Global right ventricle has normal systolic function.The right ventricular size is normal. No increase in right ventricular wall thickness.  5. Left atrial size was normal.  6. Right atrial size was normal.  7. The mitral valve is normal in structure. Trace mitral valve regurgitation. No evidence of mitral stenosis.  8. The tricuspid valve is normal in structure. Tricuspid  valve regurgitation is not demonstrated.  9. The aortic valve is tricuspid. Aortic valve regurgitation is mild. Mild aortic valve sclerosis without stenosis. 10. TR signal is inadequate for assessing pulmonary artery systolic pressure. 11. The inferior vena cava is normal in size with greater than 50% respiratory variability, suggesting right atrial pressure of 3 mmHg.   PUD Prophylaxis : PPI  DVT Prophylaxis  :  Lovenox >> Eliquis  Lab Results  Component Value Date   PLT 203 10/18/2019    Inpatient Medications  Scheduled Meds: . amiodarone  100 mg Oral Daily  . Chlorhexidine Gluconate Cloth  6 each Topical Daily  . dextromethorphan-guaiFENesin  2 tablet Oral Q12H  . diltiazem  90 mg Oral Q8H  . enoxaparin (LOVENOX) injection  60 mg Subcutaneous Q12H  . feeding supplement (PRO-STAT SUGAR FREE 64)  30 mL Oral TID WC  . mouth rinse  15 mL Mouth Rinse BID  . methylPREDNISolone (SOLU-MEDROL) injection  30 mg Intravenous Q24H  . pantoprazole  40 mg Oral Daily  . QUEtiapine  12.5 mg Oral BID  . sodium chloride flush  3 mL Intravenous Q12H  . traZODone  25 mg Oral QHS   Continuous Infusions: . morphine 1 mg/hr (10/19/19 0727)   PRN Meds:.acetaminophen, albuterol, bisacodyl, diltiazem, glycopyrrolate **OR** glycopyrrolate **OR** glycopyrrolate, haloperidol **OR** haloperidol **OR** haloperidol lactate, haloperidol lactate, LORazepam **OR** LORazepam **OR** LORazepam, morphine, nitroGLYCERIN, [DISCONTINUED] ondansetron **OR** ondansetron (ZOFRAN) IV, sodium chloride  Antibiotics  :    Anti-infectives (From admission, onward)   Start     Dose/Rate Route Frequency Ordered Stop   10/13/19 2215  remdesivir 100 mg in sodium chloride 0.9 % 100 mL IVPB     100 mg 200 mL/hr over 30 Minutes Intravenous Once 10/13/19 2158 10/13/19 2326   10/13/19 1000  remdesivir 100 mg in sodium chloride 0.9 % 100 mL IVPB  Status:  Discontinued     100 mg 200 mL/hr over 30 Minutes Intravenous Daily  10/30/2019 1631 10/30/2019 1641   10/13/19 0600  remdesivir 100 mg in sodium chloride 0.9 % 100 mL IVPB  Status:  Discontinued     100 mg 200 mL/hr over 30 Minutes Intravenous Daily 10/18/2019 1643 10/16/19 1504   10/17/2019 1645  remdesivir 200 mg in sodium chloride 0.9% 250 mL IVPB  Status:  Discontinued     200 mg 580 mL/hr over 30 Minutes Intravenous Once 10/09/2019 1631 10/07/2019 1641       Time Spent in minutes  30   Lala Lund M.D on 10/19/2019 at 11:03 AM  To page go to [email or web address] - password [facility]  [facility] -  Office  [phone]  See all Orders from today for further details    Objective:   Vitals:   10/19/19 0542 10/19/19 0600 10/19/19 0727 10/19/19 0858  BP: (!) 98/49 109/66 125/61   Pulse:  (!) 109 90 64  Resp: (!) 34 (!) 30 (!) 24 (!) 30  Temp:   (!) 97.5 F (36.4 C)   TempSrc:   Axillary   SpO2:  (!) 72% 94% 96%  Weight:      Height:  Wt Readings from Last 3 Encounters:  10/13/19 63.6 kg  10/26/2019 68.2 kg  10/06/19 68.2 kg     Intake/Output Summary (Last 24 hours) at 10/19/2019 1103 Last data filed at 10/19/2019 1100 Gross per 24 hour  Intake 4.22 ml  Output --  Net 4.22 ml     Physical Exam  Awake , appears tiered, mildly confused [place].AT,PERRAL Supple Neck,No JVD, No cervical lymphadenopathy appriciated.  Symmetrical Chest wall movement, Good air movement bilaterally, CTAB iRRR,No Gallops, Rubs or new Murmurs, No Parasternal Heave +ve B.Sounds, Abd Soft, No tenderness, No organomegaly appriciated, No rebound - guarding or rigidity. No Cyanosis, Clubbing or edema, No new Rash or bruise    Data Review:    CBC Recent Labs  Lab 10/14/19 0230 10/15/19 0215 10/16/19 0040 10/17/19 0219 10/18/19 0434  WBC 7.6 8.3 8.6 7.9 8.1  HGB 11.3* 12.3 12.5 12.0 12.0  HCT 36.7 38.9 38.9 37.9 38.4  PLT 219 221 220 228 203  MCV 92.4 90.9 89.0 90.0 89.9  MCH 28.5 28.7 28.6 28.5 28.1  MCHC 30.8 31.6 32.1 31.7 31.3  RDW 12.9 13.0 12.7  12.9 12.8  LYMPHSABS 0.4* 0.2* 0.3* 0.2* 0.3*  MONOABS 0.1 0.1 0.2 0.2 0.2  EOSABS 0.0 0.0 0.0 0.0 0.0  BASOSABS 0.0 0.0 0.0 0.0 0.0    Chemistries  Recent Labs  Lab 10/14/19 0230 10/15/19 0215 10/16/19 0040 10/17/19 0219 10/18/19 0434  NA 139 140 140 143 145  K 4.7 4.3 4.0 4.4 3.9  CL 104 103 102 102 103  CO2 '26 24 25 28 30  ' GLUCOSE 145* 146* 172* 190* 166*  BUN 33* 41* 41* 46* 53*  CREATININE 0.69 0.71 0.65 0.69 0.63  CALCIUM 8.7* 8.8* 8.9 8.9 8.9  MG 2.3 2.5* 2.4 2.5* 2.6*  AST '20 21 20 23 17  ' ALT '15 16 18 19 18  ' ALKPHOS 76 93 102 102 101  BILITOT 0.5 0.5 0.5 0.4 0.8   ------------------------------------------------------------------------------------------------------------------ No results for input(s): CHOL, HDL, LDLCALC, TRIG, CHOLHDL, LDLDIRECT in the last 72 hours.  Lab Results  Component Value Date   HGBA1C 5.9 09/11/2019   ------------------------------------------------------------------------------------------------------------------ No results for input(s): TSH, T4TOTAL, T3FREE, THYROIDAB in the last 72 hours.  Invalid input(s): FREET3  Cardiac Enzymes No results for input(s): CKMB, TROPONINI, MYOGLOBIN in the last 168 hours.  Invalid input(s): CK ------------------------------------------------------------------------------------------------------------------    Component Value Date/Time   BNP 46.9 10/18/2019 0434    Micro Results Recent Results (from the past 240 hour(s))  Blood Culture (routine x 2)     Status: None   Collection Time: 10/11/2019  5:29 AM   Specimen: BLOOD  Result Value Ref Range Status   Specimen Description BLOOD LEFT [facility]  Final   Special Requests   Final    BOTTLES DRAWN AEROBIC AND ANAEROBIC Blood Culture adequate volume   Culture   Final    NO GROWTH 5 DAYS Performed at [facility], [address] [place], [place] [address]    Report Status 10/17/2019 FINAL  Final  Blood Culture (routine x 2)     Status:  None   Collection Time: 10/24/2019  5:29 AM   Specimen: BLOOD  Result Value Ref Range Status   Specimen Description BLOOD RIGHT WRIST  Final   Special Requests   Final    BOTTLES DRAWN AEROBIC ONLY Blood Culture results may not be optimal due to an inadequate volume of blood received in culture bottles   Culture   Final  NO GROWTH 5 DAYS Performed at [facility], [address] [place], [place] [address]    Report Status 10/17/2019 FINAL  Final  MRSA PCR Screening     Status: None   Collection Time: 10/13/19  5:40 AM   Specimen: Nasopharyngeal  Result Value Ref Range Status   MRSA by PCR NEGATIVE NEGATIVE Final    Comment:        The GeneXpert MRSA Assay (FDA approved for NASAL specimens only), is one component of a comprehensive MRSA colonization surveillance program. It is not intended to diagnose MRSA infection nor to guide or monitor treatment for MRSA infections. Performed at [facility], [place] [address], [place],  [address]     Radiology Reports DG Chest 1 View  Result Date: 10/31/2019 CLINICAL DATA:  Shortness of breath.  COVID-19 positive EXAM: CHEST  1 VIEW COMPARISON:  09/26/2019 FINDINGS: Bilateral pulmonary infiltrate. Infiltrates were also seen November 2020, but opacity is denser and more widespread today. No edema, effusion, or pneumothorax. Normal heart size. No acute osseous finding. IMPRESSION: Bilateral pneumonia. Electronically Signed   By: Monte Fantasia M.D.   On: 10/20/2019 05:32   CT ANGIO CHEST PE W OR WO CONTRAST  Result Date: 09/27/2019 CLINICAL DATA:  Multiple falls and feeling fatigued. PE suspected. EXAM: CT ANGIOGRAPHY CHEST WITH CONTRAST TECHNIQUE: Multidetector CT imaging of the chest was performed using the standard protocol during bolus administration of intravenous contrast. Multiplanar CT image reconstructions and MIPs were obtained to evaluate the vascular anatomy. CONTRAST:  52m OMNIPAQUE IOHEXOL 350  MG/ML SOLN COMPARISON:  None. FINDINGS: Cardiovascular: Heart is enlarged. No pericardial effusion. Coronary artery calcification is evident. Atherosclerotic calcification is noted in the wall of the thoracic aorta. No filling defect within the opacified pulmonary arteries to suggest the presence of an acute pulmonary embolus. Mediastinum/Nodes: Mediastinal lymph nodes measure up to 8 mm short axis in the pre-vascular window. 9 mm short axis subcarinal lymph node. The esophagus has normal imaging features. Surgical clips are noted in the left axilla. Lungs/Pleura: Lungs demonstrate bilateral patchy ground-glass infiltrates peripherally in both lungs with an upper lung predominance. No pleural effusion. Upper Abdomen: 2.6 cm low-density lesion in the left liver approaches water attenuation and is compatible with a cyst. Musculoskeletal: No worrisome lytic or sclerotic osseous abnormality. Review of the MIP images confirms the above findings. IMPRESSION: 1. No CT evidence for acute pulmonary embolus. 2. Bilateral patchy ground-glass infiltrates peripherally in both lungs with an upper lung predominance. Imaging features are nonspecific but likely related to infectious/inflammatory etiology and atypical or viral pneumonia would be a consideration. 3. Borderline mediastinal lymphadenopathy, likely reactive. 4. Aortic Atherosclerosis (ICD10-I70.0). Electronically Signed   By: EMisty StanleyM.D.   On: 09/27/2019 07:35   DG Chest Port 1 View  Result Date: 09/26/2019 CLINICAL DATA:  Low-grade fever. EXAM: PORTABLE CHEST 1 VIEW COMPARISON:  10/24/2016 FINDINGS: 1300 hours. The cardiopericardial silhouette is within normal limits for size. Interstitial markings are diffusely coarsened with chronic features. Patchy subtle airspace opacities are seen peripherally in both lungs. No associated pleural effusion period bones are demineralized. IMPRESSION: 1. Patchy subtle airspace opacities in the periphery of both lungs, left  greater than right, without pleural effusion. Imaging features compatible with multifocal pneumonia and atypical/viral etiology should be considered. I discussed these findings by telephone with Dr. QJacqualine Codeat approximately 13 20 on 09/26/2019. Electronically Signed   By: EMisty StanleyM.D.   On: 09/26/2019 13:23   ECHOCARDIOGRAM COMPLETE  Result Date: 10/13/2019   ECHOCARDIOGRAM REPORT   Patient Name:   YALONDA SAMPLE [facility] Date of Exam: 10/13/2019 Medical Rec #:  [id number]               Height:       66.0 in Accession #:    [id number]              Weight:       140.3 lb Date of Birth:  June 04, 1935               BSA:          1.72 m Patient Age:    29 years                BP:           113/54 mmHg Patient Gender: F                       HR:           69 bpm. Exam Location:  Inpatient Procedure: 2D Echo, Color Doppler and Cardiac Doppler Indications:    I48.91* Unspecified atrial fibrillation  History:        Patient has prior history of Echocardiogram examinations, most                 recent 04/23/2018. Risk Factors:Hypertension and Dyslipidemia.                 Active COVID-19 Infection at time of study.  Sonographer:    Raquel Sarna Senior RDCS Referring Phys: [place] Chauvin  1. Left ventricular ejection fraction, by visual estimation, is 55 to 60%. The left ventricle has normal function. There is no left ventricular hypertrophy.  2. Left ventricular diastolic parameters are consistent with Grade I diastolic dysfunction (impaired relaxation).  3. The left ventricle has no regional wall motion abnormalities.  4. Global right ventricle has normal systolic function.The right ventricular size is normal. No increase in right ventricular wall thickness.  5. Left atrial size was normal.  6. Right atrial size was normal.  7. The mitral valve is normal in structure. Trace mitral valve regurgitation. No evidence of mitral stenosis.  8. The tricuspid valve is normal in structure. Tricuspid valve regurgitation  is not demonstrated.  9. The aortic valve is tricuspid. Aortic valve regurgitation is mild. Mild aortic valve sclerosis without stenosis. 10. TR signal is inadequate for assessing pulmonary artery systolic pressure. 11. The inferior vena cava is normal in size with greater than 50% respiratory variability, suggesting right atrial pressure of 3 mmHg. FINDINGS  Left Ventricle: Left ventricular ejection fraction, by visual estimation, is 55 to 60%. The left ventricle has normal function. The left ventricle has no regional wall motion abnormalities. There is no left ventricular hypertrophy. Left ventricular diastolic parameters are consistent with Grade I diastolic dysfunction (impaired relaxation). Right Ventricle: The right ventricular size is normal. No increase in right ventricular wall thickness. Global RV systolic function is has normal systolic function. Left Atrium: Left atrial size was normal in size. Right Atrium: Right atrial size was normal in size Pericardium: There is no evidence of pericardial effusion. Mitral Valve: The mitral valve is normal in structure. No evidence of mitral valve stenosis by observation. Trace mitral valve regurgitation. Tricuspid Valve: The tricuspid valve is normal in structure. Tricuspid valve regurgitation is not demonstrated. Aortic Valve: The aortic valve is tricuspid. Aortic valve regurgitation is mild. Mild aortic valve sclerosis is  present, with no evidence of aortic valve stenosis. Pulmonic Valve: The pulmonic valve was normal in structure. Pulmonic valve regurgitation is not visualized. Aorta: The aortic root is normal in size and structure. Venous: The inferior vena cava is normal in size with greater than 50% respiratory variability, suggesting right atrial pressure of 3 mmHg. IAS/Shunts: No atrial level shunt detected by color flow Doppler.  LEFT VENTRICLE PLAX 2D LVIDd:         4.35 cm  Diastology LVIDs:         3.17 cm  LV e' lateral:   6.38 cm/s LV PW:         0.98  cm  LV E/e' lateral: 7.3 LV IVS:        0.79 cm  LV e' medial:    4.06 cm/s LVOT diam:     1.80 cm  LV E/e' medial:  11.5 LV SV:         45 ml LV SV Index:   26.28 LVOT Area:     2.54 cm  RIGHT VENTRICLE RV S prime:     13.20 cm/s TAPSE (M-mode): 2.4 cm LEFT ATRIUM             Index       RIGHT ATRIUM           Index LA diam:        2.70 cm 1.57 cm/m  RA Area:     15.50 cm LA Vol (A2C):   41.8 ml 24.30 ml/m RA Volume:   39.40 ml  22.91 ml/m LA Vol (A4C):   27.0 ml 15.70 ml/m LA Biplane Vol: 36.6 ml 21.28 ml/m  AORTIC VALVE LVOT Vmax:   90.90 cm/s LVOT Vmean:  59.400 cm/s LVOT VTI:    0.177 m  AORTA Ao Root diam: 2.80 cm MITRAL VALVE MV Area (PHT): 2.37 cm             SHUNTS MV PHT:        92.80 msec           Systemic VTI:  0.18 m MV Decel Time: 320 msec             Systemic Diam: 1.80 cm MV E velocity: 46.60 cm/s 103 cm/s MV A velocity: 82.90 cm/s 70.3 cm/s MV E/A ratio:  0.56       1.5  Loralie Champagne MD Electronically signed by Loralie Champagne MD Signature Date/Time: 10/13/2019/3:50:44 PM    Final

## 2019-10-19 NOTE — Progress Notes (Signed)
Family visit:  With the assistance of another nurse on the unit I was able to bring this patient to the family visitation area to allow the daughter a final setting to say goodbye to her mother in. The patient's daughter stayed with the patient and had spoken to her and grieved with the patient and had played her a few videos that were sent by other family members. I was able to answer all questions that the daughter had and remained with the patient for the entirety of the visitation. Once the visitation was complete, the family member was escorted back to her vehicle and the patient was brought back to her room and put back on telemetry.

## 2019-10-19 NOTE — Progress Notes (Signed)
Family Update:   I had called Elaine Price's daughter, Elaine Price, to update her on her mother's condition. We have arranged for a family visit as her mother is transitioning and on comfort care. I had answered all questions that the patient's family member had and had given her clear instructions on how to proceed upon arrival for the family visit with limitations of 30 minutes. No further questions at this time.

## 2019-10-19 NOTE — Progress Notes (Signed)
Called and spoke to daughter, Colletta [place].  Updated on condition and gave opportunity to ask questions. Instructed daughter I would call her with any changes.  Daughter appreciated call from nurse.

## 2019-10-19 NOTE — Ethics Note (Signed)
Rapid response called to the patient's room for hypoxia not improving with him 15 L [place] O2 and 100% nonrebreather.  Patient was tachycardic with A. fib with RVR.  Patient was also complaining of chest pain.  Review of rounding physician note stated patient to be comfort care only in the event of decompensation. -Discussed with patient's daughter Trey Sailors) she agrees with the decision to make a comfort care only, she will make arrangements for his sister and/or brother to come visit. -Comfort care orders placed

## 2019-10-19 NOTE — Significant Event (Signed)
Rapid Response Event Note  Overview: Rapid response called for patient with chest pain.       Initial Focused Assessment: RR RN arrived to patient bedside. EKG in process. Patient HR in the 170s irregular. Nitro had already been given. EKG being attempted.   Patient on 15L NRB and 15L HFNC (salter) satting in the mid 80s-low 90s.   Interventions: Nitro had already been given prior to RR RN arrival. BP soft with SBP in the 80s. Plan to hold another dose of nitro at this time. 0600 dose of cardizem to be given. Morphine 1mg  given per verbal order from Dr. Vanita Ingles.   Per hospitalist note from dayshift, if the patient were to decline full comfort measures and hospice were to be implemented.   Plan of Care (if not transferred): Comfort care measures being implemented by Dr. Vanita Ingles. He is notifying family and entering comfort care order set.       Heide Scales

## 2019-10-19 NOTE — Progress Notes (Signed)
Patient placed on comfort care today.  On Morphine drip as ordered.  Patient displays no signs of discomfort at present.  DNR status maintained

## 2019-10-19 NOTE — Consult Note (Signed)
   [facility] CM Inpatient Consult   10/19/2019  [place] 08/11/1935 [id number]    Patient screened for potential [facility] Care Management serviceswith a 30 day readmission, 25% high risk score for unplanned readmission and hospitalization, as a benefit from her Medicare/ NextGen ACO plan  Chart reviewedand MD brief narrative shows as follows: JudithSelvidgeis an84 y.o.female,with history significant for breast cancer, essential hypertension, dyslipidemia, anemia of chronic disease, diverticulosis without diverticulitis, had recent admission for pneumonia and was diagnosed recently in the outpatient setting with COVID-19 infection 2 days prior to her ER visit In [place] [facility], she was diagnosed with acute hypoxic respiratory failure due to COVID-19 pneumonitis, placed on a nonrebreather mask, started on IV steroids and remdesivir and transferred here to [facility]) for further care.  Patient' s primary care provider is McLean-Scocuzza, Nino Glow., MD with [facility].  Per MD note today, patient is now transitioned to full comfort care on morphine drip, all unnecessary medications will be stopped, goal of care will be comfort.  Will seek residential hospice placement.   No current [facility] Care Management needs for community follow-up identified at this point.    For questions, please contact:  Edwena Felty A. Lajeana Strough, BSN, RN-BC [facility] Liaison Cell: [phone]

## 2019-10-20 MED ORDER — DEXTROSE 5 % IV SOLN: INTRAVENOUS | Status: AC

## 2019-10-20 NOTE — Progress Notes (Signed)
Family Update  Spoke with daughter Colletta [place] and updated her on patients condition/POC.

## 2019-10-20 NOTE — Progress Notes (Signed)
Morphine Waste:  Patient morphine drip discontinued at 0905 this morning. Dr. Candiss Norse ordered primary nurse to monitor patient for discomfort during the shift. Patient calm and comfortable so primary nurse Lonia Mad RN wasted remaining morphine drip 70 ml with staff nurse Junita Push RN into [facility] per facility policy. Primary RN spoke with Peggyann Juba PharmD and [place] F. To confirm waste policy and documentation.

## 2019-10-20 NOTE — Progress Notes (Signed)
I called daughter, Colletta [place], and updated her on her mother's condition.  I also gave daughter opportunity to ask questions.  She thanked me for calling.

## 2019-10-20 NOTE — Progress Notes (Signed)
Report called to [facility] on 3rd floor.  Daughter Elaine Price called and made aware of pending transfer.

## 2019-10-20 NOTE — Progress Notes (Signed)
PROGRESS NOTE                                                                                                                                                                                                             Patient Demographics:    Elaine Price, is a 84 y.o. female, DOB - Jun 29, 1935, FBX:[id number]  Outpatient Primary MD for the patient is McLean-Scocuzza, Nino Glow, MD    LOS - 8  Admit date - 10/11/2019    CC - SOB     Brief Narrative  -  Elaine Price  is a 84 y.o. female, with history significant for breast cancer, essential hypertension, dyslipidemia, anemia of chronic disease, diverticulosis without diverticulitis, recent admission for pneumonia.  Patient otherwise is quite healthy and independent still lives alone and drives, has been sick for around a month now.  Was recently admitted for pneumonia to [facility] hospital and discharged few weeks ago, she was diagnosed recently in the outpatient setting with COVID-19 infection 2 days prior to her ER visit, her daughter also tested positive.  In the [place] ER she was diagnosed with acute hypoxic respiratory failure due to COVID-19 pneumonitis, she was placed on a nonrebreather mask, she was started on IV steroids and remdesivir and transferred here to [facility] for further care.  Patient was admitted to [facility] on 15 L nasal cannula oxygen plus nonrebreather mask, she was appropriately treated, unfortunately her disease and parenchymal damage had progressed too far before she presented here.  She also developed A. fib with RVR, eventually developed encephalopathy and started refusing to eat or drink.  Multiple discussions were made by family members and finally she was transitioned to full comfort care on 10/19/2019.   Subjective:   Patient in bed, currently on morphine drip but sleeping comfortably.  Only requiring 3 L of nasal cannula oxygen.   Assessment  & Plan :     Note patient was admitted for Covid pneumonia, was severely hypoxic requiring both nonrebreather mask plus high flow nasal cannula oxygen 15 L.  She remained this way for several days ultimately was transitioned to comfort care, once she was started on morphine drip her oxygen demand overnight has come down to 3 to 4 L nasal cannula.  I think much of her hypoxia was anxiety related body movements increasing her oxygen demand and BMI.  Once she has calmed down her oxygen demand has gone down about 80%.  Had detailed discussions with patient's daughter plan at this time is to stop morphine drip, gentle hydration, try to manage her with oral medications and see if she does well and remains comfortable, if that is the case she will go to SNF.  Upon waking up she is in any distress and becomes more hypoxic then by all means we will reinstate IV morphine drip and transition her to residential hospice.    Medical issues addressed this admission so far are below.    1. Acute Hypoxic Resp. Failure due to Acute Covid 19 Viral Pneumonitis during the ongoing 2020 Covid 19 Pandemic - She had severe Hypoxia upon arrival from [facility], she was on nonrebreather.  She received IV steroids and remdesivir at [facility] for the first day of her hospital stay, she was getting worse rapidly and had to be given supplemental nasal cannula oxygen on top of nonrebreather.  Upon arrival she received IV Actemra and since then she has shown improvement and she is now on nonrebreather mask with rate of flow around 10L - 15L  with Pox 95%.  Encouraged the patient to sit up in chair in the daytime use I-S and flutter valve for pulmonary toiletry and then prone in bed when at night.  We will continue to titrate the oxygen down, problem is she is not tolerating nasal cannula and keeps on pulling it.  Have added little Seroquel + PRN Haldol as well for anxiety and delirium.   SpO2: 92 % O2 Flow Rate (L/min): 4  L/min FiO2 (%): 100 %  Recent Labs  Lab 10/14/19 0230 10/15/19 0215 10/16/19 0040 10/17/19 0219 10/18/19 0434  CRP 3.8* 2.0* 1.2* 0.8 0.5  DDIMER 6.37* 9.10* 10.97* 8.44* 6.66*  BNP 224.8* 91.1 65.1 70.8 46.9    Hepatic Function Latest Ref Rng & Units 10/18/2019 10/17/2019 10/16/2019  Total Protein 6.5 - 8.1 g/dL 5.9(L) 5.8(L) 6.1(L)  Albumin 3.5 - 5.0 g/dL 3.2(L) 3.0(L) 3.1(L)  AST 15 - 41 U/L _0 ALT 0 - 44 U/L _1 Alk Phosphatase 38 - 126 U/L 101 102 102  Total Bilirubin 0.3 - 1.2 mg/dL 0.8 0.4 0.5  Bilirubin, Direct 0.0 - 0.2 mg/dL - - -      2.  Elevated D-dimer due to inflammation.  CTA is negative but D-dimer is rising despite being on Eliquis, has been switched to full dose Lovenox on 10/15/2019.  3.  New onset A. fib RVR.  Due to pulmonary stress. Stable TSH and echocardiogram with EF 60%, currently on oral amiodarone and diltiazem, needed IV Cardizem drip again on 10/16/2019 which I will taper off on 10/17/2019, also received 2 doses of digoxin on 10/16/2019.  Will try and titrate off Cardizem drip today.  Mali vas 2 score of at least 3.  Currently on Lovenox.  Once D-dimer starts trending down we will switch her back to Eliquis.  4.  GERD.  On PPI.  5.  Mild acute on chr. Grade 1 Diastolic CHF EF 22% -  Mild acute failure, IV Lasix will be repeated again on 10/18/2019.  6.  Anxiety and delirium.  Low-dose Seroquel added on 10/15/2019, PRN Haldol as well, was worse with Xanax.    Condition -  Guarded, Overall if declines any further - full comfort care and Hospice.  Family Communication  : daughter updated 10/13/2019, will call 12/9, 12/10, 12/11, 12/12,  12/13, 12/14, 12/15  Code Status :  DNR  Diet :    Diet Order            DIET SOFT Room service appropriate? Yes; Fluid consistency: Thin  Diet effective now               Disposition Plan  : Full comfort care now.  Residential hospice.  Consults  :    Procedures  :     CTA - No  PE  TTE  1. Left ventricular ejection fraction, by visual estimation, is 55 to 60%. The left ventricle has normal function. There is no left ventricular hypertrophy.  2. Left ventricular diastolic parameters are consistent with Grade I diastolic dysfunction (impaired relaxation).  3. The left ventricle has no regional wall motion abnormalities.  4. Global right ventricle has normal systolic function.The right ventricular size is normal. No increase in right ventricular wall thickness.  5. Left atrial size was normal.  6. Right atrial size was normal.  7. The mitral valve is normal in structure. Trace mitral valve regurgitation. No evidence of mitral stenosis.  8. The tricuspid valve is normal in structure. Tricuspid valve regurgitation is not demonstrated.  9. The aortic valve is tricuspid. Aortic valve regurgitation is mild. Mild aortic valve sclerosis without stenosis. 10. TR signal is inadequate for assessing pulmonary artery systolic pressure. 11. The inferior vena cava is normal in size with greater than 50% respiratory variability, suggesting right atrial pressure of 3 mmHg.   PUD Prophylaxis : PPI  DVT Prophylaxis  :  Lovenox >> Eliquis  Lab Results  Component Value Date   PLT 203 10/18/2019    Inpatient Medications  Scheduled Meds: . amiodarone  100 mg Oral Daily  . Chlorhexidine Gluconate Cloth  6 each Topical Daily  . dextromethorphan-guaiFENesin  2 tablet Oral Q12H  . diltiazem  90 mg Oral Q8H  . enoxaparin (LOVENOX) injection  60 mg Subcutaneous Q12H  . feeding supplement (PRO-STAT SUGAR FREE 64)  30 mL Oral TID WC  . mouth rinse  15 mL Mouth Rinse BID  . pantoprazole  40 mg Oral Daily  . QUEtiapine  12.5 mg Oral BID  . sodium chloride flush  3 mL Intravenous Q12H  . traZODone  25 mg Oral QHS   Continuous Infusions: . dextrose    . morphine 1 mg/hr (10/19/19 2000)   PRN Meds:.acetaminophen, albuterol, bisacodyl, diltiazem, glycopyrrolate **OR** glycopyrrolate  **OR** glycopyrrolate, haloperidol **OR** haloperidol **OR** haloperidol lactate, haloperidol lactate, LORazepam **OR** LORazepam **OR** LORazepam, morphine, nitroGLYCERIN, [DISCONTINUED] ondansetron **OR** ondansetron (ZOFRAN) IV, sodium chloride  Antibiotics  :    Anti-infectives (From admission, onward)   Start     Dose/Rate Route Frequency Ordered Stop   10/13/19 2215  remdesivir 100 mg in sodium chloride 0.9 % 100 mL IVPB     100 mg 200 mL/hr over 30 Minutes Intravenous Once 10/13/19 2158 10/13/19 2326   10/13/19 1000  remdesivir 100 mg in sodium chloride 0.9 % 100 mL IVPB  Status:  Discontinued     100 mg 200 mL/hr over 30 Minutes Intravenous Daily 10/27/2019 1631 10/09/2019 1641   10/13/19 0600  remdesivir 100 mg in sodium chloride 0.9 % 100 mL IVPB  Status:  Discontinued     100 mg 200 mL/hr over 30 Minutes Intravenous Daily 10/26/2019 1643 10/16/19 1504   10/26/2019 1645  remdesivir 200 mg in sodium chloride 0.9% 250 mL IVPB  Status:  Discontinued     200 mg 580  mL/hr over 30 Minutes Intravenous Once 10/13/2019 1631 11/01/2019 1641       Time Spent in minutes  30   Lala Lund M.D on 10/20/2019 at 2:31 PM  To page go to [email or web address] - password [facility]  [facility] -  Office  [phone]  See all Orders from today for further details    Objective:   Vitals:   10/20/19 0000 10/20/19 0400 10/20/19 0749 10/20/19 0900  BP: (!) 95/48 (!) 146/50 136/70   Pulse: 83  87 93  Resp: (!) 9  (!) 24 (!) 8  Temp: (!) 97.5 F (36.4 C) (!) 97.3 F (36.3 C) 97.6 F (36.4 C)   TempSrc: Axillary Axillary Axillary   SpO2: 93%  94% 92%  Weight:      Height:        Wt Readings from Last 3 Encounters:  10/13/19 63.6 kg  10/14/2019 68.2 kg  10/06/19 68.2 kg     Intake/Output Summary (Last 24 hours) at 10/20/2019 1431 Last data filed at 10/20/2019 0900 Gross per 24 hour  Intake 21 ml  Output 0 ml  Net 21 ml     Physical Exam  Sleeping comfortably in no distress, morphine  drip on. Supple Neck,No JVD, No cervical lymphadenopathy appriciated.  Symmetrical Chest wall movement, Good air movement bilaterally, CTAB RRR,No Gallops, Rubs or new Murmurs, No Parasternal Heave +ve B.Sounds, Abd Soft, No tenderness, No organomegaly appriciated, No rebound - guarding or rigidity. No Cyanosis, Clubbing or edema, No new Rash or bruise     Data Review:    CBC Recent Labs  Lab 10/14/19 0230 10/15/19 0215 10/16/19 0040 10/17/19 0219 10/18/19 0434  WBC 7.6 8.3 8.6 7.9 8.1  HGB 11.3* 12.3 12.5 12.0 12.0  HCT 36.7 38.9 38.9 37.9 38.4  PLT 219 221 220 228 203  MCV 92.4 90.9 89.0 90.0 89.9  MCH 28.5 28.7 28.6 28.5 28.1  MCHC 30.8 31.6 32.1 31.7 31.3  RDW 12.9 13.0 12.7 12.9 12.8  LYMPHSABS 0.4* 0.2* 0.3* 0.2* 0.3*  MONOABS 0.1 0.1 0.2 0.2 0.2  EOSABS 0.0 0.0 0.0 0.0 0.0  BASOSABS 0.0 0.0 0.0 0.0 0.0    Chemistries  Recent Labs  Lab 10/14/19 0230 10/15/19 0215 10/16/19 0040 10/17/19 0219 10/18/19 0434  NA 139 140 140 143 145  K 4.7 4.3 4.0 4.4 3.9  CL 104 103 102 102 103  CO2 _0 GLUCOSE 145* 146* 172* 190* 166*  BUN 33* 41* 41* 46* 53*  CREATININE 0.69 0.71 0.65 0.69 0.63  CALCIUM 8.7* 8.8* 8.9 8.9 8.9  MG 2.3 2.5* 2.4 2.5* 2.6*  AST _1 ALT _2 ALKPHOS 76 93 102 102 101  BILITOT 0.5 0.5 0.5 0.4 0.8   ------------------------------------------------------------------------------------------------------------------ No results for input(s): CHOL, HDL, LDLCALC, TRIG, CHOLHDL, LDLDIRECT in the last 72 hours.  Lab Results  Component Value Date   HGBA1C 5.9 09/11/2019   ------------------------------------------------------------------------------------------------------------------ No results for input(s): TSH, T4TOTAL, T3FREE, THYROIDAB in the last 72 hours.  Invalid input(s): FREET3  Cardiac Enzymes No results for input(s): CKMB, TROPONINI, MYOGLOBIN in the last 168 hours.  Invalid input(s):  CK ------------------------------------------------------------------------------------------------------------------    Component Value Date/Time   BNP 46.9 10/18/2019 0434    Micro Results Recent Results (from the past 240 hour(s))  Blood Culture (routine x 2)     Status: None   Collection Time: 10/13/2019  5:29 AM   Specimen: BLOOD  Result  Value Ref Range Status   Specimen Description BLOOD LEFT AC  Final   Special Requests   Final    BOTTLES DRAWN AEROBIC AND ANAEROBIC Blood Culture adequate volume   Culture   Final    NO GROWTH 5 DAYS Performed at [facility] [address] [place], [place] [address]    Report Status 10/17/2019 FINAL  Final  Blood Culture (routine x 2)     Status: None   Collection Time: 10/20/2019  5:29 AM   Specimen: BLOOD  Result Value Ref Range Status   Specimen Description BLOOD RIGHT WRIST  Final   Special Requests   Final    BOTTLES DRAWN AEROBIC ONLY Blood Culture results may not be optimal due to an inadequate volume of blood received in culture bottles   Culture   Final    NO GROWTH 5 DAYS Performed at [facility], [address] [place], [place] [address]    Report Status 10/17/2019 FINAL  Final  MRSA PCR Screening     Status: None   Collection Time: 10/13/19  5:40 AM   Specimen: Nasopharyngeal  Result Value Ref Range Status   MRSA by PCR NEGATIVE NEGATIVE Final    Comment:        The GeneXpert MRSA Assay (FDA approved for NASAL specimens only), is one component of a comprehensive MRSA colonization surveillance program. It is not intended to diagnose MRSA infection nor to guide or monitor treatment for MRSA infections. Performed at [facility], [place] [address] [place], [place] [address]     Radiology Reports DG Chest 1 View  Result Date: 10/11/2019 CLINICAL DATA:  Shortness of breath.  COVID-19 positive EXAM: CHEST  1 VIEW COMPARISON:  09/26/2019 FINDINGS: Bilateral pulmonary infiltrate.  Infiltrates were also seen November 2020, but opacity is denser and more widespread today. No edema, effusion, or pneumothorax. Normal heart size. No acute osseous finding. IMPRESSION: Bilateral pneumonia. Electronically Signed   By: Monte Fantasia M.D.   On: 10/29/2019 05:32   CT ANGIO CHEST PE W OR WO CONTRAST  Result Date: 09/27/2019 CLINICAL DATA:  Multiple falls and feeling fatigued. PE suspected. EXAM: CT ANGIOGRAPHY CHEST WITH CONTRAST TECHNIQUE: Multidetector CT imaging of the chest was performed using the standard protocol during bolus administration of intravenous contrast. Multiplanar CT image reconstructions and MIPs were obtained to evaluate the vascular anatomy. CONTRAST:  8m OMNIPAQUE IOHEXOL 350 MG/ML SOLN COMPARISON:  None. FINDINGS: Cardiovascular: Heart is enlarged. No pericardial effusion. Coronary artery calcification is evident. Atherosclerotic calcification is noted in the wall of the thoracic aorta. No filling defect within the opacified pulmonary arteries to suggest the presence of an acute pulmonary embolus. Mediastinum/Nodes: Mediastinal lymph nodes measure up to 8 mm short axis in the pre-vascular window. 9 mm short axis subcarinal lymph node. The esophagus has normal imaging features. Surgical clips are noted in the left axilla. Lungs/Pleura: Lungs demonstrate bilateral patchy ground-glass infiltrates peripherally in both lungs with an upper lung predominance. No pleural effusion. Upper Abdomen: 2.6 cm low-density lesion in the left liver approaches water attenuation and is compatible with a cyst. Musculoskeletal: No worrisome lytic or sclerotic osseous abnormality. Review of the MIP images confirms the above findings. IMPRESSION: 1. No CT evidence for acute pulmonary embolus. 2. Bilateral patchy ground-glass infiltrates peripherally in both lungs with an upper lung predominance. Imaging features are nonspecific but likely related to infectious/inflammatory etiology and atypical  or viral pneumonia would be a consideration. 3. Borderline mediastinal lymphadenopathy, likely  reactive. 4. Aortic Atherosclerosis (ICD10-I70.0). Electronically Signed   By: Misty Stanley M.D.   On: 09/27/2019 07:35   DG Chest Port 1 View  Result Date: 09/26/2019 CLINICAL DATA:  Low-grade fever. EXAM: PORTABLE CHEST 1 VIEW COMPARISON:  10/24/2016 FINDINGS: 1300 hours. The cardiopericardial silhouette is within normal limits for size. Interstitial markings are diffusely coarsened with chronic features. Patchy subtle airspace opacities are seen peripherally in both lungs. No associated pleural effusion period bones are demineralized. IMPRESSION: 1. Patchy subtle airspace opacities in the periphery of both lungs, left greater than right, without pleural effusion. Imaging features compatible with multifocal pneumonia and atypical/viral etiology should be considered. I discussed these findings by telephone with Dr. Jacqualine Code at approximately 13 20 on 09/26/2019. Electronically Signed   By: Misty Stanley M.D.   On: 09/26/2019 13:23   ECHOCARDIOGRAM COMPLETE  Result Date: 10/13/2019   ECHOCARDIOGRAM REPORT   Patient Name:   RENESHIA ZUCCARO [facility] Date of Exam: 10/13/2019 Medical Rec #:  [id number]               Height:       66.0 in Accession #:    [id number]              Weight:       140.3 lb Date of Birth:  Jul 09, 1935               BSA:          1.72 m Patient Age:    64 years                BP:           113/54 mmHg Patient Gender: F                       HR:           69 bpm. Exam Location:  Inpatient Procedure: 2D Echo, Color Doppler and Cardiac Doppler Indications:    I48.91* Unspecified atrial fibrillation  History:        Patient has prior history of Echocardiogram examinations, most                 recent 04/23/2018. Risk Factors:Hypertension and Dyslipidemia.                 Active COVID-19 Infection at time of study.  Sonographer:    Raquel Sarna Senior RDCS Referring Phys: [place]  1.  Left ventricular ejection fraction, by visual estimation, is 55 to 60%. The left ventricle has normal function. There is no left ventricular hypertrophy.  2. Left ventricular diastolic parameters are consistent with Grade I diastolic dysfunction (impaired relaxation).  3. The left ventricle has no regional wall motion abnormalities.  4. Global right ventricle has normal systolic function.The right ventricular size is normal. No increase in right ventricular wall thickness.  5. Left atrial size was normal.  6. Right atrial size was normal.  7. The mitral valve is normal in structure. Trace mitral valve regurgitation. No evidence of mitral stenosis.  8. The tricuspid valve is normal in structure. Tricuspid valve regurgitation is not demonstrated.  9. The aortic valve is tricuspid. Aortic valve regurgitation is mild. Mild aortic valve sclerosis without stenosis. 10. TR signal is inadequate for assessing pulmonary artery systolic pressure. 11. The inferior vena cava is normal in size with greater than 50% respiratory variability, suggesting right atrial pressure of 3 mmHg. FINDINGS  Left Ventricle: Left ventricular  ejection fraction, by visual estimation, is 55 to 60%. The left ventricle has normal function. The left ventricle has no regional wall motion abnormalities. There is no left ventricular hypertrophy. Left ventricular diastolic parameters are consistent with Grade I diastolic dysfunction (impaired relaxation). Right Ventricle: The right ventricular size is normal. No increase in right ventricular wall thickness. Global RV systolic function is has normal systolic function. Left Atrium: Left atrial size was normal in size. Right Atrium: Right atrial size was normal in size Pericardium: There is no evidence of pericardial effusion. Mitral Valve: The mitral valve is normal in structure. No evidence of mitral valve stenosis by observation. Trace mitral valve regurgitation. Tricuspid Valve: The tricuspid valve is  normal in structure. Tricuspid valve regurgitation is not demonstrated. Aortic Valve: The aortic valve is tricuspid. Aortic valve regurgitation is mild. Mild aortic valve sclerosis is present, with no evidence of aortic valve stenosis. Pulmonic Valve: The pulmonic valve was normal in structure. Pulmonic valve regurgitation is not visualized. Aorta: The aortic root is normal in size and structure. Venous: The inferior vena cava is normal in size with greater than 50% respiratory variability, suggesting right atrial pressure of 3 mmHg. IAS/Shunts: No atrial level shunt detected by color flow Doppler.  LEFT VENTRICLE PLAX 2D LVIDd:         4.35 cm  Diastology LVIDs:         3.17 cm  LV e' lateral:   6.38 cm/s LV PW:         0.98 cm  LV E/e' lateral: 7.3 LV IVS:        0.79 cm  LV e' medial:    4.06 cm/s LVOT diam:     1.80 cm  LV E/e' medial:  11.5 LV SV:         45 ml LV SV Index:   26.28 LVOT Area:     2.54 cm  RIGHT VENTRICLE RV S prime:     13.20 cm/s TAPSE (M-mode): 2.4 cm LEFT ATRIUM             Index       RIGHT ATRIUM           Index LA diam:        2.70 cm 1.57 cm/m  RA Area:     15.50 cm LA Vol (A2C):   41.8 ml 24.30 ml/m RA Volume:   39.40 ml  22.91 ml/m LA Vol (A4C):   27.0 ml 15.70 ml/m LA Biplane Vol: 36.6 ml 21.28 ml/m  AORTIC VALVE LVOT Vmax:   90.90 cm/s LVOT Vmean:  59.400 cm/s LVOT VTI:    0.177 m  AORTA Ao Root diam: 2.80 cm MITRAL VALVE MV Area (PHT): 2.37 cm             SHUNTS MV PHT:        92.80 msec           Systemic VTI:  0.18 m MV Decel Time: 320 msec             Systemic Diam: 1.80 cm MV E velocity: 46.60 cm/s 103 cm/s MV A velocity: 82.90 cm/s 70.3 cm/s MV E/A ratio:  0.56       1.5  Loralie Champagne MD Electronically signed by Loralie Champagne MD Signature Date/Time: 10/13/2019/3:50:44 PM    Final

## 2019-10-21 DIAGNOSIS — L8990 Pressure ulcer of unspecified site, unspecified stage: Secondary | ICD-10-CM

## 2019-10-21 MED ORDER — LORAZEPAM 0.5 MG PO TABS: 2.0000 mg | ORAL_TABLET | ORAL | Status: DC | PRN

## 2019-10-21 MED ORDER — MORPHINE BOLUS VIA INFUSION
2.0000 mg | INTRAVENOUS | Status: DC | PRN
  Filled 2019-10-21: qty 2

## 2019-10-21 MED ORDER — LORAZEPAM 2 MG/ML PO CONC: 2.0000 mg | ORAL | Status: DC | PRN

## 2019-10-21 MED ORDER — LORAZEPAM 2 MG/ML IJ SOLN
2.0000 mg | Freq: Once | INTRAMUSCULAR | Status: AC
  Administered 2019-10-21: 2 mg via INTRAVENOUS

## 2019-10-21 MED ORDER — LORAZEPAM 2 MG/ML IJ SOLN: 2.0000 mg | INTRAMUSCULAR | Status: DC | PRN

## 2019-10-21 NOTE — Progress Notes (Signed)
O2 increased to 15L HFNC

## 2019-10-21 NOTE — Progress Notes (Signed)
Patient transferred to 3rd floor under comfort care measures. Patient agitated and writhing in the bed and sats 63%, patient on 15L HFNC, Dr. Shanon Brow notified, and orders received to restart the morphine gtt, and administer ativan.

## 2019-10-21 NOTE — Progress Notes (Signed)
PROGRESS NOTE                                                                                                                                                                                                             Patient Demographics:    Elaine Price, is a 84 y.o. female, DOB - [birth year], OIZ:[id number]  Outpatient Primary MD for the patient is McLean-Scocuzza, Nino Glow, MD    LOS - 9  Admit date - 10/29/2019    CC - SOB     Brief Narrative  -  Elaine Price  is a 84 y.o. female, with history significant for breast cancer, essential hypertension, dyslipidemia, anemia of chronic disease, diverticulosis without diverticulitis, recent admission for pneumonia.  Patient otherwise is quite healthy and independent still lives alone and drives, has been sick for around a month now.  Was recently admitted for pneumonia to [facility] hospital and discharged few weeks ago, she was diagnosed recently in the outpatient setting with COVID-19 infection 2 days prior to her ER visit, her daughter also tested positive.  In the [place] ER she was diagnosed with acute hypoxic respiratory failure due to COVID-19 pneumonitis, she was placed on a nonrebreather mask, she was started on IV steroids and remdesivir and transferred here to [facility] for further care.  Patient was admitted to [facility] on 15 L nasal cannula oxygen plus nonrebreather mask, she was appropriately treated, unfortunately her disease and parenchymal damage had progressed too far before she presented here.  She also developed A. fib with RVR, eventually developed encephalopathy and started refusing to eat or drink.  Multiple discussions were made by family members and finally she was transitioned to full comfort care on 10/19/2019.   Subjective:   Patient in bed, currently on morphine drip but sleeping comfortably.  Only requiring 3 L of nasal cannula oxygen.   Assessment  & Plan :    Note patient was admitted for Covid pneumonia, was severely  hypoxic requiring both nonrebreather mask plus high flow nasal cannula oxygen 15 L.  She remained this way for several days ultimately was transitioned to comfort care, once she was started on morphine drip her oxygen demand overnight has come down to 3 to 4 L nasal cannula.  I think much of her hypoxia was anxiety related body movements increasing her oxygen demand and BMI.  Patient continues on morphine drip in the setting of comfort measures with worsening respiratory status but appears comfortable which is our goal.  Family has been made aware, able to visit yesterday.  Currently awaiting possible disposition to hospice however given patient's ongoing decline suspect in-house decompensation at this point.    Medical issues addressed this admission so far are below.    Acute Hypoxic Resp. Failure due to Acute Covid 19 Viral Pneumonitis during the ongoing 2020 Covid 19 Pandemic - She had severe Hypoxia upon arrival from [facility], she was on nonrebreather.  She received IV steroids and remdesivir at [facility] for the first day of her hospital stay, she was getting worse rapidly and had to be given supplemental nasal cannula oxygen on top of nonrebreather.  Upon arrival she received IV Actemra and since then she has shown improvement and she is now on nonrebreather mask with rate of flow around 10L - 15L  with Pox 95%. Encouraged the patient to sit up in chair in the daytime use I-S and flutter valve for pulmonary toiletry and then prone in bed when at night.  We will continue to titrate the oxygen down, problem is she is not tolerating nasal cannula and keeps on pulling it.  Have added little Seroquel + PRN Haldol as well for anxiety and delirium.   SpO2: (!) 84 % O2 Flow Rate (L/min): 15 L/min FiO2 (%): 100 %  Recent Labs  Lab 10/15/19 0215 10/16/19 0040 10/17/19 0219 10/18/19 0434  CRP 2.0* 1.2* 0.8 0.5  DDIMER 9.10* 10.97* 8.44* 6.66*  BNP 91.1 65.1 70.8 46.9    Hepatic  Function Latest Ref Rng & Units 10/18/2019 10/17/2019 10/16/2019  Total Protein 6.5 - 8.1 g/dL 5.9(L) 5.8(L) 6.1(L)  Albumin 3.5 - 5.0 g/dL 3.2(L) 3.0(L) 3.1(L)  AST 15 - 41 U/L _0 ALT 0 - 44 U/L _1 Alk Phosphatase 38 - 126 U/L 101 102 102  Total Bilirubin 0.3 - 1.2 mg/dL 0.8 0.4 0.5  Bilirubin, Direct 0.0 - 0.2 mg/dL - - -      Elevated D-dimer due to inflammation.  CTA is negative but D-dimer is rising despite being on Eliquis, has been switched to full dose Lovenox on 10/15/2019.  New onset A. fib RVR.  Due to pulmonary stress. Stable TSH and echocardiogram with EF 60%, currently on oral amiodarone and diltiazem, needed IV Cardizem drip again on 10/16/2019 which I will taper off on 10/17/2019, also received 2 doses of digoxin on 10/16/2019.  Will try and titrate off Cardizem drip today.  Mali vas 2 score of at least 3.  Currently on Lovenox.  Once D-dimer starts trending down we will switch her back to Eliquis.  GERD.  On PPI.  Mild acute on chr. Grade 1 Diastolic CHF EF 60% -  Mild acute failure, IV Lasix will be repeated again on 10/18/2019.  Anxiety and delirium.  Low-dose Seroquel added on 10/15/2019, PRN Haldol as well, was worse with Xanax.    Condition -  Guarded, Overall if declines any further - full comfort care and Hospice.  Family Communication  : daughter updated 10/13/2019, will call 12/9, 12/10, 12/11, 12/12, 12/13, 12/14, 12/15  Code Status :  DNR  Diet :    Diet Order            DIET SOFT Room service appropriate? Yes; Fluid consistency: Thin  Diet effective now               Disposition Plan  : Full comfort care now.  Residential hospice.  Procedures  :     CTA - No PE  TTE  1. Left ventricular ejection  fraction, by visual estimation, is 55 to 60%. The left ventricle has normal function. There is no left ventricular hypertrophy.  2. Left ventricular diastolic parameters are consistent with Grade I diastolic dysfunction (impaired  relaxation).  3. The left ventricle has no regional wall motion abnormalities.  4. Global right ventricle has normal systolic function.The right ventricular size is normal. No increase in right ventricular wall thickness.  5. Left atrial size was normal.  6. Right atrial size was normal.  7. The mitral valve is normal in structure. Trace mitral valve regurgitation. No evidence of mitral stenosis.  8. The tricuspid valve is normal in structure. Tricuspid valve regurgitation is not demonstrated.  9. The aortic valve is tricuspid. Aortic valve regurgitation is mild. Mild aortic valve sclerosis without stenosis. 10. TR signal is inadequate for assessing pulmonary artery systolic pressure. 11. The inferior vena cava is normal in size with greater than 50% respiratory variability, suggesting right atrial pressure of 3 mmHg.   PUD Prophylaxis : PPI  DVT Prophylaxis  :  Lovenox >> Eliquis  Lab Results  Component Value Date   PLT 203 10/18/2019    Inpatient Medications  Scheduled Meds: . amiodarone  100 mg Oral Daily  . Chlorhexidine Gluconate Cloth  6 each Topical Daily  . dextromethorphan-guaiFENesin  2 tablet Oral Q12H  . diltiazem  90 mg Oral Q8H  . enoxaparin (LOVENOX) injection  60 mg Subcutaneous Q12H  . feeding supplement (PRO-STAT SUGAR FREE 64)  30 mL Oral TID WC  . mouth rinse  15 mL Mouth Rinse BID  . pantoprazole  40 mg Oral Daily  . QUEtiapine  12.5 mg Oral BID  . sodium chloride flush  3 mL Intravenous Q12H  . traZODone  25 mg Oral QHS   Continuous Infusions: . morphine 1 mg/hr (10/21/19 0648)   PRN Meds:.acetaminophen, albuterol, bisacodyl, diltiazem, glycopyrrolate **OR** glycopyrrolate **OR** glycopyrrolate, haloperidol **OR** haloperidol **OR** haloperidol lactate, haloperidol lactate, LORazepam **OR** LORazepam **OR** LORazepam, morphine, nitroGLYCERIN, [DISCONTINUED] ondansetron **OR** ondansetron (ZOFRAN) IV, sodium chloride  Antibiotics  :    Anti-infectives  (From admission, onward)   Start     Dose/Rate Route Frequency Ordered Stop   10/13/19 2215  remdesivir 100 mg in sodium chloride 0.9 % 100 mL IVPB     100 mg 200 mL/hr over 30 Minutes Intravenous Once 10/13/19 2158 10/13/19 2326   10/13/19 1000  remdesivir 100 mg in sodium chloride 0.9 % 100 mL IVPB  Status:  Discontinued     100 mg 200 mL/hr over 30 Minutes Intravenous Daily 11/01/2019 1631 10/15/2019 1641   10/13/19 0600  remdesivir 100 mg in sodium chloride 0.9 % 100 mL IVPB  Status:  Discontinued     100 mg 200 mL/hr over 30 Minutes Intravenous Daily 10/26/2019 1643 10/16/19 1504   10/06/2019 1645  remdesivir 200 mg in sodium chloride 0.9% 250 mL IVPB  Status:  Discontinued     200 mg 580 mL/hr over 30 Minutes Intravenous Once 10/08/2019 1631 10/29/2019 1641       Time Spent in minutes  30   Little Ishikawa M.D on 10/21/2019 at 5:49 PM  To page go to [email or web address] - password [facility]  [facility] -  Office  [phone]  See all Orders from today for further details    Objective:   Vitals:   10/20/19 1020 10/20/19 2000 10/21/19 0000 10/21/19 0400  BP:  140/69 (!) 132/56 105/62  Pulse: 84 80 80 96  Resp: _0 20  Temp:  (!) 97.1 F (36.2 C) (!) 97.3 F (36.3 C) 97.7 F (36.5 C)  TempSrc:  Axillary Axillary Axillary  SpO2: (!) 71% (!) 89% 91% (!) 84%  Weight:      Height:        Wt Readings from Last 3 Encounters:  10/13/19 63.6 kg  11/03/2019 68.2 kg  10/06/19 68.2 kg     Intake/Output Summary (Last 24 hours) at 10/21/2019 1749 Last data filed at 10/21/2019 0657 Gross per 24 hour  Intake 1142.52 ml  Output 1100 ml  Net 42.52 ml     Physical Exam  Sleeping comfortably in no distress, morphine drip on. Supple Neck,No JVD, No cervical lymphadenopathy appriciated.  Symmetrical Chest wall movement, Good air movement bilaterally, CTAB RRR,No Gallops, Rubs or new Murmurs, No Parasternal Heave +ve B.Sounds, Abd Soft, No tenderness, No organomegaly  appriciated, No rebound - guarding or rigidity. No Cyanosis, Clubbing or edema, No new Rash or bruise     Data Review:    CBC Recent Labs  Lab 10/15/19 0215 10/16/19 0040 10/17/19 0219 10/18/19 0434  WBC 8.3 8.6 7.9 8.1  HGB 12.3 12.5 12.0 12.0  HCT 38.9 38.9 37.9 38.4  PLT 221 220 228 203  MCV 90.9 89.0 90.0 89.9  MCH 28.7 28.6 28.5 28.1  MCHC 31.6 32.1 31.7 31.3  RDW 13.0 12.7 12.9 12.8  LYMPHSABS 0.2* 0.3* 0.2* 0.3*  MONOABS 0.1 0.2 0.2 0.2  EOSABS 0.0 0.0 0.0 0.0  BASOSABS 0.0 0.0 0.0 0.0    Chemistries  Recent Labs  Lab 10/15/19 0215 10/16/19 0040 10/17/19 0219 10/18/19 0434  NA 140 140 143 145  K 4.3 4.0 4.4 3.9  CL 103 102 102 103  CO2 _0 GLUCOSE 146* 172* 190* 166*  BUN 41* 41* 46* 53*  CREATININE 0.71 0.65 0.69 0.63  CALCIUM 8.8* 8.9 8.9 8.9  MG 2.5* 2.4 2.5* 2.6*  AST _1 ALT _2 ALKPHOS 93 102 102 101  BILITOT 0.5 0.5 0.4 0.8   ------------------------------------------------------------------------------------------------------------------ No results for input(s): CHOL, HDL, LDLCALC, TRIG, CHOLHDL, LDLDIRECT in the last 72 hours.  Lab Results  Component Value Date   HGBA1C 5.9 09/11/2019   ------------------------------------------------------------------------------------------------------------------ No results for input(s): TSH, T4TOTAL, T3FREE, THYROIDAB in the last 72 hours.  Invalid input(s): FREET3  Cardiac Enzymes No results for input(s): CKMB, TROPONINI, MYOGLOBIN in the last 168 hours.  Invalid input(s): CK ------------------------------------------------------------------------------------------------------------------    Component Value Date/Time   BNP 46.9 10/18/2019 0434    Micro Results Recent Results (from the past 240 hour(s))  Blood Culture (routine x 2)     Status: None   Collection Time: 10/19/2019  5:29 AM   Specimen: BLOOD  Result Value Ref Range Status   Specimen Description  BLOOD LEFT [facility]  Final   Special Requests   Final    BOTTLES DRAWN AEROBIC AND ANAEROBIC Blood Culture adequate volume   Culture   Final    NO GROWTH 5 DAYS Performed at [facility], [address], [place],  [address]    Report Status 10/17/2019 FINAL  Final  Blood Culture (routine x 2)     Status: None   Collection Time: 10/19/2019  5:29 AM   Specimen: BLOOD  Result Value Ref Range Status   Specimen Description BLOOD RIGHT WRIST  Final   Special Requests   Final    BOTTLES DRAWN AEROBIC ONLY Blood Culture results may not be  optimal due to an inadequate volume of blood received in culture bottles   Culture   Final    NO GROWTH 5 DAYS Performed at [facility], [address] [place], [place] [address]    Report Status 10/17/2019 FINAL  Final  MRSA PCR Screening     Status: None   Collection Time: 10/13/19  5:40 AM   Specimen: Nasopharyngeal  Result Value Ref Range Status   MRSA by PCR NEGATIVE NEGATIVE Final    Comment:        The GeneXpert MRSA Assay (FDA approved for NASAL specimens only), is one component of a comprehensive MRSA colonization surveillance program. It is not intended to diagnose MRSA infection nor to guide or monitor treatment for MRSA infections. Performed at [facility], [place] [address], [place], [place] [address]     Radiology Reports DG Chest 1 View  Result Date: 10/25/2019 CLINICAL DATA:  Shortness of breath.  COVID-19 positive EXAM: CHEST  1 VIEW COMPARISON:  09/26/2019 FINDINGS: Bilateral pulmonary infiltrate. Infiltrates were also seen November 2020, but opacity is denser and more widespread today. No edema, effusion, or pneumothorax. Normal heart size. No acute osseous finding. IMPRESSION: Bilateral pneumonia. Electronically Signed   By: Monte Fantasia M.D.   On: 10/29/2019 05:32   CT ANGIO CHEST PE W OR WO CONTRAST  Result Date: 09/27/2019 CLINICAL DATA:  Multiple falls and feeling fatigued. PE  suspected. EXAM: CT ANGIOGRAPHY CHEST WITH CONTRAST TECHNIQUE: Multidetector CT imaging of the chest was performed using the standard protocol during bolus administration of intravenous contrast. Multiplanar CT image reconstructions and MIPs were obtained to evaluate the vascular anatomy. CONTRAST:  26m OMNIPAQUE IOHEXOL 350 MG/ML SOLN COMPARISON:  None. FINDINGS: Cardiovascular: Heart is enlarged. No pericardial effusion. Coronary artery calcification is evident. Atherosclerotic calcification is noted in the wall of the thoracic aorta. No filling defect within the opacified pulmonary arteries to suggest the presence of an acute pulmonary embolus. Mediastinum/Nodes: Mediastinal lymph nodes measure up to 8 mm short axis in the pre-vascular window. 9 mm short axis subcarinal lymph node. The esophagus has normal imaging features. Surgical clips are noted in the left axilla. Lungs/Pleura: Lungs demonstrate bilateral patchy ground-glass infiltrates peripherally in both lungs with an upper lung predominance. No pleural effusion. Upper Abdomen: 2.6 cm low-density lesion in the left liver approaches water attenuation and is compatible with a cyst. Musculoskeletal: No worrisome lytic or sclerotic osseous abnormality. Review of the MIP images confirms the above findings. IMPRESSION: 1. No CT evidence for acute pulmonary embolus. 2. Bilateral patchy ground-glass infiltrates peripherally in both lungs with an upper lung predominance. Imaging features are nonspecific but likely related to infectious/inflammatory etiology and atypical or viral pneumonia would be a consideration. 3. Borderline mediastinal lymphadenopathy, likely reactive. 4. Aortic Atherosclerosis (ICD10-I70.0). Electronically Signed   By: EMisty StanleyM.D.   On: 09/27/2019 07:35   DG Chest Port 1 View  Result Date: 09/26/2019 CLINICAL DATA:  Low-grade fever. EXAM: PORTABLE CHEST 1 VIEW COMPARISON:  10/24/2016 FINDINGS: 1300 hours. The cardiopericardial  silhouette is within normal limits for size. Interstitial markings are diffusely coarsened with chronic features. Patchy subtle airspace opacities are seen peripherally in both lungs. No associated pleural effusion period bones are demineralized. IMPRESSION: 1. Patchy subtle airspace opacities in the periphery of both lungs, left greater than right, without pleural effusion. Imaging features compatible with multifocal pneumonia and atypical/viral etiology should be considered. I discussed these findings by telephone with Dr. QJacqualine Codeat approximately [phone]  on 09/26/2019. Electronically Signed   By: Misty Stanley M.D.   On: 09/26/2019 13:23   ECHOCARDIOGRAM COMPLETE  Result Date: 10/13/2019   ECHOCARDIOGRAM REPORT   Patient Name:   KARLISA GAUBERT [facility] Date of Exam: 10/13/2019 Medical Rec #:  [id number]               Height:       66.0 in Accession #:    [id number]              Weight:       140.3 lb Date of Birth:  [birth year]               BSA:          1.72 m Patient Age:    28 years                BP:           113/54 mmHg Patient Gender: F                       HR:           69 bpm. Exam Location:  Inpatient Procedure: 2D Echo, Color Doppler and Cardiac Doppler Indications:    I48.91* Unspecified atrial fibrillation  History:        Patient has prior history of Echocardiogram examinations, most                 recent 04/23/2018. Risk Factors:Hypertension and Dyslipidemia.                 Active COVID-19 Infection at time of study.  Sonographer:    Raquel Sarna Senior RDCS Referring Phys: [place]  1. Left ventricular ejection fraction, by visual estimation, is 55 to 60%. The left ventricle has normal function. There is no left ventricular hypertrophy.  2. Left ventricular diastolic parameters are consistent with Grade I diastolic dysfunction (impaired relaxation).  3. The left ventricle has no regional wall motion abnormalities.  4. Global right ventricle has normal systolic function.The right  ventricular size is normal. No increase in right ventricular wall thickness.  5. Left atrial size was normal.  6. Right atrial size was normal.  7. The mitral valve is normal in structure. Trace mitral valve regurgitation. No evidence of mitral stenosis.  8. The tricuspid valve is normal in structure. Tricuspid valve regurgitation is not demonstrated.  9. The aortic valve is tricuspid. Aortic valve regurgitation is mild. Mild aortic valve sclerosis without stenosis. 10. TR signal is inadequate for assessing pulmonary artery systolic pressure. 11. The inferior vena cava is normal in size with greater than 50% respiratory variability, suggesting right atrial pressure of 3 mmHg. FINDINGS  Left Ventricle: Left ventricular ejection fraction, by visual estimation, is 55 to 60%. The left ventricle has normal function. The left ventricle has no regional wall motion abnormalities. There is no left ventricular hypertrophy. Left ventricular diastolic parameters are consistent with Grade I diastolic dysfunction (impaired relaxation). Right Ventricle: The right ventricular size is normal. No increase in right ventricular wall thickness. Global RV systolic function is has normal systolic function. Left Atrium: Left atrial size was normal in size. Right Atrium: Right atrial size was normal in size Pericardium: There is no evidence of pericardial effusion. Mitral Valve: The mitral valve is normal in structure. No evidence of mitral valve stenosis by observation. Trace mitral valve regurgitation. Tricuspid Valve: The tricuspid valve is normal in structure. Tricuspid valve  regurgitation is not demonstrated. Aortic Valve: The aortic valve is tricuspid. Aortic valve regurgitation is mild. Mild aortic valve sclerosis is present, with no evidence of aortic valve stenosis. Pulmonic Valve: The pulmonic valve was normal in structure. Pulmonic valve regurgitation is not visualized. Aorta: The aortic root is normal in size and structure.  Venous: The inferior vena cava is normal in size with greater than 50% respiratory variability, suggesting right atrial pressure of 3 mmHg. IAS/Shunts: No atrial level shunt detected by color flow Doppler.  LEFT VENTRICLE PLAX 2D LVIDd:         4.35 cm  Diastology LVIDs:         3.17 cm  LV e' lateral:   6.38 cm/s LV PW:         0.98 cm  LV E/e' lateral: 7.3 LV IVS:        0.79 cm  LV e' medial:    4.06 cm/s LVOT diam:     1.80 cm  LV E/e' medial:  11.5 LV SV:         45 ml LV SV Index:   26.28 LVOT Area:     2.54 cm  RIGHT VENTRICLE RV S prime:     13.20 cm/s TAPSE (M-mode): 2.4 cm LEFT ATRIUM             Index       RIGHT ATRIUM           Index LA diam:        2.70 cm 1.57 cm/m  RA Area:     15.50 cm LA Vol (A2C):   41.8 ml 24.30 ml/m RA Volume:   39.40 ml  22.91 ml/m LA Vol (A4C):   27.0 ml 15.70 ml/m LA Biplane Vol: 36.6 ml 21.28 ml/m  AORTIC VALVE LVOT Vmax:   90.90 cm/s LVOT Vmean:  59.400 cm/s LVOT VTI:    0.177 m  AORTA Ao Root diam: 2.80 cm MITRAL VALVE MV Area (PHT): 2.37 cm             SHUNTS MV PHT:        92.80 msec           Systemic VTI:  0.18 m MV Decel Time: 320 msec             Systemic Diam: 1.80 cm MV E velocity: 46.60 cm/s 103 cm/s MV A velocity: 82.90 cm/s 70.3 cm/s MV E/A ratio:  0.56       1.5  Loralie Champagne MD Electronically signed by Loralie Champagne MD Signature Date/Time: 10/13/2019/3:50:44 PM    Final

## 2019-10-21 NOTE — Progress Notes (Signed)
Patient transferred to 3rd floor with assist of RN an NT.

## 2019-10-22 NOTE — Progress Notes (Signed)
Notified daughter of progress.  All questions were answered and this nurse's contact number shared for further communication.   

## 2019-10-22 NOTE — Plan of Care (Signed)
Called and s/w Daughter to update 2x on POC and patient current status.  Also assisted with facetime.  No further questions at this time,

## 2019-10-22 NOTE — Progress Notes (Signed)
PROGRESS NOTE                                                                                                                                                                                                             Patient Demographics:    Elaine Price, is a 84 y.o. female, DOB - 07-20-1935, [id number]  Outpatient Primary MD for the patient is McLean-Scocuzza, Nino Glow, MD    LOS - 10  Admit date - 10/22/2019    CC - SOB     Brief Narrative  -  Elaine Price  is a 84 y.o. female, with history significant for breast cancer, essential hypertension, dyslipidemia, anemia of chronic disease, diverticulosis without diverticulitis, recent admission for pneumonia.  Patient otherwise is quite healthy and independent still lives alone and drives, has been sick for around a month now.  Was recently admitted for pneumonia to [facility] hospital and discharged few weeks ago, she was diagnosed recently in the outpatient setting with COVID-19 infection 2 days prior to her ER visit, her daughter also tested positive.  In the [place] ER she was diagnosed with acute hypoxic respiratory failure due to COVID-19 pneumonitis, she was placed on a nonrebreather mask, she was started on IV steroids and remdesivir and transferred here to [facility] for further care.  Patient was admitted to [facility] on 15 L nasal cannula oxygen plus nonrebreather mask, she was appropriately treated, unfortunately her disease and parenchymal damage had progressed too far before she presented here.  She also developed A. fib with RVR, eventually developed encephalopathy and started refusing to eat or drink.  Multiple discussions were made by family members and finally she was transitioned to full comfort care on 10/19/2019.   Subjective:   Patient in bed, currently on morphine drip but sleeping comfortably, respirations at 5.   Assessment  & Plan :    Note patient was admitted for Covid pneumonia, was severely hypoxic requiring both  nonrebreather mask plus high flow nasal cannula oxygen 15 L.  She remained this way for several days ultimately was transitioned to comfort care, once she was started on morphine drip her oxygen demand overnight has come down to 3 to 4 L nasal cannula.  I think much of her hypoxia was anxiety related body movements increasing her oxygen demand and BMI.  Patient continues on morphine drip in the setting of comfort measures with worsening respiratory status but appears comfortable which is our goal.  Family has been made aware, able to visit yesterday.  Currently awaiting possible disposition to hospice  however given patient's ongoing decline suspect in-house decompensation at this point.    Medical issues addressed this admission so far are below.    Acute Hypoxic Resp. Failure due to Acute Covid 19 Viral Pneumonitis during the ongoing 2020 Covid 19 Pandemic - She had severe Hypoxia upon arrival from [facility], she was on nonrebreather.  She received IV steroids and remdesivir at [facility] for the first day of her hospital stay, she was getting worse rapidly and had to be given supplemental nasal cannula oxygen on top of nonrebreather.  Upon arrival she received IV Actemra and since then she has shown minimal improvement but continues to decline at this point. Have added little Seroquel + PRN Haldol as well for anxiety and delirium. Morphine drip as above for comfort measures.   Elevated D-dimer due to inflammation.  CTA is negative but D-dimer is rising despite being on Eliquis, has been switched to full dose Lovenox on 10/15/2019.  New onset A. fib RVR.  Due to pulmonary stress. Stable TSH and echocardiogram with EF 60%, currently on oral amiodarone and diltiazem, needed IV Cardizem drip again on 10/16/2019 which I will taper off on 10/17/2019, also received 2 doses of digoxin on 10/16/2019.  Will try and titrate off Cardizem drip today.  Mali vas 2 score of at least 3.  Currently on  Lovenox.  Once D-dimer starts trending down we will switch her back to Eliquis.  GERD.  On PPI.  Mild acute on chr. Grade 1 Diastolic CHF EF [id number] -  Mild acute failure, IV Lasix will be repeated again on 10/18/2019.  Anxiety and delirium.  Low-dose Seroquel added on 10/15/2019, PRN Haldol as well, was worse with Xanax.  Condition -  Guarded, continues on comfort care and Hospice.  Family Communication  : daughter updated 10/13/2019, will call 12/9, 12/10, 12/11, 12/12, 12/13, 12/14, 12/15, 12/17  Code Status :  DNR  Diet :    Diet Order            DIET SOFT Room service appropriate? Yes; Fluid consistency: Thin  Diet effective now              Disposition Plan  : Full comfort care now.  Residential hospice. Procedures  :    CTA - No PE  TTE 1. Left ventricular ejection fraction, by visual estimation, is 55 to 60%. The left ventricle has normal function. There is no left ventricular hypertrophy.  2. Left ventricular diastolic parameters are consistent with Grade I diastolic dysfunction (impaired relaxation).  3. The left ventricle has no regional wall motion abnormalities.  4. Global right ventricle has normal systolic function.The right ventricular size is normal. No increase in right ventricular wall thickness.  5. Left atrial size was normal.  6. Right atrial size was normal.  7. The mitral valve is normal in structure. Trace mitral valve regurgitation. No evidence of mitral stenosis.  8. The tricuspid valve is normal in structure. Tricuspid valve regurgitation is not demonstrated.  9. The aortic valve is tricuspid. Aortic valve regurgitation is mild. Mild aortic valve sclerosis without stenosis. 10. TR signal is inadequate for assessing pulmonary artery systolic pressure. 11. The inferior vena cava is normal in size with greater than 50% respiratory variability, suggesting right atrial pressure of 3 mmHg.   PUD Prophylaxis : PPI  DVT Prophylaxis  :  Lovenox >>  Eliquis  Lab Results  Component Value Date   PLT 203 10/18/2019    Inpatient Medications  Scheduled  Meds: . amiodarone  100 mg Oral Daily  . Chlorhexidine Gluconate Cloth  6 each Topical Daily  . dextromethorphan-guaiFENesin  2 tablet Oral Q12H  . diltiazem  90 mg Oral Q8H  . enoxaparin (LOVENOX) injection  60 mg Subcutaneous Q12H  . feeding supplement (PRO-STAT SUGAR FREE 64)  30 mL Oral TID WC  . mouth rinse  15 mL Mouth Rinse BID  . pantoprazole  40 mg Oral Daily  . QUEtiapine  12.5 mg Oral BID  . sodium chloride flush  3 mL Intravenous Q12H  . traZODone  25 mg Oral QHS   Continuous Infusions: . morphine 10 mg/hr (10/22/19 1450)   PRN Meds:.acetaminophen, albuterol, bisacodyl, diltiazem, glycopyrrolate **OR** glycopyrrolate **OR** glycopyrrolate, haloperidol **OR** haloperidol **OR** haloperidol lactate, haloperidol lactate, LORazepam **OR** LORazepam **OR** LORazepam, morphine, nitroGLYCERIN, [DISCONTINUED] ondansetron **OR** ondansetron (ZOFRAN) IV, sodium chloride  Antibiotics  :    Anti-infectives (From admission, onward)   Start     Dose/Rate Route Frequency Ordered Stop   10/13/19 2215  remdesivir 100 mg in sodium chloride 0.9 % 100 mL IVPB     100 mg 200 mL/hr over 30 Minutes Intravenous Once 10/13/19 2158 10/13/19 2326   10/13/19 1000  remdesivir 100 mg in sodium chloride 0.9 % 100 mL IVPB  Status:  Discontinued     100 mg 200 mL/hr over 30 Minutes Intravenous Daily 10/09/2019 1631 10/09/2019 1641   10/13/19 0600  remdesivir 100 mg in sodium chloride 0.9 % 100 mL IVPB  Status:  Discontinued     100 mg 200 mL/hr over 30 Minutes Intravenous Daily 10/11/2019 1643 10/16/19 1504   10/11/2019 1645  remdesivir 200 mg in sodium chloride 0.9% 250 mL IVPB  Status:  Discontinued     200 mg 580 mL/hr over 30 Minutes Intravenous Once 10/31/2019 1631 10/14/2019 1641       Time Spent in minutes  [employer] DO on 10/22/2019 at 4:56 PM  To page go to [email or web address] -  password [facility]  [facility] -  Office  [phone]  See all Orders from today for further details    Objective:   Vitals:   10/22/19 0545 10/22/19 0715 10/22/19 0900 10/22/19 1600  BP:    105/71  Pulse:   65 67  Resp: 12 12 10 15   Temp:    98 F (36.7 C)  TempSrc:    Oral  SpO2:   (!) 80% 92%  Weight:      Height:        Wt Readings from Last 3 Encounters:  10/13/19 63.6 kg  10/21/2019 68.2 kg  10/06/19 68.2 kg     Intake/Output Summary (Last 24 hours) at 10/22/2019 1656 Last data filed at 10/22/2019 0545 Gross per 24 hour  Intake --  Output 545 ml  Net -545 ml     Physical Exam  Sleeping comfortably in no distress, morphine drip on. Supple Neck,No JVD, No cervical lymphadenopathy appriciated.  Symmetrical Chest wall movement, Good air movement bilaterally, respiratory rate 5 RRR,No Gallops, Rubs or new Murmurs, No Parasternal Heave +ve B.Sounds, Abd Soft, No tenderness, No organomegaly appriciated, No rebound - guarding or rigidity. No Cyanosis, Clubbing or edema, No new Rash or bruise     Data Review:    CBC Recent Labs  Lab 10/16/19 0040 10/17/19 0219 10/18/19 0434  WBC 8.6 7.9 8.1  HGB 12.5 12.0 12.0  HCT 38.9 37.9 38.4  PLT 220 228 203  MCV 89.0 90.0 89.9  MCH 28.6 28.5 28.1  MCHC 32.1 31.7 31.3  RDW 12.7 12.9 12.8  LYMPHSABS 0.3* 0.2* 0.3*  MONOABS 0.2 0.2 0.2  EOSABS 0.0 0.0 0.0  BASOSABS 0.0 0.0 0.0    Chemistries  Recent Labs  Lab 10/16/19 0040 10/17/19 0219 10/18/19 0434  NA 140 143 145  K 4.0 4.4 3.9  CL 102 102 103  CO2 25 28 30   GLUCOSE 172* 190* 166*  BUN 41* 46* 53*  CREATININE 0.65 0.69 0.63  CALCIUM 8.9 8.9 8.9  MG 2.4 2.5* 2.6*  AST 20 23 17   ALT 18 19 18   ALKPHOS 102 102 101  BILITOT 0.5 0.4 0.8   ------------------------------------------------------------------------------------------------------------------ No results for input(s): CHOL, HDL, LDLCALC, TRIG, CHOLHDL, LDLDIRECT in the last 72  hours.  Lab Results  Component Value Date   HGBA1C 5.9 09/11/2019   ------------------------------------------------------------------------------------------------------------------ No results for input(s): TSH, T4TOTAL, T3FREE, THYROIDAB in the last 72 hours.  Invalid input(s): FREET3  Cardiac Enzymes No results for input(s): CKMB, TROPONINI, MYOGLOBIN in the last 168 hours.  Invalid input(s): CK ------------------------------------------------------------------------------------------------------------------    Component Value Date/Time   BNP 46.9 10/18/2019 0434    Micro Results Recent Results (from the past 240 hour(s))  MRSA PCR Screening     Status: None   Collection Time: 10/13/19  5:40 AM   Specimen: Nasopharyngeal  Result Value Ref Range Status   MRSA by PCR NEGATIVE NEGATIVE Final    Comment:        The GeneXpert MRSA Assay (FDA approved for NASAL specimens only), is one component of a comprehensive MRSA colonization surveillance program. It is not intended to diagnose MRSA infection nor to guide or monitor treatment for MRSA infections. Performed at [facility], [place] [address] [place], [place] [address]     Radiology Reports DG Chest 1 View  Result Date: 10/27/2019 CLINICAL DATA:  Shortness of breath.  COVID-19 positive EXAM: CHEST  1 VIEW COMPARISON:  09/26/2019 FINDINGS: Bilateral pulmonary infiltrate. Infiltrates were also seen November 2020, but opacity is denser and more widespread today. No edema, effusion, or pneumothorax. Normal heart size. No acute osseous finding. IMPRESSION: Bilateral pneumonia. Electronically Signed   By: Monte Fantasia M.D.   On: 10/25/2019 05:32   CT ANGIO CHEST PE W OR WO CONTRAST  Result Date: 09/27/2019 CLINICAL DATA:  Multiple falls and feeling fatigued. PE suspected. EXAM: CT ANGIOGRAPHY CHEST WITH CONTRAST TECHNIQUE: Multidetector CT imaging of the chest was performed using the standard protocol  during bolus administration of intravenous contrast. Multiplanar CT image reconstructions and MIPs were obtained to evaluate the vascular anatomy. CONTRAST:  50mL OMNIPAQUE IOHEXOL 350 MG/ML SOLN COMPARISON:  None. FINDINGS: Cardiovascular: Heart is enlarged. No pericardial effusion. Coronary artery calcification is evident. Atherosclerotic calcification is noted in the wall of the thoracic aorta. No filling defect within the opacified pulmonary arteries to suggest the presence of an acute pulmonary embolus. Mediastinum/Nodes: Mediastinal lymph nodes measure up to 8 mm short axis in the pre-vascular window. 9 mm short axis subcarinal lymph node. The esophagus has normal imaging features. Surgical clips are noted in the left axilla. Lungs/Pleura: Lungs demonstrate bilateral patchy ground-glass infiltrates peripherally in both lungs with an upper lung predominance. No pleural effusion. Upper Abdomen: 2.6 cm low-density lesion in the left liver approaches water attenuation and is compatible with a cyst. Musculoskeletal: No worrisome lytic or sclerotic osseous abnormality. Review of the MIP images confirms the above findings. IMPRESSION: 1. No CT evidence for acute pulmonary embolus. 2.  Bilateral patchy ground-glass infiltrates peripherally in both lungs with an upper lung predominance. Imaging features are nonspecific but likely related to infectious/inflammatory etiology and atypical or viral pneumonia would be a consideration. 3. Borderline mediastinal lymphadenopathy, likely reactive. 4. Aortic Atherosclerosis (ICD10-I70.0). Electronically Signed   By: Misty Stanley M.D.   On: 09/27/2019 07:35   DG Chest Port 1 View  Result Date: 09/26/2019 CLINICAL DATA:  Low-grade fever. EXAM: PORTABLE CHEST 1 VIEW COMPARISON:  10/24/2016 FINDINGS: 1300 hours. The cardiopericardial silhouette is within normal limits for size. Interstitial markings are diffusely coarsened with chronic features. Patchy subtle airspace opacities  are seen peripherally in both lungs. No associated pleural effusion period bones are demineralized. IMPRESSION: 1. Patchy subtle airspace opacities in the periphery of both lungs, left greater than right, without pleural effusion. Imaging features compatible with multifocal pneumonia and atypical/viral etiology should be considered. I discussed these findings by telephone with Dr. Jacqualine Code at approximately 13 20 on 09/26/2019. Electronically Signed   By: Misty Stanley M.D.   On: 09/26/2019 13:23   ECHOCARDIOGRAM COMPLETE  Result Date: 10/13/2019   ECHOCARDIOGRAM REPORT   Patient Name:   KLARA WASHER [facility] Date of Exam: 10/13/2019 Medical Rec #:  [id number]               Height:       66.0 in Accession #:    [id number]              Weight:       140.3 lb Date of Birth:  1934/11/17               BSA:          1.72 m Patient Age:    53 years                BP:           113/54 mmHg Patient Gender: F                       HR:           69 bpm. Exam Location:  Inpatient Procedure: 2D Echo, Color Doppler and Cardiac Doppler Indications:    I48.91* Unspecified atrial fibrillation  History:        Patient has prior history of Echocardiogram examinations, most                 recent 04/23/2018. Risk Factors:Hypertension and Dyslipidemia.                 Active COVID-19 Infection at time of study.  Sonographer:    Raquel Sarna Senior RDCS Referring Phys: [place]  1. Left ventricular ejection fraction, by visual estimation, is 55 to 60%. The left ventricle has normal function. There is no left ventricular hypertrophy.  2. Left ventricular diastolic parameters are consistent with Grade I diastolic dysfunction (impaired relaxation).  3. The left ventricle has no regional wall motion abnormalities.  4. Global right ventricle has normal systolic function.The right ventricular size is normal. No increase in right ventricular wall thickness.  5. Left atrial size was normal.  6. Right atrial size was normal.  7.  The mitral valve is normal in structure. Trace mitral valve regurgitation. No evidence of mitral stenosis.  8. The tricuspid valve is normal in structure. Tricuspid valve regurgitation is not demonstrated.  9. The aortic valve is tricuspid. Aortic valve regurgitation is mild. Mild aortic valve sclerosis without stenosis. 10.  TR signal is inadequate for assessing pulmonary artery systolic pressure. 11. The inferior vena cava is normal in size with greater than 50% respiratory variability, suggesting right atrial pressure of 3 mmHg. FINDINGS  Left Ventricle: Left ventricular ejection fraction, by visual estimation, is 55 to 60%. The left ventricle has normal function. The left ventricle has no regional wall motion abnormalities. There is no left ventricular hypertrophy. Left ventricular diastolic parameters are consistent with Grade I diastolic dysfunction (impaired relaxation). Right Ventricle: The right ventricular size is normal. No increase in right ventricular wall thickness. Global RV systolic function is has normal systolic function. Left Atrium: Left atrial size was normal in size. Right Atrium: Right atrial size was normal in size Pericardium: There is no evidence of pericardial effusion. Mitral Valve: The mitral valve is normal in structure. No evidence of mitral valve stenosis by observation. Trace mitral valve regurgitation. Tricuspid Valve: The tricuspid valve is normal in structure. Tricuspid valve regurgitation is not demonstrated. Aortic Valve: The aortic valve is tricuspid. Aortic valve regurgitation is mild. Mild aortic valve sclerosis is present, with no evidence of aortic valve stenosis. Pulmonic Valve: The pulmonic valve was normal in structure. Pulmonic valve regurgitation is not visualized. Aorta: The aortic root is normal in size and structure. Venous: The inferior vena cava is normal in size with greater than 50% respiratory variability, suggesting right atrial pressure of 3 mmHg. IAS/Shunts:  No atrial level shunt detected by color flow Doppler.  LEFT VENTRICLE PLAX 2D LVIDd:         4.35 cm  Diastology LVIDs:         3.17 cm  LV e' lateral:   6.38 cm/s LV PW:         0.98 cm  LV E/e' lateral: 7.3 LV IVS:        0.79 cm  LV e' medial:    4.06 cm/s LVOT diam:     1.80 cm  LV E/e' medial:  11.5 LV SV:         45 ml LV SV Index:   26.28 LVOT Area:     2.54 cm  RIGHT VENTRICLE RV S prime:     13.20 cm/s TAPSE (M-mode): 2.4 cm LEFT ATRIUM             Index       RIGHT ATRIUM           Index LA diam:        2.70 cm 1.57 cm/m  RA Area:     15.50 cm LA Vol (A2C):   41.8 ml 24.30 ml/m RA Volume:   39.40 ml  22.91 ml/m LA Vol (A4C):   27.0 ml 15.70 ml/m LA Biplane Vol: 36.6 ml 21.28 ml/m  AORTIC VALVE LVOT Vmax:   90.90 cm/s LVOT Vmean:  59.400 cm/s LVOT VTI:    0.177 m  AORTA Ao Root diam: 2.80 cm MITRAL VALVE MV Area (PHT): 2.37 cm             SHUNTS MV PHT:        92.80 msec           Systemic VTI:  0.18 m MV Decel Time: 320 msec             Systemic Diam: 1.80 cm MV E velocity: 46.60 cm/s 103 cm/s MV A velocity: 82.90 cm/s 70.3 cm/s MV E/A ratio:  0.56       1.5  Loralie Champagne MD Electronically signed by Loralie Champagne  MD Signature Date/Time: 10/13/2019/3:50:44 PM    Final

## 2019-10-22 NOTE — Progress Notes (Signed)
ANTICOAGULATION CONSULT NOTE - Follow Up Consult  Pharmacy Consult for Eliquis --> LMWH Indication: new onset  atrial fibrillation  Allergies  Allergen Reactions  . Lexapro [Escitalopram Oxalate] Itching    Patient Measurements: Height: 5\' 6"  (167.6 cm) Weight: 140 lb 4.8 oz (63.6 kg) IBW/kg (Calculated) : 59.3 Heparin Dosing Weight:   Vital Signs: Temp: 98.6 F (37 C) (12/17 0415) Temp Source: Axillary (12/17 0415) BP: 121/79 (12/17 0415) Pulse Rate: 85 (12/17 0415)  Labs: No results for input(s): HGB, HCT, PLT, APTT, LABPROT, INR, HEPARINUNFRC, HEPRLOWMOCWT, CREATININE, CKTOTAL, CKMB, TROPONINIHS in the last 72 hours.  Estimated Creatinine Clearance: 50.8 mL/min (by C-G formula based on SCr of 0.63 mg/dL).   Assessment: Patient is an 84 y.o F started on Eliquis on 12/8 for new onset Afib.  D-dimer remains elevated and is trending up.  Pharmacy was consulted on 10/15/19 to transition her to full dose lovenox.  CBC stable and Ddimer down to 6.6 on 12/13.  Goal of Therapy:  Anti-Xa level 0.6-1 units/ml 4hrs after LMWH dose given Monitor platelets by anticoagulation protocol: Yes   Plan:  Continue Lovenox 60mg  [place] Q12h Monitor CBC, s/s of bleed F/U need to transition back to Eliquis if continuing care  May transition to full comfort care if declines any further  Elenor Quinones, PharmD, BCPS, BCIDP Clinical Pharmacist 10/22/2019 8:43 AM

## 2019-11-03 ENCOUNTER — Encounter: Payer: Self-pay | Admitting: *Deleted

## 2019-11-06 NOTE — Death Summary Note (Signed)
Death Summary  Elaine Price [id number] DOB: 13-Feb-1935 DOA: 10-19-2019  PCP: McLean-Scocuzza, Nino Glow, MD  Admit date: 10/19/2019 Date of Death: 2019-10-30 Time of Death: 04:25  Notification: McLean-Scocuzza, Nino Glow, MD notified of death of 10/30/19   History of present illness:  Elaine Price is a 85 y.o. female with a history of breast cancer, hypertension, hyperlipidemia, and anemia of chronic disease who was diagnosed with COVID-19 on 10/10/2019 and admitted to the hospital with acute hypoxemic respiratory failure on 2019-10-19.  She was requiring 15 L/min of high flow nasal cannula oxygen in addition to nonrebreather mask, she was treated with steroids, remdesivir, and Actemra.  Unfortunately, the patient continued to worsen and developed an encephalopathy and later rapid atrial fibrillation.  The patient's family felt that in this situation, the patient would want [place] to focus on comfort rather than cure and the patient was transition to full comfort care on 10/19/2019.  The patient was kept comfortable and eventually expired in the hospital on October 30, 2019.   Final Diagnoses:  1.   COVID-19 pneumonia  2.   Radiation pneumonitis    The results of significant diagnostics from this hospitalization (including imaging, microbiology, ancillary and laboratory) are listed below for reference.    Significant Diagnostic Studies: DG Chest 1 View  Result Date: 2019/10/19 CLINICAL DATA:  Shortness of breath.  COVID-19 positive EXAM: CHEST  1 VIEW COMPARISON:  09/26/2019 FINDINGS: Bilateral pulmonary infiltrate. Infiltrates were also seen November 2020, but opacity is denser and more widespread today. No edema, effusion, or pneumothorax. Normal heart size. No acute osseous finding. IMPRESSION: Bilateral pneumonia. Electronically Signed   By: Monte Fantasia M.D.   On: 10-19-19 05:32   CT ANGIO CHEST PE W OR WO CONTRAST  Result Date: 09/27/2019 CLINICAL DATA:  Multiple  falls and feeling fatigued. PE suspected. EXAM: CT ANGIOGRAPHY CHEST WITH CONTRAST TECHNIQUE: Multidetector CT imaging of the chest was performed using the standard protocol during bolus administration of intravenous contrast. Multiplanar CT image reconstructions and MIPs were obtained to evaluate the vascular anatomy. CONTRAST:  81mL OMNIPAQUE IOHEXOL 350 MG/ML SOLN COMPARISON:  None. FINDINGS: Cardiovascular: Heart is enlarged. No pericardial effusion. Coronary artery calcification is evident. Atherosclerotic calcification is noted in the wall of the thoracic aorta. No filling defect within the opacified pulmonary arteries to suggest the presence of an acute pulmonary embolus. Mediastinum/Nodes: Mediastinal lymph nodes measure up to 8 mm short axis in the pre-vascular window. 9 mm short axis subcarinal lymph node. The esophagus has normal imaging features. Surgical clips are noted in the left axilla. Lungs/Pleura: Lungs demonstrate bilateral patchy ground-glass infiltrates peripherally in both lungs with an upper lung predominance. No pleural effusion. Upper Abdomen: 2.6 cm low-density lesion in the left liver approaches water attenuation and is compatible with a cyst. Musculoskeletal: No worrisome lytic or sclerotic osseous abnormality. Review of the MIP images confirms the above findings. IMPRESSION: 1. No CT evidence for acute pulmonary embolus. 2. Bilateral patchy ground-glass infiltrates peripherally in both lungs with an upper lung predominance. Imaging features are nonspecific but likely related to infectious/inflammatory etiology and atypical or viral pneumonia would be a consideration. 3. Borderline mediastinal lymphadenopathy, likely reactive. 4. Aortic Atherosclerosis (ICD10-I70.0). Electronically Signed   By: Misty Stanley M.D.   On: 09/27/2019 07:35   DG Chest Port 1 View  Result Date: 09/26/2019 CLINICAL DATA:  Low-grade fever. EXAM: PORTABLE CHEST 1 VIEW COMPARISON:  10/24/2016 FINDINGS: 1300  hours. The cardiopericardial silhouette is within normal limits for size. Interstitial  markings are diffusely coarsened with chronic features. Patchy subtle airspace opacities are seen peripherally in both lungs. No associated pleural effusion period bones are demineralized. IMPRESSION: 1. Patchy subtle airspace opacities in the periphery of both lungs, left greater than right, without pleural effusion. Imaging features compatible with multifocal pneumonia and atypical/viral etiology should be considered. I discussed these findings by telephone with Dr. Jacqualine Code at approximately 13 20 on 09/26/2019. Electronically Signed   By: Misty Stanley M.D.   On: 09/26/2019 13:23   ECHOCARDIOGRAM COMPLETE  Result Date: 10/13/2019   ECHOCARDIOGRAM REPORT   Patient Name:   [name] [facility] Date of Exam: 10/13/2019 Medical Rec #:  [id number]               Height:       66.0 in Accession #:    [id number]              Weight:       140.3 lb Date of Birth:  02-11-1935               BSA:          1.72 m Patient Age:    39 years                BP:           113/54 mmHg Patient Gender: F                       HR:           69 bpm. Exam Location:  Inpatient Procedure: 2D Echo, Color Doppler and Cardiac Doppler Indications:    I48.91* Unspecified atrial fibrillation  History:        Patient has prior history of Echocardiogram examinations, most                 recent 04/23/2018. Risk Factors:Hypertension and Dyslipidemia.                 Active COVID-19 Infection at time of study.  Sonographer:    Raquel Sarna Senior RDCS Referring Phys: Palmer Metcalfe  1. Left ventricular ejection fraction, by visual estimation, is 55 to 60%. The left ventricle has normal function. There is no left ventricular hypertrophy.  2. Left ventricular diastolic parameters are consistent with Grade I diastolic dysfunction (impaired relaxation).  3. The left ventricle has no regional wall motion abnormalities.  4. Global right ventricle has normal  systolic function.The right ventricular size is normal. No increase in right ventricular wall thickness.  5. Left atrial size was normal.  6. Right atrial size was normal.  7. The mitral valve is normal in structure. Trace mitral valve regurgitation. No evidence of mitral stenosis.  8. The tricuspid valve is normal in structure. Tricuspid valve regurgitation is not demonstrated.  9. The aortic valve is tricuspid. Aortic valve regurgitation is mild. Mild aortic valve sclerosis without stenosis. 10. TR signal is inadequate for assessing pulmonary artery systolic pressure. 11. The inferior vena cava is normal in size with greater than 50% respiratory variability, suggesting right atrial pressure of 3 mmHg. FINDINGS  Left Ventricle: Left ventricular ejection fraction, by visual estimation, is 55 to 60%. The left ventricle has normal function. The left ventricle has no regional wall motion abnormalities. There is no left ventricular hypertrophy. Left ventricular diastolic parameters are consistent with Grade I diastolic dysfunction (impaired relaxation). Right Ventricle: The right ventricular size is normal. No increase in right ventricular  wall thickness. Global RV systolic function is has normal systolic function. Left Atrium: Left atrial size was normal in size. Right Atrium: Right atrial size was normal in size Pericardium: There is no evidence of pericardial effusion. Mitral Valve: The mitral valve is normal in structure. No evidence of mitral valve stenosis by observation. Trace mitral valve regurgitation. Tricuspid Valve: The tricuspid valve is normal in structure. Tricuspid valve regurgitation is not demonstrated. Aortic Valve: The aortic valve is tricuspid. Aortic valve regurgitation is mild. Mild aortic valve sclerosis is present, with no evidence of aortic valve stenosis. Pulmonic Valve: The pulmonic valve was normal in structure. Pulmonic valve regurgitation is not visualized. Aorta: The aortic root is normal  in size and structure. Venous: The inferior vena cava is normal in size with greater than 50% respiratory variability, suggesting right atrial pressure of 3 mmHg. IAS/Shunts: No atrial level shunt detected by color flow Doppler.  LEFT VENTRICLE PLAX 2D LVIDd:         4.35 cm  Diastology LVIDs:         3.17 cm  LV e' lateral:   6.38 cm/s LV PW:         0.98 cm  LV E/e' lateral: 7.3 LV IVS:        0.79 cm  LV e' medial:    4.06 cm/s LVOT diam:     1.80 cm  LV E/e' medial:  11.5 LV SV:         45 ml LV SV Index:   26.28 LVOT Area:     2.54 cm  RIGHT VENTRICLE RV S prime:     13.20 cm/s TAPSE (M-mode): 2.4 cm LEFT ATRIUM             Index       RIGHT ATRIUM           Index LA diam:        2.70 cm 1.57 cm/m  RA Area:     15.50 cm LA Vol (A2C):   41.8 ml 24.30 ml/m RA Volume:   39.40 ml  22.91 ml/m LA Vol (A4C):   27.0 ml 15.70 ml/m LA Biplane Vol: 36.6 ml 21.28 ml/m  AORTIC VALVE LVOT Vmax:   90.90 cm/s LVOT Vmean:  59.400 cm/s LVOT VTI:    0.177 m  AORTA Ao Root diam: 2.80 cm MITRAL VALVE MV Area (PHT): 2.37 cm             SHUNTS MV PHT:        92.80 msec           Systemic VTI:  0.18 m MV Decel Time: 320 msec             Systemic Diam: 1.80 cm MV E velocity: 46.60 cm/s 103 cm/s MV A velocity: 82.90 cm/s 70.3 cm/s MV E/A ratio:  0.56       1.5  Loralie Champagne MD Electronically signed by Loralie Champagne MD Signature Date/Time: 10/13/2019/3:50:44 PM    Final     Microbiology: No results found for this or any previous visit (from the past 240 hour(s)).   Labs: Basic Metabolic Panel: Recent Labs  Lab 10/17/19 0219 10/18/19 0434  NA 143 145  K 4.4 3.9  CL 102 103  CO2 28 30  GLUCOSE 190* 166*  BUN 46* 53*  CREATININE 0.69 0.63  CALCIUM 8.9 8.9  MG 2.5* 2.6*   Liver Function Tests: Recent Labs  Lab 10/17/19 0219 10/18/19 0434  AST 23  17  ALT 19 18  ALKPHOS 102 101  BILITOT 0.4 0.8  PROT 5.8* 5.9*  ALBUMIN 3.0* 3.2*   No results for input(s): LIPASE, AMYLASE in the last 168 hours. No  results for input(s): AMMONIA in the last 168 hours. CBC: Recent Labs  Lab 10/17/19 0219 10/18/19 0434  WBC 7.9 8.1  NEUTROABS 7.3 7.4  HGB 12.0 12.0  HCT 37.9 38.4  MCV 90.0 89.9  PLT 228 203   Cardiac Enzymes: No results for input(s): CKTOTAL, CKMB, CKMBINDEX, TROPONINI in the last 168 hours. D-Dimer No results for input(s): DDIMER in the last 72 hours. BNP: Invalid input(s): POCBNP CBG: No results for input(s): GLUCAP in the last 168 hours. Anemia work up No results for input(s): VITAMINB12, FOLATE, FERRITIN, TIBC, IRON, RETICCTPCT in the last 72 hours. Urinalysis    Component Value Date/Time   COLORURINE YELLOW (A) 09/26/2019 1801   APPEARANCEUR HAZY (A) 09/26/2019 1801   LABSPEC 1.010 09/26/2019 1801   PHURINE 5.0 09/26/2019 1801   GLUCOSEU NEGATIVE 09/26/2019 1801   GLUCOSEU NEGATIVE 01/13/2013 0829   HGBUR NEGATIVE 09/26/2019 1801   BILIRUBINUR NEGATIVE 09/26/2019 1801   BILIRUBINUR neg 07/04/2011 1448   KETONESUR 5 (A) 09/26/2019 1801   PROTEINUR NEGATIVE 09/26/2019 1801   UROBILINOGEN 0.2 01/13/2013 0829   NITRITE NEGATIVE 09/26/2019 1801   LEUKOCYTESUR NEGATIVE 09/26/2019 1801   Sepsis Labs Invalid input(s): PROCALCITONIN,  WBC,  LACTICIDVEN     SIGNED:  Vianne Bulls, MD  [facility] 11-16-19, 5:43 AM Pager   If 7PM-7AM, please contact night-coverage [email or web address] Password [email or web address]

## 2019-11-06 NOTE — Progress Notes (Signed)
Noted absence of respirations and heart beat.   2 RN's pronounced: Bernette Redbird, RN and Dorothyann Peng, RN

## 2019-11-06 NOTE — Discharge Summary (Signed)
Discharge Summary                                                                                                                                                                                                             Patient Demographics:    Elaine Price, is a 85 y.o. female, DOB - 09-07-1935, [id number]  Outpatient Primary MD for the patient is McLean-Scocuzza, Nino Glow, MD    LOS - 11  Admit date - 10/15/2019    CC - SOB     Brief Narrative  -  Elaine Price  is a 85 y.o. female, with history significant for breast cancer, essential hypertension, dyslipidemia, anemia of chronic disease, diverticulosis without diverticulitis, recent admission for pneumonia.  Patient otherwise is quite healthy and independent still lives alone and drives, has been sick for around a month now.  Was recently admitted for pneumonia to [facility] hospital and discharged few weeks ago, she was diagnosed recently in the outpatient setting with COVID-19 infection 2 days prior to her ER visit, her daughter also tested positive.  In the [place] ER she was diagnosed with acute hypoxic respiratory failure due to COVID-19 pneumonitis, she was placed on a nonrebreather mask, she was started on IV steroids and remdesivir and transferred here to [facility] for further care. Patient was admitted to [facility] on 15 L nasal cannula oxygen plus nonrebreather mask, she was appropriately treated, unfortunately her disease and parenchymal damage had progressed too far before she presented here.  She also developed A. fib with RVR, eventually developed encephalopathy and started refusing to eat or drink.  Multiple discussions were made by family members and finally she was transitioned to full comfort care on 10/19/2019.  Patient was made comfort measures by previous physician per family discussion, patient ultimately passed away at 4:25 AM 10-27-19.  Family was notified overnight by overnight physician.   Assessment  & Plan :    Patient continued on comfort measures, time of death overnight on 2019-10-27 at 4:25 AM  Previous active problem list:  Acute Hypoxic Resp. Failure due to Acute Covid 19 Viral Pneumonitis during the ongoing 2020 Covid 19 Pandemic   Elevated D-dimer due to inflammation.  New onset A. fib RVR.    GERD.    Mild acute on chr. Grade 1 Diastolic CHF EF [id number]   Anxiety and delirium.   Time Spent in minutes  39minutes   Ramsie Ostrander C Gio Janoski DO on 2019-10-27 at 9:07 AM  To page go to [email or web address] - password [email or web address]  [facility] -  Office  [phone]  See all Orders from today for further details

## 2019-11-06 DEATH — deceased

## 2019-11-11 ENCOUNTER — Other Ambulatory Visit: Payer: Medicare Other

## 2019-11-11 ENCOUNTER — Ambulatory Visit: Payer: Medicare Other | Admitting: Hematology and Oncology

## 2019-12-10 ENCOUNTER — Other Ambulatory Visit: Payer: Self-pay | Admitting: Hematology and Oncology

## 2019-12-10 DIAGNOSIS — Z170 Estrogen receptor positive status [ER+]: Secondary | ICD-10-CM

## 2019-12-10 DIAGNOSIS — D0511 Intraductal carcinoma in situ of right breast: Secondary | ICD-10-CM

## 2019-12-10 DIAGNOSIS — C50412 Malignant neoplasm of upper-outer quadrant of left female breast: Secondary | ICD-10-CM

## 2019-12-23 IMAGING — CT CT ANGIO CHEST
2 of 6 series · 18 of 46 positions shown · IV contrast (APPLIED)
Comparison: None.

CLINICAL DATA: Multiple falls and feeling fatigued. PE suspected.

EXAM:
CT ANGIOGRAPHY CHEST WITH CONTRAST
TECHNIQUE: Multidetector CT imaging of the chest was performed using the
standard protocol during bolus administration of intravenous
contrast. Multiplanar CT image reconstructions and MIPs were
obtained to evaluate the vascular anatomy.
CONTRAST:  75mL OMNIPAQUE IOHEXOL 350 MG/ML SOLN

[Series 5: thins · axial · 0.61mm/px · z∈[-959,-707]mm · 15 of 277 slices shown]
[im 13/277  lung]
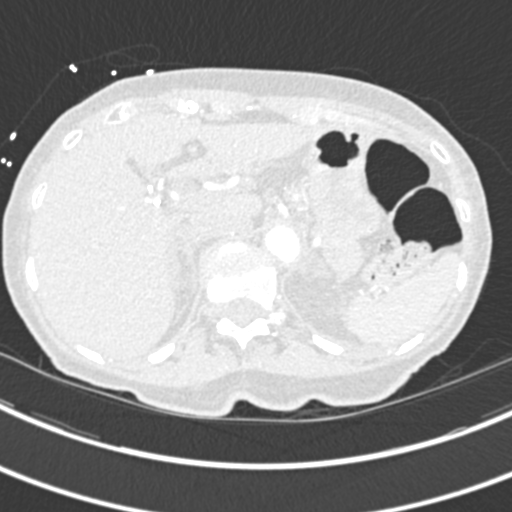
[im 37/277  soft-tissue]
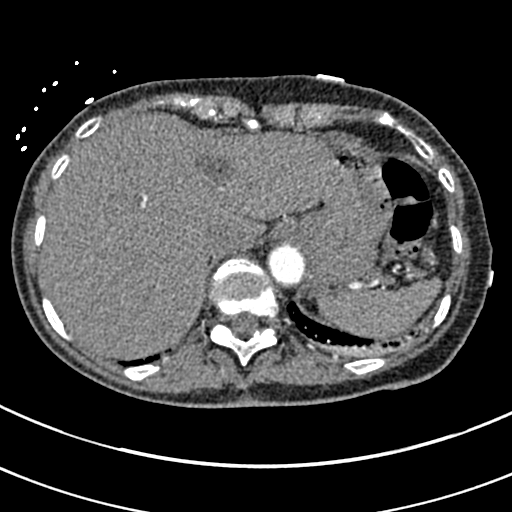
[im 49/277  lung]
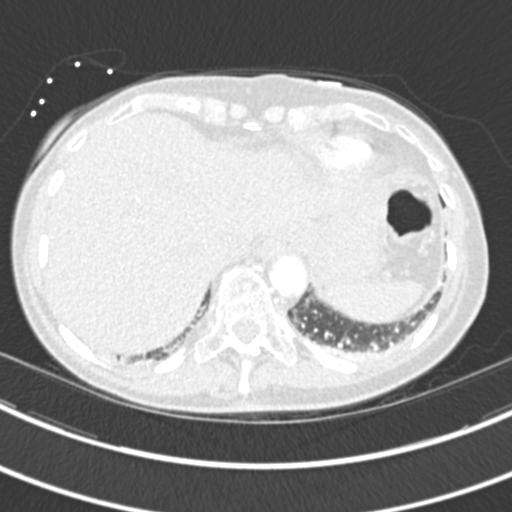
[im 73/277  soft-tissue]
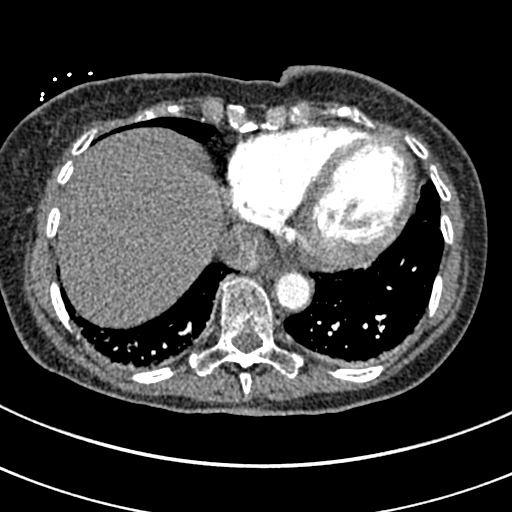
[im 85/277  lung]
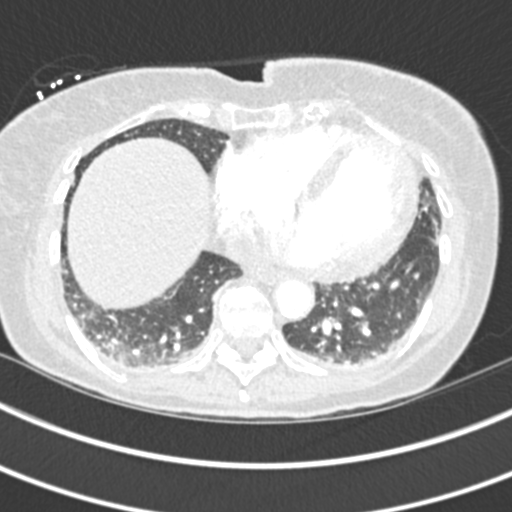
[im 109/277  soft-tissue]
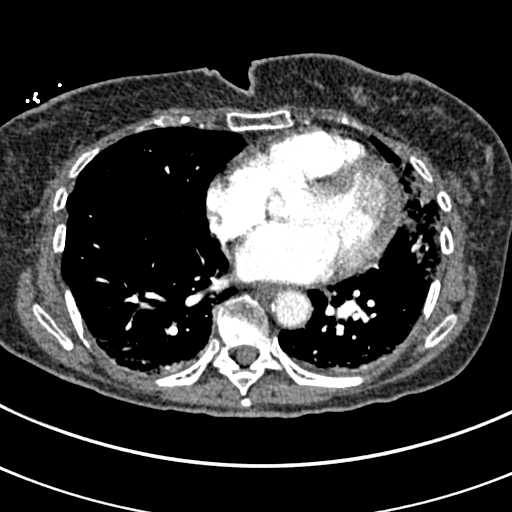
[im 121/277  lung]
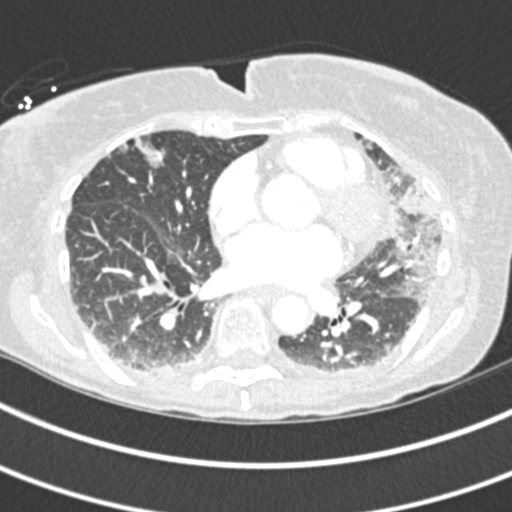
[im 145/277  soft-tissue]
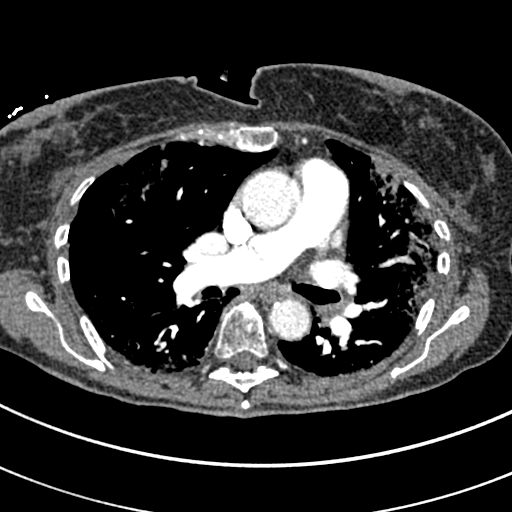
[im 157/277  lung]
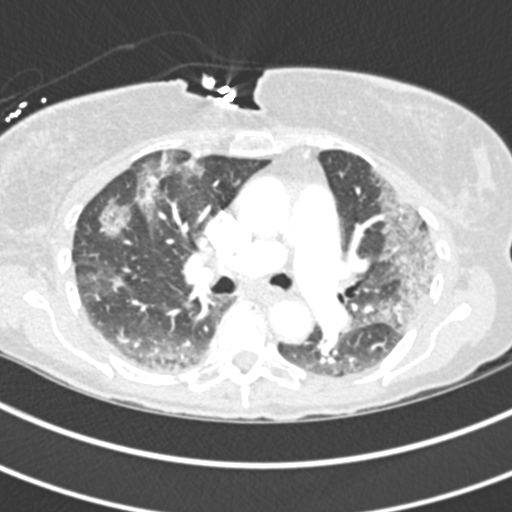
[im 169/277  soft-tissue]
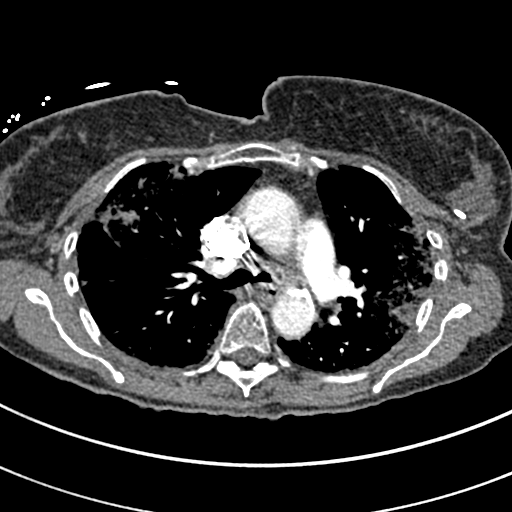
[im 193/277  lung]
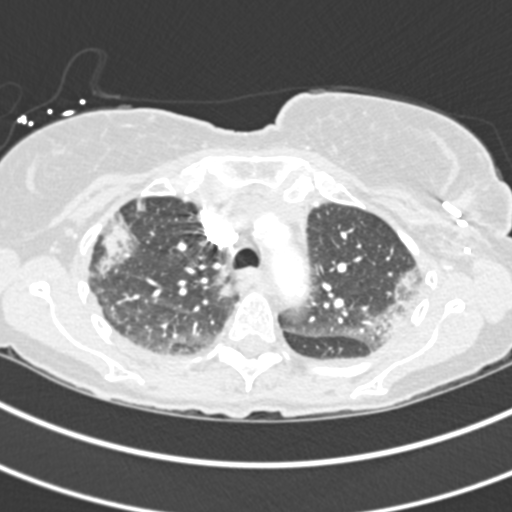
[im 205/277  soft-tissue]
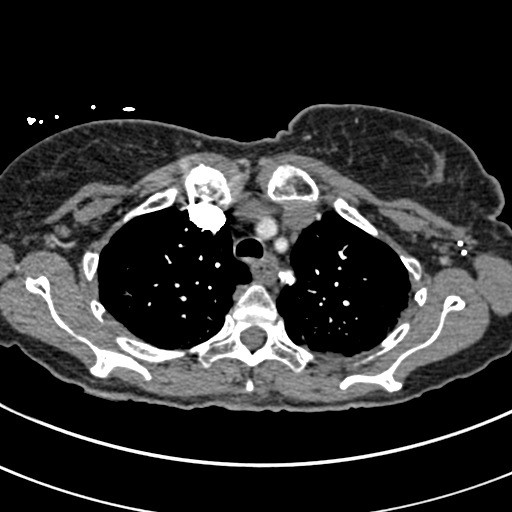
[im 229/277  lung]
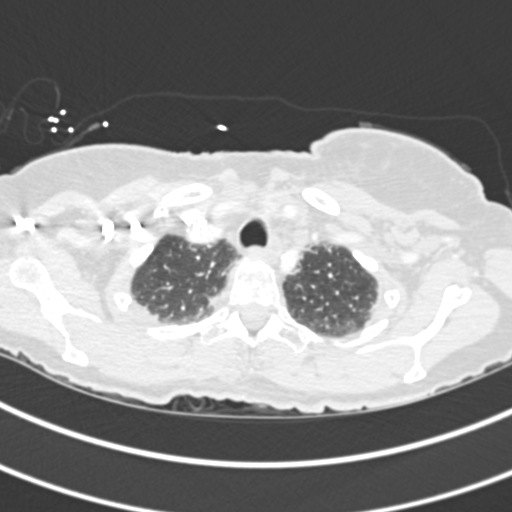
[im 241/277  soft-tissue]
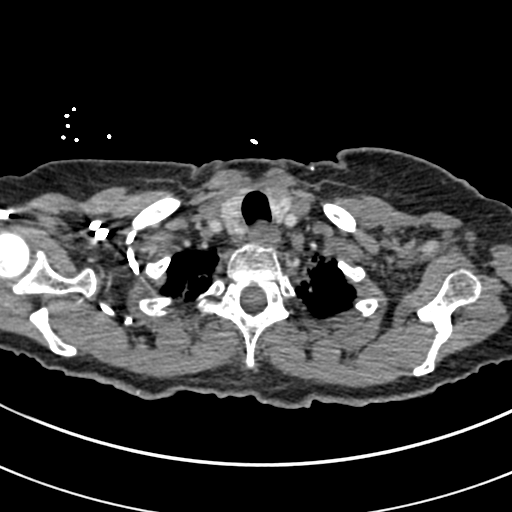
[im 265/277  lung]
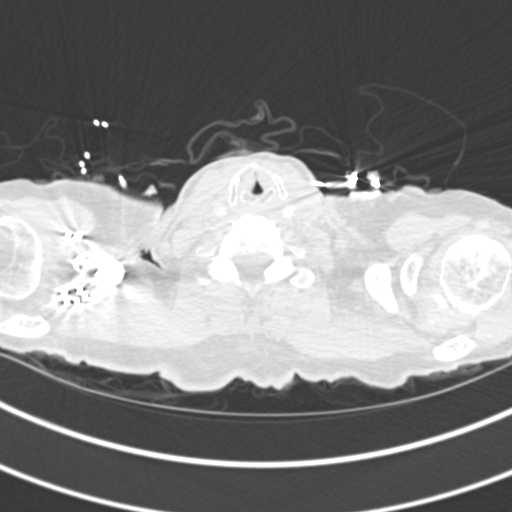

[Series 7: coronal mpr · coronal · 0.57mm/px · 3 of 106 slices shown]
[im 27/106  soft-tissue]
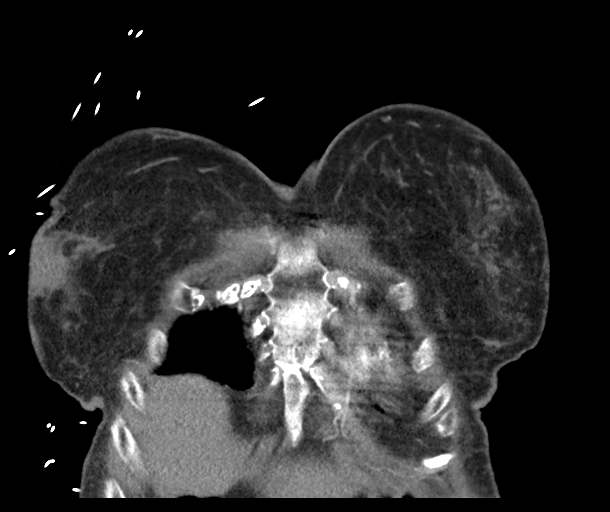
[im 53/106  soft-tissue]
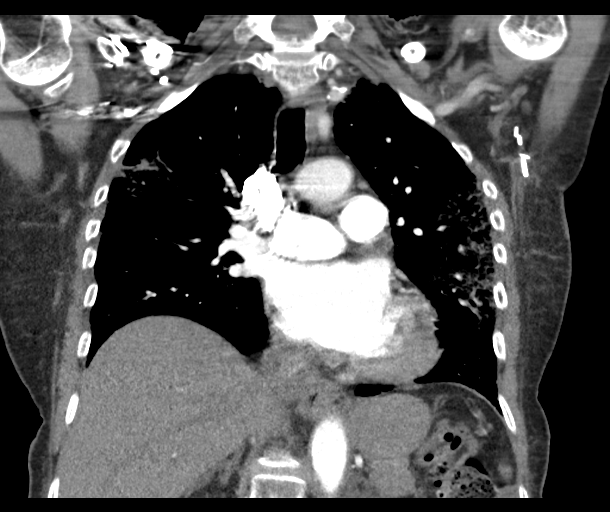
[im 79/106  soft-tissue]
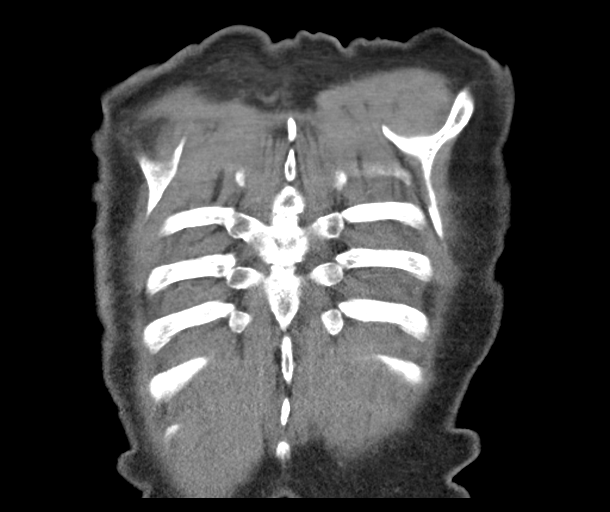

[18 of 46 positions shown; findings below may reference images not displayed]

FINDINGS: Cardiovascular: Heart is enlarged. No pericardial effusion. Coronary
artery calcification is evident. Atherosclerotic calcification is
noted in the wall of the thoracic aorta. No filling defect within
the opacified pulmonary arteries to suggest the presence of an acute
pulmonary embolus.

Mediastinum/Nodes: Mediastinal lymph nodes measure up to 8 mm short
axis in the pre-vascular window. 9 mm short axis subcarinal lymph
node. The esophagus has normal imaging features. Surgical clips are
noted in the left axilla.

Lungs/Pleura: Lungs demonstrate bilateral patchy ground-glass
infiltrates peripherally in both lungs with an upper lung
predominance. No pleural effusion.

Upper Abdomen: 2.6 cm low-density lesion in the left liver
approaches water attenuation and is compatible with a cyst.

Musculoskeletal: No worrisome lytic or sclerotic osseous
abnormality.

Review of the MIP images confirms the above findings.
IMPRESSION: 1. No CT evidence for acute pulmonary embolus.
2. Bilateral patchy ground-glass infiltrates peripherally in both
lungs with an upper lung predominance. Imaging features are
nonspecific but likely related to infectious/inflammatory etiology
and atypical or viral pneumonia would be a consideration.
3. Borderline mediastinal lymphadenopathy, likely reactive.
4. Aortic Atherosclerosis ([id number]).

## 2020-01-05 ENCOUNTER — Ambulatory Visit: Payer: Medicare Other | Admitting: Internal Medicine

## 2020-03-15 ENCOUNTER — Ambulatory Visit: Payer: Medicare Other | Admitting: Internal Medicine

## 2022-10-16 NOTE — Telephone Encounter (Signed)
This encounter was created in error - please disregard.
